# Patient Record
Sex: Female | Born: 1990 | Race: White | Hispanic: No | Marital: Married | Smoking: Former smoker
Health system: Southern US, Community
[De-identification: ages and names within clinical notes are randomized; demographics above are authoritative.]

## PROBLEM LIST (undated history)

## (undated) DIAGNOSIS — F988 Other specified behavioral and emotional disorders with onset usually occurring in childhood and adolescence: Secondary | ICD-10-CM

## (undated) DIAGNOSIS — I10 Essential (primary) hypertension: Secondary | ICD-10-CM

## (undated) DIAGNOSIS — F419 Anxiety disorder, unspecified: Secondary | ICD-10-CM

## (undated) DIAGNOSIS — F32A Depression, unspecified: Secondary | ICD-10-CM

## (undated) DIAGNOSIS — F329 Major depressive disorder, single episode, unspecified: Secondary | ICD-10-CM

## (undated) HISTORY — DX: Other specified behavioral and emotional disorders with onset usually occurring in childhood and adolescence: F98.8

## (undated) HISTORY — DX: Depression, unspecified: F32.A

## (undated) HISTORY — DX: Major depressive disorder, single episode, unspecified: F32.9

## (undated) HISTORY — PX: TONSILLECTOMY: SUR1361

---
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
## 2011-09-22 ENCOUNTER — Emergency Department (HOSPITAL_COMMUNITY)
Admission: EM | Admit: 2011-09-22 | Discharge: 2011-09-23 | Disposition: A | Discharge status: Home / Self Care | Payer: Self-pay | Attending: Emergency Medicine | Admitting: Emergency Medicine

## 2011-09-22 ENCOUNTER — Encounter (HOSPITAL_COMMUNITY): Payer: Self-pay | Admitting: Emergency Medicine

## 2011-09-22 ENCOUNTER — Emergency Department (HOSPITAL_COMMUNITY): Payer: Self-pay

## 2011-09-22 DIAGNOSIS — R197 Diarrhea, unspecified: Secondary | ICD-10-CM | POA: Insufficient documentation

## 2011-09-22 DIAGNOSIS — R42 Dizziness and giddiness: Secondary | ICD-10-CM | POA: Insufficient documentation

## 2011-09-22 DIAGNOSIS — R55 Syncope and collapse: Secondary | ICD-10-CM | POA: Insufficient documentation

## 2011-09-22 LAB — URINALYSIS, ROUTINE W REFLEX MICROSCOPIC
Glucose, UA: NEGATIVE mg/dL
Leukocytes, UA: NEGATIVE
Protein, ur: NEGATIVE mg/dL
pH: 7 (ref 5.0–8.0)

## 2011-09-22 LAB — PREGNANCY, URINE: Preg Test, Ur: NEGATIVE

## 2011-09-22 LAB — CBC
MCHC: 34 g/dL (ref 30.0–36.0)
Platelets: 443 10*3/uL — ABNORMAL HIGH (ref 150–400)
RDW: 13.4 % (ref 11.5–15.5)
WBC: 16 10*3/uL — ABNORMAL HIGH (ref 4.0–10.5)

## 2011-09-22 LAB — DIFFERENTIAL
Basophils Absolute: 0.1 10*3/uL (ref 0.0–0.1)
Basophils Relative: 0 % (ref 0–1)
Lymphocytes Relative: 28 % (ref 12–46)
Neutro Abs: 10.3 10*3/uL — ABNORMAL HIGH (ref 1.7–7.7)

## 2011-09-22 LAB — URINE MICROSCOPIC-ADD ON

## 2011-09-22 LAB — COMPREHENSIVE METABOLIC PANEL
ALT: 12 U/L (ref 0–35)
AST: 15 U/L (ref 0–37)
Albumin: 4 g/dL (ref 3.5–5.2)
CO2: 25 mEq/L (ref 19–32)
Calcium: 9.9 mg/dL (ref 8.4–10.5)
Chloride: 100 mEq/L (ref 96–112)
GFR calc non Af Amer: >90 mL/min (ref >90)
Sodium: 137 mEq/L (ref 135–145)
Total Bilirubin: 0.2 mg/dL — ABNORMAL LOW (ref 0.3–1.2)

## 2011-09-22 MED ORDER — SODIUM CHLORIDE 0.9 % IV BOLUS (SEPSIS)
1000.0000 mL | Freq: Once | INTRAVENOUS | Status: AC
  Administered 2011-09-22: 1000 mL via INTRAVENOUS

## 2011-09-22 MED ORDER — SODIUM CHLORIDE 0.9 % IV SOLN
INTRAVENOUS | Status: DC
  Administered 2011-09-22: 23:00:00 via INTRAVENOUS

## 2011-09-22 MED ORDER — LORAZEPAM 1 MG PO TABS
0.5000 mg | ORAL_TABLET | Freq: Once | ORAL | Status: AC
  Administered 2011-09-22: 0.5 mg via ORAL
  Filled 2011-09-22: qty 1

## 2011-09-22 MED ORDER — POTASSIUM CHLORIDE 20 MEQ/15ML (10%) PO LIQD
40.0000 meq | Freq: Once | ORAL | Status: AC
  Administered 2011-09-22: 40 meq via ORAL
  Filled 2011-09-22: qty 30

## 2011-09-22 MED ORDER — ONDANSETRON HCL 4 MG/2ML IJ SOLN: 4.0000 mg | INTRAMUSCULAR | Status: DC | PRN

## 2011-09-22 NOTE — ED Notes (Signed)
Patient denies nausea at this time. Tolerating fluids well

## 2011-09-22 NOTE — ED Notes (Signed)
Pt presents to the ED with sore throat, near syncope, chills, and diarrhea 30 minutes pta.

## 2011-09-22 NOTE — ED Notes (Signed)
Patient asking if she can get something for lightheadedness. States she hates the way she feels right now.

## 2011-09-22 NOTE — ED Provider Notes (Addendum)
History   This chart was scribed for Laray Anger, DO by Brooks Sailors. The patient was seen in room APA09/APA09.   CSN: [id number]  Arrival date & time 09/22/11  2056   First MD Initiated Contact with Patient 09/22/11 2106      Chief Complaint  Patient presents with  . Chills  . Near Syncope  . Diarrhea    HPI Pt was seen at 2110.   Per pt, c/o gradual onset and persistence of constant lightheadedness, "chills," and "diarrhea" since yesterday.  Has been associated generalized weakness and fatigue.  States she also had a sore throat 3 days ago.  Denies N/V, no syncope, no abd pain, no CP, no cough.     History reviewed. No pertinent past medical history.  Past Surgical History  Procedure Date  . Tonsillectomy     History  Substance Use Topics  . Smoking status: Current Everyday Smoker -- 1.0 packs/day    Types: Cigarettes  . Smokeless tobacco: Not on file  . Alcohol Use: No    Review of Systems ROS: Statement: All systems negative except as marked or noted in the HPI; Constitutional: Negative for fever and +chills. ; ; Eyes: Negative for eye pain, redness and discharge. ; ; ENMT: Negative for ear pain, hoarseness, nasal congestion, sinus pressure and +sore throat. ; ; Cardiovascular: Negative for chest pain, palpitations, diaphoresis, dyspnea and peripheral edema. ; ; Respiratory: Negative for cough, wheezing and stridor. ; ; Gastrointestinal: +diarrhea. Negative for nausea, vomiting, abdominal pain, blood in stool, hematemesis, jaundice and rectal bleeding. . ; ; Genitourinary: Negative for dysuria, flank pain and hematuria. ; ; Musculoskeletal: Negative for back pain and neck pain. Negative for swelling and trauma.; ; Skin: Negative for pruritus, rash, abrasions, blisters, bruising and skin lesion.; ; Neuro: +generalized weakness/fatigue, lightheadedness.  Negative for headache and neck stiffness. Negative for altered level of consciousness , altered mental status,  extremity weakness, paresthesias, involuntary movement, seizure and syncope.      Allergies  Review of patient's allergies indicates no known allergies.  Home Medications  No current outpatient prescriptions on file.  BP 157/108  Pulse 136  Temp(Src) 98.8 F (37.1 C) (Oral)  Resp 20  Ht 5\' 4"  (1.626 m)  Wt 223 lb (101.152 kg)  BMI 38.28 kg/m2  SpO2 100%  LMP 09/20/2011  Physical Exam 2115: Physical examination:  Nursing notes reviewed; Vital signs and O2 SAT reviewed;  Constitutional: Well developed, Well nourished, In no acute distress; Head:  Normocephalic, atraumatic; Eyes: EOMI, PERRL, No scleral icterus; ENMT: Mouth and pharynx normal, Mucous membranes dry; Neck: Supple, Full range of motion, No lymphadenopathy; Cardiovascular: Tachycardic rate and rhythm, No murmur, rub, or gallop; Respiratory: Breath sounds clear & equal bilaterally, No rales, rhonchi, wheezes, or rub, Normal respiratory effort/excursion; Chest: Nontender, Movement normal; Abdomen: Soft, Nontender, Nondistended, Normal bowel sounds; Extremities: Pulses normal, No tenderness, No edema, No calf edema or asymmetry.; Neuro: AA&Ox3, Major CN grossly intact. Speech clear, no facial droop. No gross focal motor or sensory deficits in extremities.; Skin: Color normal, Warm, Dry, no rash.; Psych:  Anxious.    ED Course  Procedures   MDM  MDM Reviewed: nursing note and vitals Interpretation: labs, x-ray and ECG    Date: 09/23/2011  Rate: 121  Rhythm: sinus tachycardia  QRS Axis: normal  Intervals: normal  ST/T Wave abnormalities: normal  Conduction Disutrbances:none  Narrative Interpretation:   Old EKG Reviewed: none available.  Results for orders placed during the hospital  encounter of 09/22/11  CBC      Component Value Range   WBC 16.0 (*) 4.0 - 10.5 (K/uL)   RBC 4.91  3.87 - 5.11 (MIL/uL)   Hemoglobin 13.7  12.0 - 15.0 (g/dL)   HCT 45.4  09.8 - 11.9 (%)   MCV 82.1  78.0 - 100.0 (fL)   MCH 27.9   26.0 - 34.0 (pg)   MCHC 34.0  30.0 - 36.0 (g/dL)   RDW 14.7  82.9 - 56.2 (%)   Platelets 443 (*) 150 - 400 (K/uL)  DIFFERENTIAL      Component Value Range   Neutrophils Relative 65  43 - 77 (%)   Neutro Abs 10.3 (*) 1.7 - 7.7 (K/uL)   Lymphocytes Relative 28  12 - 46 (%)   Lymphs Abs 4.5 (*) 0.7 - 4.0 (K/uL)   Monocytes Relative 6  3 - 12 (%)   Monocytes Absolute 0.9  0.1 - 1.0 (K/uL)   Eosinophils Relative 1  0 - 5 (%)   Eosinophils Absolute 0.2  0.0 - 0.7 (K/uL)   Basophils Relative 0  0 - 1 (%)   Basophils Absolute 0.1  0.0 - 0.1 (K/uL)  COMPREHENSIVE METABOLIC PANEL      Component Value Range   Sodium 137  135 - 145 (mEq/L)   Potassium 3.3 (*) 3.5 - 5.1 (mEq/L)   Chloride 100  96 - 112 (mEq/L)   CO2 25  19 - 32 (mEq/L)   Glucose, Bld 114 (*) 70 - 99 (mg/dL)   BUN 8  6 - 23 (mg/dL)   Creatinine, Ser 1.30  0.50 - 1.10 (mg/dL)   Calcium 9.9  8.4 - 86.5 (mg/dL)   Total Protein 7.6  6.0 - 8.3 (g/dL)   Albumin 4.0  3.5 - 5.2 (g/dL)   AST 15  0 - 37 (U/L)   ALT 12  0 - 35 (U/L)   Alkaline Phosphatase 76  39 - 117 (U/L)   Total Bilirubin 0.2 (*) 0.3 - 1.2 (mg/dL)   GFR calc non Af Amer >90  >90 (mL/min)   GFR calc Af Amer >90  >90 (mL/min)  LIPASE, BLOOD      Component Value Range   Lipase 22  11 - 59 (U/L)  URINALYSIS, ROUTINE W REFLEX MICROSCOPIC      Component Value Range   Color, Urine STRAW (*) YELLOW    APPearance CLEAR  CLEAR    Specific Gravity, Urine 1.010  1.005 - 1.030    pH 7.0  5.0 - 8.0    Glucose, UA NEGATIVE  NEGATIVE (mg/dL)   Hgb urine dipstick MODERATE (*) NEGATIVE    Bilirubin Urine NEGATIVE  NEGATIVE    Ketones, ur NEGATIVE  NEGATIVE (mg/dL)   Protein, ur NEGATIVE  NEGATIVE (mg/dL)   Urobilinogen, UA 0.2  0.0 - 1.0 (mg/dL)   Nitrite NEGATIVE  NEGATIVE    Leukocytes, UA NEGATIVE  NEGATIVE   PREGNANCY, URINE      Component Value Range   Preg Test, Ur NEGATIVE  NEGATIVE   RAPID STREP SCREEN      Component Value Range   Streptococcus, Group A  Screen (Direct) NEGATIVE  NEGATIVE   URINE MICROSCOPIC-ADD ON      Component Value Range   Squamous Epithelial / LPF RARE  RARE    WBC, UA 0-2  <3 (WBC/hpf)   RBC / HPF 0-2  <3 (RBC/hpf)   Dg Chest 2 View 09/22/2011  *RADIOLOGY REPORT*  Clinical Data:  Dizziness, weakness and tachycardia; history of smoking.  CHEST - 2 VIEW  Comparison: None.  Findings: The lungs are well-aerated and clear.  There is no evidence of focal opacification, pleural effusion or pneumothorax.  The heart is normal in size; the mediastinal contour is within normal limits.  No acute osseous abnormalities are seen.  IMPRESSION: No acute cardiopulmonary process seen.  Original Report Authenticated By: Tonia Ghent, M.D.     2300:  Pt continues tachycardic, HR 100-110's, monitor sinus tachycardia, despite IVF x2L.  Pt states she "feels like I'm going to pass out" and "SOB."  Appears anxious, and does admit to feeling "scared."  Will check EKG and CT-A chest r/o PE as cause for tachycardia.     0005:  Pt has tol PO well while in the ED.  Potassium repleted PO.  No stooling while in the ED.  No hypotension, no fevers, and pt is not orthostatic.  CT pending.  Sign out to Dr. Colon Branch.        I personally performed the services described in this documentation, which was scribed in my presence. The recorded information has been reviewed and considered. Rhianon Zabawa Allison Quarry, DO 09/24/11 1610

## 2011-09-23 MED ORDER — IOHEXOL 350 MG/ML SOLN
100.0000 mL | Freq: Once | INTRAVENOUS | Status: AC | PRN
  Administered 2011-09-23: 100 mL via INTRAVENOUS

## 2011-09-23 NOTE — Progress Notes (Signed)
0005 Assumed care/disposition of patient who presented with nausea, diarrhea, anxiety, shortness of breath and persistent tachycardia. Awaiting CTA to r/o PE. 0106 CT negative for PE. Patient is feeling much better. States she does not feel as nervous. Up walking in the room without dizziness. Reviewed results with both the patient and her mother. Patient feels ready for discharge.HR 102.  Dg Chest 2 View  09/22/2011  *RADIOLOGY REPORT*  Clinical Data: Dizziness, weakness and tachycardia; history of smoking.  CHEST - 2 VIEW  Comparison: None.  Findings: The lungs are well-aerated and clear.  There is no evidence of focal opacification, pleural effusion or pneumothorax.  The heart is normal in size; the mediastinal contour is within normal limits.  No acute osseous abnormalities are seen.  IMPRESSION: No acute cardiopulmonary process seen.  Original Report Authenticated By: Tonia Ghent, M.D.   Ct Angio Chest W/cm &/or Wo Cm  09/23/2011  *RADIOLOGY REPORT*  Clinical Data: Shortness of breath.  Near-syncope.  Weakness.  CT ANGIOGRAPHY CHEST  Technique:  Multidetector CT imaging of the chest using the standard protocol during bolus administration of intravenous contrast. Multiplanar reconstructed images including MIPs were obtained and reviewed to evaluate the vascular anatomy.  Contrast: OMNIPAQUE IOHEXOL 350 MG/ML SOLN  Comparison: None.  Findings: Image quality is degraded by technical factors related to the patient's large body habitus.  Although images are noisy, no filling defect can be identified within the opacified pulmonary arteries to suggest the presence of an acute pulmonary embolus. There is no thoracic aortic aneurysm.  No evidence for dissection of the thoracic aorta.  No axillary, mediastinal, or hilar lymphadenopathy.  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  Heart size is normal.  There is no pericardial or pleural effusion.  Lungs are clear  bilaterally.  Bone windows reveal no worrisome lytic or sclerotic osseous lesions.  IMPRESSION: No CT evidence for acute pulmonary embolus.  No findings to explain the patients history of shortness of breath.  Original Report Authenticated By: ERIC A. MANSELL, M.D.

## 2011-09-23 NOTE — Discharge Instructions (Signed)
Your blood work here tonight was normal. Your chest xray and your chest CT did NOT show pneumonia, bronchitis or any evidence of blood clots. Drink lots of fluids. Make position changes slowly if you are feeling dizzy. Use the BRAT diet to help with diarrhea. Follow up with your doctor.   B.R.A.T. Diet Your doctor has recommended the B.R.A.T. diet for you or your child until the condition improves. This is often used to help control diarrhea and vomiting symptoms. If you or your child can tolerate clear liquids, you may have:  Bananas.   Rice.   Applesauce.   Toast (and other simple starches such as crackers, potatoes, noodles).  Be sure to avoid dairy products, meats, and fatty foods until symptoms are better. Fruit juices such as apple, grape, and prune juice can make diarrhea worse. Avoid these. Continue this diet for 2 days or as instructed by your caregiver. Document Released: 05/06/2005 Document Revised: 04/25/2011 Document Reviewed: 10/23/2006 [facility] Patient Information 2012 [place], [place].

## 2011-09-25 ENCOUNTER — Emergency Department (HOSPITAL_COMMUNITY)
Admission: EM | Admit: 2011-09-25 | Discharge: 2011-09-25 | Disposition: A | Discharge status: Home / Self Care | Payer: Self-pay | Attending: Emergency Medicine | Admitting: Emergency Medicine

## 2011-09-25 ENCOUNTER — Encounter (HOSPITAL_COMMUNITY): Payer: Self-pay | Admitting: *Deleted

## 2011-09-25 DIAGNOSIS — F17200 Nicotine dependence, unspecified, uncomplicated: Secondary | ICD-10-CM | POA: Insufficient documentation

## 2011-09-25 DIAGNOSIS — R197 Diarrhea, unspecified: Secondary | ICD-10-CM | POA: Insufficient documentation

## 2011-09-25 DIAGNOSIS — R6883 Chills (without fever): Secondary | ICD-10-CM | POA: Insufficient documentation

## 2011-09-25 DIAGNOSIS — R110 Nausea: Secondary | ICD-10-CM | POA: Insufficient documentation

## 2011-09-25 DIAGNOSIS — R5383 Other fatigue: Secondary | ICD-10-CM | POA: Insufficient documentation

## 2011-09-25 DIAGNOSIS — R5381 Other malaise: Secondary | ICD-10-CM | POA: Insufficient documentation

## 2011-09-25 LAB — CBC
Hemoglobin: 13 g/dL (ref 12.0–15.0)
MCH: 28.3 pg (ref 26.0–34.0)
MCHC: 34.1 g/dL (ref 30.0–36.0)
Platelets: 354 10*3/uL (ref 150–400)
RDW: 13.3 % (ref 11.5–15.5)

## 2011-09-25 LAB — URINALYSIS, ROUTINE W REFLEX MICROSCOPIC
Bilirubin Urine: NEGATIVE
Glucose, UA: NEGATIVE mg/dL
Ketones, ur: NEGATIVE mg/dL
Leukocytes, UA: NEGATIVE
Protein, ur: NEGATIVE mg/dL

## 2011-09-25 LAB — DIFFERENTIAL
Basophils Absolute: 0.1 10*3/uL (ref 0.0–0.1)
Basophils Relative: 1 % (ref 0–1)
Eosinophils Absolute: 0.1 10*3/uL (ref 0.0–0.7)
Neutro Abs: 8.2 10*3/uL — ABNORMAL HIGH (ref 1.7–7.7)
Neutrophils Relative %: 69 % (ref 43–77)

## 2011-09-25 LAB — CLOSTRIDIUM DIFFICILE BY PCR: Toxigenic C. Difficile by PCR: NEGATIVE

## 2011-09-25 LAB — COMPREHENSIVE METABOLIC PANEL
Albumin: 3.9 g/dL (ref 3.5–5.2)
Alkaline Phosphatase: 68 U/L (ref 39–117)
BUN: 8 mg/dL (ref 6–23)
Chloride: 103 mEq/L (ref 96–112)
Potassium: 3.6 mEq/L (ref 3.5–5.1)
Total Bilirubin: 0.2 mg/dL — ABNORMAL LOW (ref 0.3–1.2)

## 2011-09-25 MED ORDER — ONDANSETRON HCL 4 MG/2ML IJ SOLN
4.0000 mg | Freq: Once | INTRAMUSCULAR | Status: AC
  Administered 2011-09-25: 4 mg via INTRAVENOUS
  Filled 2011-09-25: qty 2

## 2011-09-25 MED ORDER — ONDANSETRON HCL 8 MG PO TABS: 8.0000 mg | ORAL_TABLET | Freq: Three times a day (TID) | ORAL | Status: AC | PRN

## 2011-09-25 MED ORDER — SODIUM CHLORIDE 0.9 % IV BOLUS (SEPSIS)
1000.0000 mL | Freq: Once | INTRAVENOUS | Status: AC
  Administered 2011-09-25: 1000 mL via INTRAVENOUS

## 2011-09-25 NOTE — ED Provider Notes (Signed)
History     CSN: [id number]  Arrival date & time 09/25/11  4098   First MD Initiated Contact with Patient 09/25/11 0848      Chief Complaint  Patient presents with  . Nausea    (Consider location/radiation/quality/duration/timing/severity/associated sxs/prior treatment) HPI Comments: Patient returns to ED c/o continued nausea and fatigue.  Patient was seen here three days ago with similar complaints.  States she is still having nausea, chills , fatigue , occasional watery stools and decreased appetite.  She denies fever, vomiting , abd pain, pelvic pain, bloody diarrhea  or dysuria.    The history is provided by the patient.    History reviewed. No pertinent past medical history.  Past Surgical History  Procedure Date  . Tonsillectomy     No family history on file.  History  Substance Use Topics  . Smoking status: Current Everyday Smoker -- 1.0 packs/day    Types: Cigarettes  . Smokeless tobacco: Not on file  . Alcohol Use: No    OB History    Grav Para Term Preterm Abortions TAB SAB Ect Mult Living                  Review of Systems  Constitutional: Positive for chills, appetite change and fatigue. Negative for fever.  HENT: Negative for congestion, sore throat, facial swelling, trouble swallowing, neck pain and neck stiffness.   Respiratory: Negative for chest tightness, shortness of breath and wheezing.   Gastrointestinal: Positive for nausea and diarrhea. Negative for vomiting, abdominal pain and blood in stool.  Genitourinary: Negative for dysuria, hematuria, flank pain, difficulty urinating and pelvic pain.  Musculoskeletal: Negative.   Neurological: Positive for weakness. Negative for dizziness and numbness.  Psychiatric/Behavioral: Negative for confusion and decreased concentration.  All other systems reviewed and are negative.    Allergies  Review of patient's allergies indicates no known allergies.  Home Medications   Current Outpatient Rx  Name  Route Sig Dispense Refill  . ACETAMINOPHEN 500 MG PO TABS Oral Take 1,000 mg by mouth every 4 (four) hours as needed. Pain    . IBUPROFEN 200 MG PO TABS Oral Take 800 mg by mouth every 6 (six) hours as needed. Pain      BP 120/76  Pulse 84  Temp(Src) 98.6 F (37 C) (Oral)  Resp 20  Ht 5\' 3"  (1.6 m)  Wt 223 lb (101.152 kg)  BMI 39.50 kg/m2  SpO2 98%  LMP 09/20/2011  Physical Exam  Nursing note and vitals reviewed. Constitutional: She is oriented to person, place, and time. She appears well-developed and well-nourished. No distress.  HENT:  Head: Normocephalic and atraumatic.  Mouth/Throat: Uvula is midline and oropharynx is clear and moist. Mucous membranes are dry.  Neck: Normal range of motion. Neck supple.  Cardiovascular: Normal rate, regular rhythm and intact distal pulses.   No murmur heard. Pulmonary/Chest: Effort normal and breath sounds normal. No respiratory distress.  Abdominal: Soft. Bowel sounds are normal. She exhibits no distension and no mass. There is no tenderness. There is no rebound and no guarding.  Musculoskeletal: Normal range of motion. She exhibits no edema.  Lymphadenopathy:    She has no cervical adenopathy.  Neurological: She is alert and oriented to person, place, and time. She exhibits normal muscle tone. Coordination normal.  Skin: Skin is warm and dry.    ED Course  Procedures (including critical care time)  Labs Reviewed  CBC - Abnormal; Notable for the following:    WBC 12.0 (*)  All other components within normal limits  DIFFERENTIAL - Abnormal; Notable for the following:    Neutro Abs 8.2 (*)    All other components within normal limits  COMPREHENSIVE METABOLIC PANEL - Abnormal; Notable for the following:    Glucose, Bld 102 (*)    Total Bilirubin 0.2 (*)    All other components within normal limits  URINALYSIS, ROUTINE W REFLEX MICROSCOPIC - Abnormal; Notable for the following:    Hgb urine dipstick SMALL (*)    All other  components within normal limits  POCT PREGNANCY, URINE  URINE MICROSCOPIC-ADD ON  STOOL CULTURE  OVA AND PARASITE EXAMINATION  FECAL LACTOFERRIN  CLOSTRIDIUM DIFFICILE BY PCR      MDM     Previous medical charts, nursing notes and vitals signs from this visit were reviewed by me   All laboratory results and/or imaging results performed on this visit, if applicable, were reviewed by me and discussed with the patient and/or parent as well as recommendation for follow-up    MEDICATIONS GIVEN IN ED: zofran IV    Patient has received IVF's, and anti-emetic. She is feeling better and now requesting food.  Previous ed chart and results were reviewed by me.     Abd remains soft, NT.  No peritoneal signs.  I will also give her referral to  PMD.  symtpoms are likely related to viral infection.       PRESCRIPTIONS GIVEN AT DISCHARGE: zofran     Pt stable in ED with no significant deterioration in condition. Pt feels improved after observation and/or treatment in ED. Patient / Family / Caregiver understand and agree with initial ED impression and plan with expectations set for ED visit.  Patient agrees to return to ED for any worsening symptoms        Leisa Gault L. [place], [place] 09/26/11 2057

## 2011-09-25 NOTE — ED Notes (Signed)
Pt states continued nausea, fatigue, pressure to the head, decreased appetite. Seen here Sunday and was treated for hypokalemia and dehydration. States odors make her extremely nauseated.

## 2011-09-25 NOTE — Discharge Instructions (Signed)
B.R.A.T. Diet Your doctor has recommended the B.R.A.T. diet for you or your child until the condition improves. This is often used to help control diarrhea and vomiting symptoms. If you or your child can tolerate clear liquids, you may have:  Bananas.   Rice.   Applesauce.   Toast (and other simple starches such as crackers, potatoes, noodles).  Be sure to avoid dairy products, meats, and fatty foods until symptoms are better. Fruit juices such as apple, grape, and prune juice can make diarrhea worse. Avoid these. Continue this diet for 2 days or as instructed by your caregiver. Document Released: 05/06/2005 Document Revised: 04/25/2011 Document Reviewed: 10/23/2006 [employer] Patient Information 2012 [employer].Diet for Diarrhea, Adult Having frequent, runny stools (diarrhea) has many causes. Diarrhea may be caused or worsened by food or drink. Diarrhea may be relieved by changing your diet. IF YOU ARE NOT TOLERATING SOLID FOODS:  Drink enough water and fluids to keep your urine clear or pale yellow.   Avoid sugary drinks and sodas as well as milk-based beverages.   Avoid beverages containing caffeine and alcohol.   You may try rehydrating beverages. You can make your own by following this recipe:    tsp table salt.    tsp baking soda.   ? tsp salt substitute (potassium chloride).   1 tbs + 1 tsp sugar.   1 qt water.  As your stools become more solid, you can start eating solid foods. Add foods one at a time. If a certain food causes your diarrhea to get worse, avoid that food and try other foods. A low fiber, low-fat, and lactose-free diet is recommended. Small, frequent meals may be better tolerated.  Starches  Allowed:  White, French, and pita breads, plain rolls, buns, bagels. Plain muffins, matzo. Soda, saltine, or graham crackers. Pretzels, melba toast, zwieback. Cooked cereals made with water: cornmeal, farina, cream cereals. Dry cereals: refined corn, wheat, rice.  Potatoes prepared any way without skins, refined macaroni, spaghetti, noodles, refined rice.   Avoid:  Bread, rolls, or crackers made with whole wheat, multi-grains, rye, bran seeds, nuts, or coconut. Corn tortillas or taco shells. Cereals containing whole grains, multi-grains, bran, coconut, nuts, or raisins. Cooked or dry oatmeal. Coarse wheat cereals, granola. Cereals advertised as "high-fiber." Potato skins. Whole grain pasta, wild or brown rice. Popcorn. Sweet potatoes/yams. Sweet rolls, doughnuts, waffles, pancakes, sweet breads.  Vegetables  Allowed: Strained tomato and vegetable juices. Most well-cooked and canned vegetables without seeds. Fresh: Tender lettuce, cucumber without the skin, cabbage, spinach, bean sprouts.   Avoid: Fresh, cooked, or canned: Artichokes, baked beans, beet greens, broccoli, Brussels sprouts, corn, kale, legumes, peas, sweet potatoes. Cooked: Green or red cabbage, spinach. Avoid large servings of any vegetables, because vegetables shrink when cooked, and they contain more fiber per serving than fresh vegetables.  Fruit  Allowed: All fruit juices except prune juice. Cooked or canned: Apricots, applesauce, cantaloupe, cherries, fruit cocktail, grapefruit, grapes, kiwi, mandarin oranges, peaches, pears, plums, watermelon. Fresh: Apples without skin, ripe banana, grapes, cantaloupe, cherries, grapefruit, peaches, oranges, plums. Keep servings limited to  cup or 1 piece.   Avoid: Fresh: Apple with skin, apricots, mango, pears, raspberries, strawberries. Prune juice, stewed or dried prunes. Dried fruits, raisins, dates. Large servings of all fresh fruits.  Meat and Meat Substitutes  Allowed: Ground or well-cooked tender beef, ham, veal, lamb, pork, or poultry. Eggs, plain cheese. Fish, oysters, shrimp, lobster, other seafoods. Liver, organ meats.   Avoid: Tough, fibrous meats with gristle. Peanut   butter, smooth or chunky. Cheese, nuts, seeds, legumes, dried peas, beans,  lentils.  Milk  Allowed: Yogurt, lactose-free milk, kefir, drinkable yogurt, buttermilk, soy milk.   Avoid: Milk, chocolate milk, beverages made with milk, such as milk shakes.  Soups  Allowed: Bouillon, broth, or soups made from allowed foods. Any strained soup.   Avoid: Soups made from vegetables that are not allowed, cream or milk-based soups.  Desserts and Sweets  Allowed: Sugar-free gelatin, sugar-free frozen ice pops made without sugar alcohol.   Avoid: Plain cakes and cookies, pie made with allowed fruit, pudding, custard, cream pie. Gelatin, fruit, ice, sherbet, frozen ice pops. Ice cream, ice milk without nuts. Plain hard candy, honey, jelly, molasses, syrup, sugar, chocolate syrup, gumdrops, marshmallows.  Fats and Oils  Allowed: Avoid any fats and oils.   Avoid: Seeds, nuts, olives, avocados. Margarine, butter, cream, mayonnaise, salad oils, plain salad dressings made from allowed foods. Plain gravy, crisp bacon without rind.  Beverages  Allowed: Water, decaffeinated teas, oral rehydration solutions, sugar-free beverages.   Avoid: Fruit juices, caffeinated beverages (coffee, tea, soda or pop), alcohol, sports drinks, or lemon-lime soda or pop.  Condiments  Allowed: Ketchup, mustard, horseradish, vinegar, cream sauce, cheese sauce, cocoa powder. Spices in moderation: allspice, basil, bay leaves, celery powder or leaves, cinnamon, cumin powder, curry powder, ginger, mace, marjoram, onion or garlic powder, oregano, paprika, parsley flakes, ground pepper, rosemary, sage, savory, tarragon, thyme, turmeric.   Avoid: Coconut, honey.  Weight Monitoring: Weigh yourself every day. You should weigh yourself in the morning after you urinate and before you eat breakfast. Wear the same amount of clothing when you weigh yourself. Record your weight daily. Bring your recorded weights to your clinic visits. Tell your caregiver right away if you have gained 3 lb/1.4 kg or more in 1 day, 5  lb/2.3 kg in a week, or whatever amount you were told to report. SEEK IMMEDIATE MEDICAL CARE IF:   You are unable to keep fluids down.   You start to throw up (vomit) or diarrhea keeps coming back (persistent).   Abdominal pain develops, increases, or can be felt in one place (localizes).   You have an oral temperature above 102 F (38.9 C), not controlled by medicine.   Diarrhea contains blood or mucus.   You develop excessive weakness, dizziness, fainting, or extreme thirst.  MAKE SURE YOU:   Understand these instructions.   Will watch your condition.   Will get help right away if you are not doing well or get worse.  Document Released: 07/27/2003 Document Revised: 04/25/2011 Document Reviewed: 11/17/2008 [employer] Patient Information 2012 [employer]. 

## 2011-09-25 NOTE — ED Notes (Signed)
Received report from Corena Herter, RN. Assessment unchanged. Pt states decreased nausea. Pt resting comfortably in bed with family at bedside

## 2011-09-27 LAB — OVA AND PARASITE EXAMINATION: Ova and parasites: NONE SEEN

## 2011-09-27 NOTE — ED Provider Notes (Signed)
Medical screening examination/treatment/procedure(s) were performed by non-physician practitioner and as supervising physician I was immediately available for consultation/collaboration. Brandom Kerwin, MD, FACEP   Dhruvan Gullion L Ioanna Colquhoun, MD 09/27/11 1455 

## 2011-09-29 LAB — STOOL CULTURE

## 2012-04-07 ENCOUNTER — Emergency Department: Payer: Self-pay | Admitting: Emergency Medicine

## 2013-02-13 ENCOUNTER — Ambulatory Visit (HOSPITAL_COMMUNITY)
Admit: 2013-02-13 | Discharge: 2013-02-13 | Disposition: A | Discharge status: Home / Self Care | Payer: Medicaid Other | Source: Ambulatory Visit | Attending: Emergency Medicine | Admitting: Emergency Medicine

## 2013-02-13 ENCOUNTER — Emergency Department (HOSPITAL_COMMUNITY)
Admission: EM | Admit: 2013-02-13 | Discharge: 2013-02-13 | Disposition: A | Discharge status: Home / Self Care | Payer: Medicaid Other | Attending: Emergency Medicine | Admitting: Emergency Medicine

## 2013-02-13 ENCOUNTER — Encounter (HOSPITAL_COMMUNITY): Payer: Self-pay

## 2013-02-13 DIAGNOSIS — K8020 Calculus of gallbladder without cholecystitis without obstruction: Secondary | ICD-10-CM | POA: Insufficient documentation

## 2013-02-13 DIAGNOSIS — R1011 Right upper quadrant pain: Secondary | ICD-10-CM

## 2013-02-13 DIAGNOSIS — K8000 Calculus of gallbladder with acute cholecystitis without obstruction: Secondary | ICD-10-CM

## 2013-02-13 DIAGNOSIS — K8050 Calculus of bile duct without cholangitis or cholecystitis without obstruction: Secondary | ICD-10-CM

## 2013-02-13 DIAGNOSIS — Z3202 Encounter for pregnancy test, result negative: Secondary | ICD-10-CM | POA: Insufficient documentation

## 2013-02-13 DIAGNOSIS — R110 Nausea: Secondary | ICD-10-CM | POA: Insufficient documentation

## 2013-02-13 DIAGNOSIS — Z87891 Personal history of nicotine dependence: Secondary | ICD-10-CM | POA: Insufficient documentation

## 2013-02-13 LAB — COMPREHENSIVE METABOLIC PANEL
ALT: 19 U/L (ref 0–35)
Albumin: 3.5 g/dL (ref 3.5–5.2)
Alkaline Phosphatase: 69 U/L (ref 39–117)
BUN: 9 mg/dL (ref 6–23)
Calcium: 9.3 mg/dL (ref 8.4–10.5)
Potassium: 3.9 mEq/L (ref 3.5–5.1)
Sodium: 137 mEq/L (ref 135–145)
Total Protein: 7.2 g/dL (ref 6.0–8.3)

## 2013-02-13 LAB — CBC WITH DIFFERENTIAL/PLATELET
Basophils Relative: 0 % (ref 0–1)
HCT: 35.9 % — ABNORMAL LOW (ref 36.0–46.0)
Hemoglobin: 11.7 g/dL — ABNORMAL LOW (ref 12.0–15.0)
Lymphs Abs: 3.1 10*3/uL (ref 0.7–4.0)
MCHC: 32.6 g/dL (ref 30.0–36.0)
Monocytes Absolute: 0.8 10*3/uL (ref 0.1–1.0)
Monocytes Relative: 6 % (ref 3–12)
Neutro Abs: 8.3 10*3/uL — ABNORMAL HIGH (ref 1.7–7.7)
RBC: 4.45 MIL/uL (ref 3.87–5.11)

## 2013-02-13 LAB — URINALYSIS, ROUTINE W REFLEX MICROSCOPIC
Leukocytes, UA: NEGATIVE
Nitrite: NEGATIVE
Specific Gravity, Urine: 1.01 (ref 1.005–1.030)
Urobilinogen, UA: 0.2 mg/dL (ref 0.0–1.0)
pH: 7 (ref 5.0–8.0)

## 2013-02-13 LAB — LIPASE, BLOOD: Lipase: 23 U/L (ref 11–59)

## 2013-02-13 MED ORDER — HYDROCODONE-ACETAMINOPHEN 5-325 MG PO TABS: 1.0000 | ORAL_TABLET | Freq: Four times a day (QID) | ORAL | Status: DC | PRN

## 2013-02-13 MED ORDER — ONDANSETRON 8 MG PO TBDP: 8.0000 mg | ORAL_TABLET | Freq: Three times a day (TID) | ORAL | Status: DC | PRN

## 2013-02-13 NOTE — ED Provider Notes (Signed)
CSN: [id number]     Arrival date & time 02/13/13  0148 History   First MD Initiated Contact with Patient 02/13/13 0319     Chief Complaint  Patient presents with  . Abdominal Pain   (Consider location/radiation/quality/duration/timing/severity/associated sxs/prior Treatment) HPI This is a 22 year old female with several month history of abdominal pain. The pain usually occurs in the late evenings. She states it feels like a band across her upper abdomen. It is worse with palpation. It is not normally associated with nausea or vomiting but she did have some nausea with this episode. She had some diarrhea yesterday but is not sure if it is related. She has not taken anything to treat this. This is her most severe episode to date with pain that was severe but has improved it is now mild.  History reviewed. No pertinent past medical history. Past Surgical History  Procedure Laterality Date  . Tonsillectomy     No family history on file. History  Substance Use Topics  . Smoking status: Former Smoker -- 1.00 packs/day    Types: Cigarettes  . Smokeless tobacco: Not on file  . Alcohol Use: No   OB History   Grav Para Term Preterm Abortions TAB SAB Ect Mult Living                 Review of Systems  All other systems reviewed and are negative.    Allergies  Review of patient's allergies indicates no known allergies.  Home Medications   Current Outpatient Rx  Name  Route  Sig  Dispense  Refill  . acetaminophen (TYLENOL) 500 MG tablet   Oral   Take 1,000 mg by mouth every 4 (four) hours as needed. Pain         . ibuprofen (ADVIL,MOTRIN) 200 MG tablet   Oral   Take 800 mg by mouth every 6 (six) hours as needed. Pain          BP 135/79  Pulse 74  Temp(Src) 98.7 F (37.1 C) (Oral)  Resp 18  Ht 5\' 4"  (1.626 m)  Wt 230 lb (104.327 kg)  BMI 39.46 kg/m2  SpO2 99%  LMP 12/13/2012  Physical Exam General: Well-developed, well-nourished female in no acute distress;  appearance consistent with age of record HENT: normocephalic; atraumatic Eyes: pupils equal, round and reactive to light; extraocular muscles intact Neck: supple Heart: regular rate and rhythm; no murmurs, rubs or gallops Lungs: clear to auscultation bilaterally Abdomen: soft; nondistended; right upper quadrant tenderness; no masses or hepatosplenomegaly; bowel sounds present; gallstones seen on bedside ultrasound Extremities: No deformity; full range of motion; pulses normal Neurologic: Awake, alert and oriented; motor function intact in all extremities and symmetric; no facial droop Skin: Warm and dry Psychiatric: Normal mood and affect    ED Course  Procedures (including critical care time) Labs Review  MDM   Nursing notes and vitals signs, including pulse oximetry, reviewed.  Summary of this visit's results, reviewed by myself:  Labs:  Results for orders placed during the hospital encounter of 02/13/13 (from the past 24 hour(s))  URINALYSIS, ROUTINE W REFLEX MICROSCOPIC     Status: None   Collection Time    02/13/13  2:24 AM      Result Value Range   Color, Urine YELLOW  YELLOW   APPearance CLEAR  CLEAR   Specific Gravity, Urine 1.010  1.005 - 1.030   pH 7.0  5.0 - 8.0   Glucose, UA NEGATIVE  NEGATIVE mg/dL  Hgb urine dipstick NEGATIVE  NEGATIVE   Bilirubin Urine NEGATIVE  NEGATIVE   Ketones, ur NEGATIVE  NEGATIVE mg/dL   Protein, ur NEGATIVE  NEGATIVE mg/dL   Urobilinogen, UA 0.2  0.0 - 1.0 mg/dL   Nitrite NEGATIVE  NEGATIVE   Leukocytes, UA NEGATIVE  NEGATIVE  POCT PREGNANCY, URINE     Status: None   Collection Time    02/13/13  2:28 AM      Result Value Range   Preg Test, Ur NEGATIVE  NEGATIVE  COMPREHENSIVE METABOLIC PANEL     Status: Abnormal   Collection Time    02/13/13  3:48 AM      Result Value Range   Sodium 137  135 - 145 mEq/L   Potassium 3.9  3.5 - 5.1 mEq/L   Chloride 102  96 - 112 mEq/L   CO2 28  19 - 32 mEq/L   Glucose, Bld 116 (*) 70 - 99  mg/dL   BUN 9  6 - 23 mg/dL   Creatinine, Ser 1.61  0.50 - 1.10 mg/dL   Calcium 9.3  8.4 - 09.6 mg/dL   Total Protein 7.2  6.0 - 8.3 g/dL   Albumin 3.5  3.5 - 5.2 g/dL   AST 17  0 - 37 U/L   ALT 19  0 - 35 U/L   Alkaline Phosphatase 69  39 - 117 U/L   Total Bilirubin 0.1 (*) 0.3 - 1.2 mg/dL   GFR calc non Af Amer >90  >90 mL/min   GFR calc Af Amer >90  >90 mL/min  LIPASE, BLOOD     Status: None   Collection Time    02/13/13  3:48 AM      Result Value Range   Lipase 23  11 - 59 U/L  CBC WITH DIFFERENTIAL     Status: Abnormal   Collection Time    02/13/13  3:48 AM      Result Value Range   WBC 12.4 (*) 4.0 - 10.5 K/uL   RBC 4.45  3.87 - 5.11 MIL/uL   Hemoglobin 11.7 (*) 12.0 - 15.0 g/dL   HCT 04.5 (*) 40.9 - 81.1 %   MCV 80.7  78.0 - 100.0 fL   MCH 26.3  26.0 - 34.0 pg   MCHC 32.6  30.0 - 36.0 g/dL   RDW 91.4  78.2 - 95.6 %   Platelets 376  150 - 400 K/uL   Neutrophils Relative % 67  43 - 77 %   Neutro Abs 8.3 (*) 1.7 - 7.7 K/uL   Lymphocytes Relative 25  12 - 46 %   Lymphs Abs 3.1  0.7 - 4.0 K/uL   Monocytes Relative 6  3 - 12 %   Monocytes Absolute 0.8  0.1 - 1.0 K/uL   Eosinophils Relative 2  0 - 5 %   Eosinophils Absolute 0.3  0.0 - 0.7 K/uL   Basophils Relative 0  0 - 1 %   Basophils Absolute 0.0  0.0 - 0.1 K/uL   4:33 AM Patient pain-free at this time with minimal right upper quadrant tenderness. History and exam are consistent with biliary colic. We will have her return later this morning for a formal ultrasound     Hanley Seamen, MD 02/13/13 [phone]

## 2013-02-13 NOTE — ED Provider Notes (Signed)
Medical screening examination/treatment/procedure(s) were performed by non-physician practitioner and as supervising physician I was immediately available for consultation/collaboration. Devoria Albe, MD, FACEP   Ward Givens, MD 02/13/13 [phone]

## 2013-02-13 NOTE — ED Provider Notes (Signed)
Jillian Mcpherson is a 22 y.o. female who returns to the ED for follow up ultrasound for her right upper quadrant abdominal pain. She was evaluated last night and scheduled to return this am.  US Abdomen Limited Ruq  02/13/2013   CLINICAL DATA:  Right upper quadrant abdominal pain.  EXAM: US ABDOMEN LIMITED - RIGHT UPPER QUADRANT  COMPARISON:  Visualized upper abdomen on prior CTA chest 09/23/2011.  FINDINGS: Gallbladder:  Contracted, thick-walled gallbladder containing several shadowing gallstones, the largest stone approximating 1.8 cm. Gallbladder wall thickness approximates 5 mm. No pericholecystic fluid. Negative sonographic Murphy sign according to the ultrasound technologist.  Common bile duct  Diameter: 4 mm. No visible bile duct stones.  Liver:  Normal size and echotexture without focal hepatic parenchymal abnormality. Patent portal vein with hepatopetal flow.  IMPRESSION: 1. Cholelithiasis. Gallbladder wall thickening. Absence of sonographic Murphy sign may indicate chronic cholecystitis. 2. No biliary ductal dilation. No visible bile duct stones. 3. Normal-appearing liver by ultrasound. Son [employer]   Electronically Signed   By: Hulan Saas   On: 02/13/2013 11:21    Will refer patient to general surgeon for follow up. She will return here if symptoms worsen. Today she is pain free. I discussed results of the ultrasound and plan of care with the patient. She voices understanding.   Janne Napoleon, [place] 02/13/13 1251

## 2013-02-13 NOTE — ED Notes (Signed)
Pt c/o pain to upper abd since last night with nausea

## 2013-07-27 ENCOUNTER — Other Ambulatory Visit: Payer: Self-pay | Admitting: Obstetrics & Gynecology

## 2013-07-27 DIAGNOSIS — O0930 Supervision of pregnancy with insufficient antenatal care, unspecified trimester: Secondary | ICD-10-CM

## 2013-07-28 ENCOUNTER — Ambulatory Visit (INDEPENDENT_AMBULATORY_CARE_PROVIDER_SITE_OTHER): Payer: Medicaid Other

## 2013-07-28 ENCOUNTER — Encounter (INDEPENDENT_AMBULATORY_CARE_PROVIDER_SITE_OTHER): Payer: Self-pay

## 2013-07-28 ENCOUNTER — Other Ambulatory Visit: Payer: Self-pay | Admitting: Obstetrics & Gynecology

## 2013-07-28 DIAGNOSIS — F1920 Other psychoactive substance dependence, uncomplicated: Secondary | ICD-10-CM

## 2013-07-28 DIAGNOSIS — O99320 Drug use complicating pregnancy, unspecified trimester: Secondary | ICD-10-CM

## 2013-07-28 DIAGNOSIS — O0930 Supervision of pregnancy with insufficient antenatal care, unspecified trimester: Secondary | ICD-10-CM

## 2013-07-28 NOTE — Progress Notes (Signed)
U/S-single active fetus, meas c/w 22+6wks EDD 11/25/2013, cx appears closed (3.3cm), bilateral adnexa wnl, female fetus "Jillian Mcpherson", anterior/fundal Gr 1 placenta, fluid wnl, no obvious abnl noted although unable to view the profile/NB of fetus, would like to reck anatomy

## 2013-08-09 ENCOUNTER — Encounter: Payer: Self-pay | Admitting: Women's Health

## 2013-08-09 ENCOUNTER — Other Ambulatory Visit (HOSPITAL_COMMUNITY)
Admission: RE | Admit: 2013-08-09 | Discharge: 2013-08-09 | Disposition: A | Discharge status: Home / Self Care | Payer: Medicaid Other | Source: Ambulatory Visit | Attending: Obstetrics & Gynecology | Admitting: Obstetrics & Gynecology

## 2013-08-09 ENCOUNTER — Ambulatory Visit (INDEPENDENT_AMBULATORY_CARE_PROVIDER_SITE_OTHER): Payer: Medicaid Other | Admitting: Women's Health

## 2013-08-09 VITALS — BP 106/64 | Wt 242.8 lb

## 2013-08-09 DIAGNOSIS — Z01419 Encounter for gynecological examination (general) (routine) without abnormal findings: Secondary | ICD-10-CM

## 2013-08-09 DIAGNOSIS — Z3400 Encounter for supervision of normal first pregnancy, unspecified trimester: Secondary | ICD-10-CM | POA: Insufficient documentation

## 2013-08-09 DIAGNOSIS — O99019 Anemia complicating pregnancy, unspecified trimester: Secondary | ICD-10-CM

## 2013-08-09 DIAGNOSIS — O0930 Supervision of pregnancy with insufficient antenatal care, unspecified trimester: Secondary | ICD-10-CM | POA: Insufficient documentation

## 2013-08-09 DIAGNOSIS — Z1389 Encounter for screening for other disorder: Secondary | ICD-10-CM

## 2013-08-09 DIAGNOSIS — Z331 Pregnant state, incidental: Secondary | ICD-10-CM

## 2013-08-09 LAB — CBC
HCT: 32.5 % — ABNORMAL LOW (ref 36.0–46.0)
Hemoglobin: 11.1 g/dL — ABNORMAL LOW (ref 12.0–15.0)
MCH: 27.4 pg (ref 26.0–34.0)
MCHC: 34.2 g/dL (ref 30.0–36.0)
MCV: 80.2 fL (ref 78.0–100.0)
PLATELETS: 400 10*3/uL (ref 150–400)
RBC: 4.05 MIL/uL (ref 3.87–5.11)
RDW: 14 % (ref 11.5–15.5)
WBC: 15.5 10*3/uL — ABNORMAL HIGH (ref 4.0–10.5)

## 2013-08-09 LAB — POCT URINALYSIS DIPSTICK
Blood, UA: NEGATIVE
Glucose, UA: NEGATIVE
Ketones, UA: NEGATIVE
LEUKOCYTES UA: NEGATIVE
NITRITE UA: NEGATIVE
PROTEIN UA: NEGATIVE

## 2013-08-09 LAB — RPR

## 2013-08-09 MED ORDER — OB COMPLETE WITH DHA 28-1.25-200 MG PO CAPS: 1.0000 | ORAL_CAPSULE | Freq: Every day | ORAL | Status: DC

## 2013-08-09 NOTE — Addendum Note (Signed)
Addended by: Criss AlvinePULLIAM, Karas Pickerill G on: 08/09/2013 12:19 PM   Modules accepted: Orders

## 2013-08-09 NOTE — Patient Instructions (Signed)
You will have your sugar test next visit.  Please do not eat or drink anything after midnight the night before you come, not even water.  You will be here for at least two hours.     Second Trimester of Pregnancy The second trimester is from week 13 through week 28, months 4 through 6. The second trimester is often a time when you feel your best. Your body has also adjusted to being pregnant, and you begin to feel better physically. Usually, morning sickness has lessened or quit completely, you may have more energy, and you may have an increase in appetite. The second trimester is also a time when the fetus is growing rapidly. At the end of the sixth month, the fetus is about 9 inches long and weighs about 1 pounds. You will likely begin to feel the baby move (quickening) between 18 and 20 weeks of the pregnancy. BODY CHANGES Your body goes through many changes during pregnancy. The changes vary from woman to woman.   Your weight will continue to increase. You will notice your lower abdomen bulging out.  You may begin to get stretch marks on your hips, abdomen, and breasts.  You may develop headaches that can be relieved by medicines approved by your caregiver.  You may urinate more often because the fetus is pressing on your bladder.  You may develop or continue to have heartburn as a result of your pregnancy.  You may develop constipation because certain hormones are causing the muscles that push waste through your intestines to slow down.  You may develop hemorrhoids or swollen, bulging veins (varicose veins).  You may have back pain because of the weight gain and pregnancy hormones relaxing your joints between the bones in your pelvis and as a result of a shift in weight and the muscles that support your balance.  Your breasts will continue to grow and be tender.  Your gums may bleed and may be sensitive to brushing and flossing.  Dark spots or blotches (chloasma, mask of pregnancy)  may develop on your face. This will likely fade after the baby is born.  A dark line from your belly button to the pubic area (linea nigra) may appear. This will likely fade after the baby is born. WHAT TO EXPECT AT YOUR PRENATAL VISITS During a routine prenatal visit:  You will be weighed to make sure you and the fetus are growing normally.  Your blood pressure will be taken.  Your abdomen will be measured to track your baby's growth.  The fetal heartbeat will be listened to.  Any test results from the previous visit will be discussed. Your caregiver may ask you:  How you are feeling.  If you are feeling the baby move.  If you have had any abnormal symptoms, such as leaking fluid, bleeding, severe headaches, or abdominal cramping.  If you have any questions. Other tests that may be performed during your second trimester include:  Blood tests that check for:  Low iron levels (anemia).  Gestational diabetes (between 24 and 28 weeks).  Rh antibodies.  Urine tests to check for infections, diabetes, or protein in the urine.  An ultrasound to confirm the proper growth and development of the baby.  An amniocentesis to check for possible genetic problems.  Fetal screens for spina bifida and Down syndrome. HOME CARE INSTRUCTIONS   Avoid all smoking, herbs, alcohol, and unprescribed drugs. These chemicals affect the formation and growth of the baby.  Follow your caregiver's   instructions regarding medicine use. There are medicines that are either safe or unsafe to take during pregnancy.  Exercise only as directed by your caregiver. Experiencing uterine cramps is a good sign to stop exercising.  Continue to eat regular, healthy meals.  Wear a good support bra for breast tenderness.  Do not use hot tubs, steam rooms, or saunas.  Wear your seat belt at all times when driving.  Avoid raw meat, uncooked cheese, cat litter boxes, and soil used by cats. These carry germs that  can cause birth defects in the baby.  Take your prenatal vitamins.  Try taking a stool softener (if your caregiver approves) if you develop constipation. Eat more high-fiber foods, such as fresh vegetables or fruit and whole grains. Drink plenty of fluids to keep your urine clear or pale yellow.  Take warm sitz baths to soothe any pain or discomfort caused by hemorrhoids. Use hemorrhoid cream if your caregiver approves.  If you develop varicose veins, wear support hose. Elevate your feet for 15 minutes, 3 4 times a day. Limit salt in your diet.  Avoid heavy lifting, wear low heel shoes, and practice good posture.  Rest with your legs elevated if you have leg cramps or low back pain.  Visit your dentist if you have not gone yet during your pregnancy. Use a soft toothbrush to brush your teeth and be gentle when you floss.  A sexual relationship may be continued unless your caregiver directs you otherwise.  Continue to go to all your prenatal visits as directed by your caregiver. SEEK MEDICAL CARE IF:   You have dizziness.  You have mild pelvic cramps, pelvic pressure, or nagging pain in the abdominal area.  You have persistent nausea, vomiting, or diarrhea.  You have a bad smelling vaginal discharge.  You have pain with urination. SEEK IMMEDIATE MEDICAL CARE IF:   You have a fever.  You are leaking fluid from your vagina.  You have spotting or bleeding from your vagina.  You have severe abdominal cramping or pain.  You have rapid weight gain or loss.  You have shortness of breath with chest pain.  You notice sudden or extreme swelling of your face, hands, ankles, feet, or legs.  You have not felt your baby move in over an hour.  You have severe headaches that do not go away with medicine.  You have vision changes. Document Released: 04/30/2001 Document Revised: 01/06/2013 Document Reviewed: 07/07/2012 Exit[employer] Patient Information 2014 [employer].  

## 2013-08-09 NOTE — Progress Notes (Signed)
  Subjective:  Jillian Mcpherson is a 23 y.o. G1P0 Caucasian female at 4139w4d by 22.6wk u/s, being seen today for her first obstetrical visit.  Her obstetrical history is significant for obesity and primigravida, late care @ 24.4 wks.  H/O depression during teen years- took zoloft, no meds now and doing great. Pregnancy history fully reviewed.  Patient reports fatigue. Denies vb, cramping, uti s/s, abnormal/malodorous vag d/c, or vulvovaginal itching/irritation.  BP 106/64  Wt 242 lb 12.8 oz (110.133 kg)  LMP 12/13/2012  HISTORY: OB History  Gravida Para Term Preterm AB SAB TAB Ectopic Multiple Living  1             # Outcome Date GA Lbr Len/2nd Weight Sex Delivery Anes PTL Lv  1 CUR              Past Medical History  Diagnosis Date  . Depression    Past Surgical History  Procedure Laterality Date  . Tonsillectomy     Family History  Problem Relation Age of Onset  . Hypertension Mother   . Heart disease Mother   . Hypertension Father   . Asthma Father   . Heart disease Maternal Grandmother   . COPD Paternal Grandmother   . Cancer Other     great grandmother    Exam   System:     General: Well developed & nourished, no acute distress   Skin: Warm & dry, normal coloration and turgor, no rashes   Neurologic: Alert & oriented, normal mood   Cardiovascular: Regular rate & rhythm   Respiratory: Effort & rate normal, LCTAB, acyanotic   Abdomen: Soft, non tender   Extremities: normal strength, tone   Pelvic Exam:    Perineum: Normal perineum   Vulva: Normal, no lesions   Vagina:  Normal mucosa, normal discharge   Cervix: Normal, bulbous, appears closed   Uterus: Normal size/shape/contour for GA   Thin prep pap smear obtained w/ reflex high risk HPV cotesting FHR: 162 via doppler   Assessment:   Pregnancy: G1P0 Patient Active Problem List   Diagnosis Date Noted  . Supervision of normal first pregnancy 08/09/2013    Priority: High  . Late prenatal care complicating  pregnancy 08/09/2013    Priority: High    5839w4d G1P0 New OB visit H/O depression Late prenatal care   Plan:  Initial labs drawn Continue prenatal vitamins, rx ob complete per pt preference Problem list reviewed and updated Reviewed n/v relief measures and warning s/s to report Reviewed recommended weight gain based on pre-gravid BMI Encouraged well-balanced diet Genetic Screening discussed Quad Screen: requested Cystic fibrosis screening discussed requested Ultrasound discussed; fetal survey: results reviewed Follow up in 4 weeks for pn2, f/u anatomy u/s d/t suboptimal views, and visit [facility] completed  Marge DuncansBooker, Yanelli Zapanta Randall CNM, [facility] Center[facility]NP-BC 08/09/2013 10:46 AM

## 2013-08-09 NOTE — Addendum Note (Signed)
Addended by: Shawna ClampBOOKER, Carnesha Maravilla R on: 08/09/2013 11:17 AM   Modules accepted: Orders

## 2013-08-10 ENCOUNTER — Encounter: Payer: Self-pay | Admitting: Women's Health

## 2013-08-10 LAB — DRUG SCREEN, URINE, NO CONFIRMATION
Amphetamine Screen, Ur: NEGATIVE
Barbiturate Quant, Ur: NEGATIVE
Benzodiazepines.: NEGATIVE
COCAINE METABOLITES: NEGATIVE
CREATININE, U: 246.1 mg/dL
MARIJUANA METABOLITE: NEGATIVE
Methadone: NEGATIVE
OPIATE SCREEN, URINE: NEGATIVE
PHENCYCLIDINE (PCP): NEGATIVE
Propoxyphene: NEGATIVE

## 2013-08-10 LAB — RUBELLA SCREEN: Rubella: 5.28 Index — ABNORMAL HIGH (ref <0.90)

## 2013-08-10 LAB — ABO AND RH: Rh Type: POSITIVE

## 2013-08-10 LAB — URINALYSIS, ROUTINE W REFLEX MICROSCOPIC
Bilirubin Urine: NEGATIVE
Glucose, UA: 100 mg/dL — AB
Hgb urine dipstick: NEGATIVE
KETONES UR: NEGATIVE mg/dL
Leukocytes, UA: NEGATIVE
NITRITE: NEGATIVE
PH: 6 (ref 5.0–8.0)
Protein, ur: NEGATIVE mg/dL
SPECIFIC GRAVITY, URINE: 1.027 (ref 1.005–1.030)
Urobilinogen, UA: 1 mg/dL (ref 0.0–1.0)

## 2013-08-10 LAB — ANTIBODY SCREEN: Antibody Screen: NEGATIVE

## 2013-08-10 LAB — HIV ANTIBODY (ROUTINE TESTING W REFLEX): HIV: NONREACTIVE

## 2013-08-10 LAB — VARICELLA ZOSTER ANTIBODY, IGG: Varicella IgG: 242.8 Index — ABNORMAL HIGH (ref <135.00)

## 2013-08-10 LAB — HEPATITIS B SURFACE ANTIGEN: Hepatitis B Surface Ag: NEGATIVE

## 2013-08-10 LAB — OXYCODONE SCREEN, UA, RFLX CONFIRM: OXYCODONE SCRN UR: NEGATIVE ng/mL

## 2013-08-11 LAB — URINE CULTURE

## 2013-08-11 LAB — CYSTIC FIBROSIS DIAGNOSTIC STUDY

## 2013-08-16 ENCOUNTER — Encounter: Payer: Self-pay | Admitting: Women's Health

## 2013-09-06 ENCOUNTER — Ambulatory Visit (INDEPENDENT_AMBULATORY_CARE_PROVIDER_SITE_OTHER): Payer: Medicaid Other | Admitting: Obstetrics & Gynecology

## 2013-09-06 ENCOUNTER — Ambulatory Visit (INDEPENDENT_AMBULATORY_CARE_PROVIDER_SITE_OTHER): Payer: Medicaid Other

## 2013-09-06 ENCOUNTER — Other Ambulatory Visit: Payer: Self-pay | Admitting: Women's Health

## 2013-09-06 ENCOUNTER — Other Ambulatory Visit: Payer: Medicaid Other

## 2013-09-06 ENCOUNTER — Encounter: Payer: Self-pay | Admitting: Obstetrics & Gynecology

## 2013-09-06 VITALS — BP 130/80 | Wt 243.0 lb

## 2013-09-06 DIAGNOSIS — O358XX0 Maternal care for other (suspected) fetal abnormality and damage, not applicable or unspecified: Secondary | ICD-10-CM

## 2013-09-06 DIAGNOSIS — O0930 Supervision of pregnancy with insufficient antenatal care, unspecified trimester: Secondary | ICD-10-CM

## 2013-09-06 DIAGNOSIS — O99340 Other mental disorders complicating pregnancy, unspecified trimester: Secondary | ICD-10-CM

## 2013-09-06 DIAGNOSIS — Z3400 Encounter for supervision of normal first pregnancy, unspecified trimester: Secondary | ICD-10-CM

## 2013-09-06 DIAGNOSIS — Z331 Pregnant state, incidental: Secondary | ICD-10-CM

## 2013-09-06 DIAGNOSIS — O99019 Anemia complicating pregnancy, unspecified trimester: Secondary | ICD-10-CM

## 2013-09-06 DIAGNOSIS — Z1389 Encounter for screening for other disorder: Secondary | ICD-10-CM

## 2013-09-06 LAB — CBC
HEMATOCRIT: 31.1 % — AB (ref 36.0–46.0)
HEMOGLOBIN: 10.6 g/dL — AB (ref 12.0–15.0)
MCH: 27.1 pg (ref 26.0–34.0)
MCHC: 34.1 g/dL (ref 30.0–36.0)
MCV: 79.5 fL (ref 78.0–100.0)
Platelets: 393 10*3/uL (ref 150–400)
RBC: 3.91 MIL/uL (ref 3.87–5.11)
RDW: 13.6 % (ref 11.5–15.5)
WBC: 15.8 10*3/uL — ABNORMAL HIGH (ref 4.0–10.5)

## 2013-09-06 LAB — POCT URINALYSIS DIPSTICK
Blood, UA: NEGATIVE
Glucose, UA: NEGATIVE
KETONES UA: NEGATIVE
Leukocytes, UA: NEGATIVE
Nitrite, UA: NEGATIVE
Protein, UA: NEGATIVE

## 2013-09-06 NOTE — Progress Notes (Signed)
U/S(28+wks)-breech fetus, approp growth EFW 2 lb 12 oz (51st%tile), fluid wnl AFI-12.3cm SDP-2.6cm, FHR- 136 BPM, anterior Gr 2 placenta, cx appears closed (3.1cm)

## 2013-09-06 NOTE — Progress Notes (Signed)
See sonogram report  BP weight and urine results all reviewed and noted. Patient reports good fetal movement, denies any bleeding and no rupture of membranes symptoms or regular contractions. Patient is without complaints. All questions were answered.

## 2013-09-07 ENCOUNTER — Encounter: Payer: Self-pay | Admitting: Obstetrics & Gynecology

## 2013-09-07 LAB — GC/CHLAMYDIA PROBE AMP
CT Probe RNA: NEGATIVE
GC Probe RNA: NEGATIVE

## 2013-09-07 LAB — HIV ANTIBODY (ROUTINE TESTING W REFLEX): HIV 1&2 Ab, 4th Generation: NONREACTIVE

## 2013-09-07 LAB — RPR

## 2013-09-07 LAB — GLUCOSE TOLERANCE, 2 HOURS W/ 1HR
GLUCOSE, 2 HOUR: 129 mg/dL (ref 70–139)
Glucose, 1 hour: 138 mg/dL (ref 70–170)
Glucose, Fasting: 92 mg/dL (ref 70–99)

## 2013-09-07 LAB — HSV 2 ANTIBODY, IGG

## 2013-09-07 LAB — ANTIBODY SCREEN: Antibody Screen: NEGATIVE

## 2013-09-15 ENCOUNTER — Encounter: Payer: Self-pay | Admitting: Women's Health

## 2013-09-27 ENCOUNTER — Encounter: Payer: Self-pay | Admitting: Obstetrics and Gynecology

## 2013-09-27 ENCOUNTER — Ambulatory Visit (INDEPENDENT_AMBULATORY_CARE_PROVIDER_SITE_OTHER): Payer: Medicaid Other | Admitting: Obstetrics and Gynecology

## 2013-09-27 VITALS — BP 124/84 | Wt 247.0 lb

## 2013-09-27 DIAGNOSIS — Z3400 Encounter for supervision of normal first pregnancy, unspecified trimester: Secondary | ICD-10-CM

## 2013-09-27 DIAGNOSIS — O0930 Supervision of pregnancy with insufficient antenatal care, unspecified trimester: Secondary | ICD-10-CM

## 2013-09-27 DIAGNOSIS — Z331 Pregnant state, incidental: Secondary | ICD-10-CM

## 2013-09-27 DIAGNOSIS — O99019 Anemia complicating pregnancy, unspecified trimester: Secondary | ICD-10-CM

## 2013-09-27 DIAGNOSIS — O99340 Other mental disorders complicating pregnancy, unspecified trimester: Secondary | ICD-10-CM

## 2013-09-27 DIAGNOSIS — Z1389 Encounter for screening for other disorder: Secondary | ICD-10-CM

## 2013-09-27 LAB — POCT URINALYSIS DIPSTICK
Blood, UA: NEGATIVE
GLUCOSE UA: NEGATIVE
Ketones, UA: NEGATIVE
Leukocytes, UA: NEGATIVE
NITRITE UA: NEGATIVE
PROTEIN UA: NEGATIVE

## 2013-09-27 NOTE — Progress Notes (Signed)
5450w4d G1P0 presents for routine prenatal visit today. She also complains of vaginal discharge and inability to stay "fresh". She reports noticing an odor that she has never smelled before. She denies any vaginal bleeding. She has no other complaints. Childbirth classes discussed at length. Pt notes breast changes.  KOH negative for yeast Pelvic Exam:Vagina: Normal vaginal secretions  Cervix: long and closed A normal changes of pregnancy. NO infection seen

## 2013-09-27 NOTE — Patient Instructions (Addendum)
Please check out [email or web address]   for more information on childbirth classes   

## 2013-10-25 ENCOUNTER — Ambulatory Visit (INDEPENDENT_AMBULATORY_CARE_PROVIDER_SITE_OTHER): Payer: Medicaid Other | Admitting: Obstetrics and Gynecology

## 2013-10-25 ENCOUNTER — Encounter: Payer: Self-pay | Admitting: Obstetrics and Gynecology

## 2013-10-25 VITALS — BP 138/78 | Wt 252.2 lb

## 2013-10-25 DIAGNOSIS — Z3400 Encounter for supervision of normal first pregnancy, unspecified trimester: Secondary | ICD-10-CM

## 2013-10-25 DIAGNOSIS — Z1389 Encounter for screening for other disorder: Secondary | ICD-10-CM

## 2013-10-25 DIAGNOSIS — Z331 Pregnant state, incidental: Secondary | ICD-10-CM

## 2013-10-25 LAB — POCT URINALYSIS DIPSTICK
Blood, UA: NEGATIVE
Glucose, UA: NEGATIVE
KETONES UA: NEGATIVE
LEUKOCYTES UA: NEGATIVE
Nitrite, UA: NEGATIVE
Protein, UA: NEGATIVE

## 2013-10-25 NOTE — Progress Notes (Signed)
[redacted]w[redacted]d G1P0 presents for routine prenatal visit today. She denies vaginal bleeding or discharge. She has no other complaints at this time.  P:gbs next wk

## 2013-11-01 ENCOUNTER — Ambulatory Visit (INDEPENDENT_AMBULATORY_CARE_PROVIDER_SITE_OTHER): Payer: Medicaid Other | Admitting: Women's Health

## 2013-11-01 ENCOUNTER — Encounter: Payer: Self-pay | Admitting: Women's Health

## 2013-11-01 VITALS — BP 132/82 | Wt 254.0 lb

## 2013-11-01 DIAGNOSIS — O321XX0 Maternal care for breech presentation, not applicable or unspecified: Secondary | ICD-10-CM | POA: Insufficient documentation

## 2013-11-01 DIAGNOSIS — Z3400 Encounter for supervision of normal first pregnancy, unspecified trimester: Secondary | ICD-10-CM

## 2013-11-01 DIAGNOSIS — Z1389 Encounter for screening for other disorder: Secondary | ICD-10-CM

## 2013-11-01 DIAGNOSIS — Z331 Pregnant state, incidental: Secondary | ICD-10-CM

## 2013-11-01 LAB — POCT URINALYSIS DIPSTICK
Glucose, UA: NEGATIVE
KETONES UA: NEGATIVE
Leukocytes, UA: NEGATIVE
Nitrite, UA: NEGATIVE
PROTEIN UA: NEGATIVE
RBC UA: NEGATIVE

## 2013-11-01 NOTE — Patient Instructions (Addendum)
[place] Pediatricians:  [facility] [phone]            [facility] [phone]                 [facility] [phone] (usually doesn't accept new patients unless you have family there already, you are always welcome to call and ask)             [facility] ([address] [place]) [phone]   [facility]:   [facility]: [phone]  [facility]: [phone]   [facility] [place] [place]  [phone] External Cephalic Version (ECV) External cephalic version is turning a baby that is presenting their buttocks first (breech) or is lying sideways in the uterus (transverse) to a head-first position. This makes the labor and delivery faster, safer for the mother and baby, and lessens the chance for a Cesarean section. It should not be tried until the pregnancy is [redacted] weeks along or longer. BEFORE THE PROCEDURE   Do not take aspirin.  Do not eat for 4 hours before the procedure.  Tell your caregiver if you have a cold, fever or an infection.  Tell your caregiver if you are having contractions.  Tell your caregiver if you are leaking or had a gush of fluid from your vagina.  Tell your caregiver if you have any vaginal bleeding or abnormal discharge.  If you are being admitted the same day, arrive at the hospital at least one hour before the procedure to sign any necessary documents and to get prepared for the procedure.  Tell your caregiver if you had any problems with anesthetics in the past.  Tell your caregiver if you are taking any medications that your caregiver does not know about. This includes over-the-counter and prescription drugs, herbs, eye drops and creams. PROCEDURE  First, an ultrasound is done to make sure the baby is breech or transverse.  A non-stress test or biophysical profile is done on the baby before the ECV. This is done to make sure it is  safe for the baby to have the ECV. It may also be done after the procedure to make sure the baby is OK.  ECV is done in the delivery/surgical room with an anesthesiologist present. There should be a setup for an emergency Cesarean section with a full nursing and nursery staff available and ready.  The patient may be given a medication to relax the uterine muscles. An epidural may be given for any discomfort. It is helpful for the success of the ECV.  An electronic fetal monitor is placed on the uterus during the procedure to make sure the baby is OK.  If the mother is Rh negative, Rho-gam will be given to her to prevent Rh problems for future pregnancies.  The mother is followed closely for 2 to 3 hours after the procedure to make sure no problems develop. BENEFITS OF ECV  Easier and safer labor and delivery for the mother and baby.  Lower incidence of Cesarean section.  Lower costs with a vaginal delivery. RISKS OF ECV  The placenta pulls away from the wall of the uterus before delivery (abruption of the placenta).  Rupture of the uterus, especially in patients with a previous Cesarean section.  Fetal distress.  Early (premature) labor.  Premature rupture of the membranes.  The baby will return to the breech or transverse lie position.  Death of the fetus can happen, but is very rare. ECV  SHOULD BE STOPPED IF:  The fetal heart tones drop.  The mother is having a lot of pain.  You cannot turn the baby after several attempts. ECV SHOULD NOT BE DONE IF:  The non-stress test or biophysical profile is abnormal.  There is vaginal bleeding.  An abnormal shaped uterus is present.  There is heart disease or uncontrolled high blood pressure in the mother.  There are twins or more.  The placenta covers the opening of the cervix (placenta previa).  You had a previous cesarean section with a classical incision or major surgery of the uterus.  There is not enough amniotic  fluid in the sac (oligohydramnios).  The baby is too small for the pregnancy or has not developed normally (anomaly).  Your membranes have ruptured. HOME CARE INSTRUCTIONS   Have someone take you home after the procedure.  Rest at home for several hours.  Have someone stay with you for a few hours after you get home.  After ECV, continue with your prenatal visits as directed.  Continue your regular diet, rest and activities.  Do not do any strenuous activities for a couple of days. SEEK IMMEDIATE MEDICAL CARE IF:   You develop vaginal bleeding.  You have fluid coming out of your vagina (bag of water may have broken).  You develop uterine contractions.  You do not feel the baby move or there is less movement of the baby.  You develop abdominal pain.  You develop an oral temperature of 102 F (38.9 C) or higher. Document Released: 10/29/2006 Document Revised: 07/29/2011 Document Reviewed: 08/24/2008 [facility] Patient Information 2014 [place], [place].

## 2013-11-01 NOTE — Progress Notes (Signed)
Low-risk OB appointment G1P0 9731w4d Estimated Date of Delivery: 11/25/13 Blood pressure 132/82, weight 254 lb (115.214 kg), last menstrual period 12/13/2012.  BP, weight, and urine results all reviewed and noted.  Please refer to the obstetrical flow sheet for the fundal height and fetal heart rate documentation. Reports good fm.  Denies regular uc's, lof, vb, or uti s/s. No complaints. Feeling kicks lower abdomen.  SVE: closed/soft/thinning, presentation undeterminable Informal bs u/s: Breech, confirmed by JVF Discussed ECV, r/b, printed info given Reviewed ptl s/s, fkc. Plan:  Call me in am if she decides for ECV so can be scheduled for Thurs am w/ JVF.  F/U in 1wk for OB appointment

## 2013-11-02 ENCOUNTER — Encounter: Payer: Self-pay | Admitting: Women's Health

## 2013-11-02 ENCOUNTER — Telehealth: Payer: Self-pay | Admitting: Obstetrics and Gynecology

## 2013-11-02 LAB — GC/CHLAMYDIA PROBE AMP
CT Probe RNA: NEGATIVE
GC Probe RNA: NEGATIVE

## 2013-11-02 NOTE — Telephone Encounter (Signed)
Breech yesterday, discussed ECV vs. PLTCS at that time, she has called back and has decided she wants to schedule PLTCS and maybe 'baby will turn on it's own'. Discussed moxibustion, [email or web address], diving into deep end of pool as things she can do to try to get baby to turn to vtx if she would like. Will route to JVF so he can schedule c/s if needed. Cheral MarkerKimberly R. Booker, CNM, [facility] 11/02/2013 12:13 PM

## 2013-11-03 ENCOUNTER — Encounter: Payer: Self-pay | Admitting: Women's Health

## 2013-11-03 LAB — STREP B DNA PROBE: GBSP: NOT DETECTED

## 2013-11-07 ENCOUNTER — Encounter (HOSPITAL_COMMUNITY): Payer: Self-pay | Admitting: Emergency Medicine

## 2013-11-07 ENCOUNTER — Emergency Department (HOSPITAL_COMMUNITY)
Admission: EM | Admit: 2013-11-07 | Discharge: 2013-11-07 | Disposition: A | Discharge status: Home / Self Care | Payer: Medicaid Other | Attending: Emergency Medicine | Admitting: Emergency Medicine

## 2013-11-07 DIAGNOSIS — Z87891 Personal history of nicotine dependence: Secondary | ICD-10-CM | POA: Insufficient documentation

## 2013-11-07 DIAGNOSIS — K0889 Other specified disorders of teeth and supporting structures: Secondary | ICD-10-CM

## 2013-11-07 DIAGNOSIS — K089 Disorder of teeth and supporting structures, unspecified: Secondary | ICD-10-CM | POA: Insufficient documentation

## 2013-11-07 DIAGNOSIS — Z8659 Personal history of other mental and behavioral disorders: Secondary | ICD-10-CM | POA: Insufficient documentation

## 2013-11-07 DIAGNOSIS — Z9089 Acquired absence of other organs: Secondary | ICD-10-CM | POA: Insufficient documentation

## 2013-11-07 MED ORDER — AMOXICILLIN 250 MG PO CAPS
500.0000 mg | ORAL_CAPSULE | Freq: Once | ORAL | Status: AC
  Administered 2013-11-07: 500 mg via ORAL
  Filled 2013-11-07: qty 2

## 2013-11-07 MED ORDER — AMOXICILLIN 500 MG PO CAPS: 500.0000 mg | ORAL_CAPSULE | Freq: Three times a day (TID) | ORAL | Status: DC

## 2013-11-07 NOTE — ED Provider Notes (Signed)
CSN: [id number]     Arrival date & time 11/07/13  0612 History   First MD Initiated Contact with Patient 11/07/13 [phone]     Chief Complaint  Patient presents with  . Dental Pain     Patient is a 23 y.o. female presenting with tooth pain. The history is provided by the patient.  Dental Pain Location:  Generalized Severity:  Moderate Onset quality:  Gradual Duration: several months. Timing:  Constant Progression:  Worsening Chronicity:  Chronic Relieved by:  Nothing Worsened by:  Jaw movement Associated symptoms: no fever   pt reports dental pain for awhile, worse since last night No fever/vomiting She reports most of her pain is on left side of face No fever/vomiting No weakness/dizziness No cp/sob No severe HA or visual changes No new LE edema  She is currently in 3rd trimester of pregnancy, no complications thus far +fetal movement No vag bleeding/discharge No contractions  Past Medical History  Diagnosis Date  . Depression    Past Surgical History  Procedure Laterality Date  . Tonsillectomy     Family History  Problem Relation Age of Onset  . Hypertension Mother   . Heart disease Mother   . Hypertension Father   . Asthma Father   . Heart disease Maternal Grandmother   . COPD Paternal Grandmother   . Cancer Other     great grandmother   History  Substance Use Topics  . Smoking status: Former Smoker -- 1.00 packs/day    Types: Cigarettes  . Smokeless tobacco: Never Used  . Alcohol Use: No   OB History   Grav Para Term Preterm Abortions TAB SAB Ect Mult Living   1              Review of Systems  Constitutional: Negative for fever.  Genitourinary: Negative for vaginal bleeding.      Allergies  Review of patient's allergies indicates no known allergies.  Home Medications   Prior to Admission medications   Medication Sig Start Date End Date Taking? Authorizing Provider  acetaminophen (TYLENOL) 500 MG tablet Take 1,000 mg by mouth every 4  (four) hours as needed. Pain   Yes Historical Provider, MD  Prenatal Vit-Fe Fum-FA-Omega (OB COMPLETE WITH DHA) 28-1.25-200 MG CAPS Take 1 tablet by mouth daily. 08/09/13  Yes Cheron EveryKimberly Randall Booker, CNM   BP 144/88  Pulse 80  Temp(Src) 98.7 F (37.1 C) (Oral)  Resp 18  Ht 5\' 4"  (1.626 m)  Wt 252 lb (114.306 kg)  BMI 43.23 kg/m2  SpO2 99%  LMP 12/13/2012 Physical Exam CONSTITUTIONAL: Well developed/well nourished HEAD: Normocephalic/atraumatic EYES: EOMI/PERRL ENMT: Mucous membranes moist, poor dentition.  No focal abscess noted.  Tenderness diffusely in her lower molars NECK: supple no meningeal signs CV: S1/S2 noted, no murmurs/rubs/gallops noted LUNGS: Lungs are clear to auscultation bilaterally, no apparent distress ABDOMEN: soft, nontender, no rebound or guarding, gravid NEURO: Pt is awake/alert, moves all extremitiesx4 EXTREMITIES: pulses normal, full ROM, no LE edema noted SKIN: warm, color normal PSYCH: no abnormalities of mood noted  ED Course  Procedures   Limitations for pain control as patient is 37 weeks Will start antibiotics Recheck of BP is improved and similar to prior readings at her OBGYN office.  She denies HA/visual changes and no new LE edema.     MDM   Final diagnoses:  Pain, dental    Nursing notes including past medical history and social history reviewed and considered in documentation Previous records reviewed and considered  Joya Gaskinsonald W Wickline, MD 11/07/13 [id number]

## 2013-11-07 NOTE — ED Notes (Signed)
Patient c/o upper and lower left teeth pain.  Patient states has been going on for years and is scheduled to have 4 teeth pulled after delivery of baby in July.

## 2013-11-07 NOTE — Discharge Instructions (Signed)
Dental Pain  Toothache is pain in or around a tooth. It may get worse with chewing or with cold or heat.   HOME CARE  · Your dentist may use a numbing medicine during treatment. If so, you may need to avoid eating until the medicine wears off. Ask your dentist about this.  · Only take medicine as told by your dentist or doctor.  · Avoid chewing food near the painful tooth until after all treatment is done. Ask your dentist about this.  GET HELP RIGHT AWAY IF:   · The problem gets worse or new problems appear.  · You have a fever.  · There is redness and puffiness (swelling) of the face, jaw, or neck.  · You cannot open your mouth.  · There is pain in the jaw.  · There is very bad pain that is not helped by medicine.  MAKE SURE YOU:   · Understand these instructions.  · Will watch your condition.  · Will get help right away if you are not doing well or get worse.  Document Released: 10/23/2007 Document Revised: 07/29/2011 Document Reviewed: 10/23/2007  ExitCare® Patient Information ©2015 [employer]. This information is not intended to replace advice given to you by your health care provider. Make sure you discuss any questions you have with your health care provider.

## 2013-11-07 NOTE — ED Notes (Signed)
Fetal heart tones audible via doppler; 129 bpm

## 2013-11-08 ENCOUNTER — Other Ambulatory Visit: Payer: Self-pay | Admitting: Obstetrics and Gynecology

## 2013-11-08 ENCOUNTER — Ambulatory Visit (INDEPENDENT_AMBULATORY_CARE_PROVIDER_SITE_OTHER): Payer: Medicaid Other | Admitting: Obstetrics and Gynecology

## 2013-11-08 ENCOUNTER — Encounter: Payer: Self-pay | Admitting: Obstetrics and Gynecology

## 2013-11-08 VITALS — BP 130/80 | Wt 253.0 lb

## 2013-11-08 DIAGNOSIS — Z3400 Encounter for supervision of normal first pregnancy, unspecified trimester: Secondary | ICD-10-CM

## 2013-11-08 DIAGNOSIS — Z331 Pregnant state, incidental: Secondary | ICD-10-CM

## 2013-11-08 DIAGNOSIS — O321XX1 Maternal care for breech presentation, fetus 1: Secondary | ICD-10-CM

## 2013-11-08 DIAGNOSIS — Z3403 Encounter for supervision of normal first pregnancy, third trimester: Secondary | ICD-10-CM

## 2013-11-08 DIAGNOSIS — Z1389 Encounter for screening for other disorder: Secondary | ICD-10-CM

## 2013-11-08 LAB — POCT URINALYSIS DIPSTICK
Blood, UA: NEGATIVE
GLUCOSE UA: NEGATIVE
KETONES UA: NEGATIVE
LEUKOCYTES UA: NEGATIVE
Nitrite, UA: NEGATIVE
Protein, UA: NEGATIVE

## 2013-11-08 NOTE — Patient Instructions (Addendum)
Please check out [email or web address]-baby-[email or web address]-[email or web address]-classes/   for more information on childbirth classes    Fetal Movement Counts Patient Name: __________________________________________________ Patient Due Date: ____________________ Performing a fetal movement count is highly recommended in high-risk pregnancies, but it is good for every pregnant woman to do. Your caregiver may ask you to start counting fetal movements at 28 weeks of the pregnancy. Fetal movements often increase:  After eating a full meal.  After physical activity.  After eating or drinking something sweet or cold.  At rest. Pay attention to when you feel the baby is most active. This will help you notice a pattern of your baby's sleep and wake cycles and what factors contribute to an increase in fetal movement. It is important to perform a fetal movement count at the same time each day when your baby is normally most active.  HOW TO COUNT FETAL MOVEMENTS 1. Find a quiet and comfortable area to sit or lie down on your left side. Lying on your left side provides the best blood and oxygen circulation to your baby. 2. Write down the day and time on a sheet of paper or in a journal. 3. Start counting kicks, flutters, swishes, rolls, or jabs in a 2 hour period. You should feel at least 10 movements within 2 hours. 4. If you do not feel 10 movements in 2 hours, wait 2-3 hours and count again. Look for a change in the pattern or not enough counts in 2 hours. SEEK MEDICAL CARE IF:  You feel less than 10 counts in 2 hours, tried twice.  There is no movement in over an hour.  The pattern is changing or taking longer each day to reach 10 counts in 2 hours.  You feel the baby is not moving as he or she usually does. Date: ____________ Movements: ____________ Start time: ____________ Doreatha MartinFinish time: ____________  Date: ____________ Movements: ____________ Start time:  ____________ Doreatha MartinFinish time: ____________ Date: ____________ Movements: ____________ Start time: ____________ Doreatha MartinFinish time: ____________ Date: ____________ Movements: ____________ Start time: ____________ Doreatha MartinFinish time: ____________ Date: ____________ Movements: ____________ Start time: ____________ Doreatha MartinFinish time: ____________ Date: ____________ Movements: ____________ Start time: ____________ Doreatha MartinFinish time: ____________ Date: ____________ Movements: ____________ Start time: ____________ Doreatha MartinFinish time: ____________ Date: ____________ Movements: ____________ Start time: ____________ Doreatha MartinFinish time: ____________  Date: ____________ Movements: ____________ Start time: ____________ Doreatha MartinFinish time: ____________ Date: ____________ Movements: ____________ Start time: ____________ Doreatha MartinFinish time: ____________ Date: ____________ Movements: ____________ Start time: ____________ Doreatha MartinFinish time: ____________ Date: ____________ Movements: ____________ Start time: ____________ Doreatha MartinFinish time: ____________ Date: ____________ Movements: ____________ Start time: ____________ Doreatha MartinFinish time: ____________ Date: ____________ Movements: ____________ Start time: ____________ Doreatha MartinFinish time: ____________ Date: ____________ Movements: ____________ Start time: ____________ Doreatha MartinFinish time: ____________  Date: ____________ Movements: ____________ Start time: ____________ Doreatha MartinFinish time: ____________ Date: ____________ Movements: ____________ Start time: ____________ Doreatha MartinFinish time: ____________ Date: ____________ Movements: ____________ Start time: ____________ Doreatha MartinFinish time: ____________ Date: ____________ Movements: ____________ Start time: ____________ Doreatha MartinFinish time: ____________ Date: ____________ Movements: ____________ Start time: ____________ Doreatha MartinFinish time: ____________ Date: ____________ Movements: ____________ Start time: ____________ Doreatha MartinFinish time: ____________ Date: ____________ Movements: ____________ Start time: ____________ Doreatha MartinFinish time: ____________  Date:  ____________ Movements: ____________ Start time: ____________ Doreatha MartinFinish time: ____________ Date: ____________ Movements: ____________ Start time: ____________ Doreatha MartinFinish time: ____________ Date: ____________ Movements: ____________ Start time: ____________ Doreatha MartinFinish time: ____________ Date: ____________ Movements: ____________ Start time: ____________ Doreatha MartinFinish time: ____________ Date: ____________ Movements: ____________ Start time: ____________ Doreatha MartinFinish time: ____________ Date: ____________ Movements: ____________ Start time: ____________ Doreatha MartinFinish time: ____________ Date: ____________ Movements: ____________ Start  time: ____________ Doreatha MartinFinish time: ____________  Date: ____________ Movements: ____________ Start time: ____________ Doreatha MartinFinish time: ____________ Date: ____________ Movements: ____________ Start time: ____________ Doreatha MartinFinish time: ____________ Date: ____________ Movements: ____________ Start time: ____________ Doreatha MartinFinish time: ____________ Date: ____________ Movements: ____________ Start time: ____________ Doreatha MartinFinish time: ____________ Date: ____________ Movements: ____________ Start time: ____________ Doreatha MartinFinish time: ____________ Date: ____________ Movements: ____________ Start time: ____________ Doreatha MartinFinish time: ____________ Date: ____________ Movements: ____________ Start time: ____________ Doreatha MartinFinish time: ____________  Date: ____________ Movements: ____________ Start time: ____________ Doreatha MartinFinish time: ____________ Date: ____________ Movements: ____________ Start time: ____________ Doreatha MartinFinish time: ____________ Date: ____________ Movements: ____________ Start time: ____________ Doreatha MartinFinish time: ____________ Date: ____________ Movements: ____________ Start time: ____________ Doreatha MartinFinish time: ____________ Date: ____________ Movements: ____________ Start time: ____________ Doreatha MartinFinish time: ____________ Date: ____________ Movements: ____________ Start time: ____________ Doreatha MartinFinish time: ____________ Date: ____________ Movements: ____________ Start  time: ____________ Doreatha MartinFinish time: ____________  Date: ____________ Movements: ____________ Start time: ____________ Doreatha MartinFinish time: ____________ Date: ____________ Movements: ____________ Start time: ____________ Doreatha MartinFinish time: ____________ Date: ____________ Movements: ____________ Start time: ____________ Doreatha MartinFinish time: ____________ Date: ____________ Movements: ____________ Start time: ____________ Doreatha MartinFinish time: ____________ Date: ____________ Movements: ____________ Start time: ____________ Doreatha MartinFinish time: ____________ Date: ____________ Movements: ____________ Start time: ____________ Doreatha MartinFinish time: ____________ Date: ____________ Movements: ____________ Start time: ____________ Doreatha MartinFinish time: ____________  Date: ____________ Movements: ____________ Start time: ____________ Doreatha MartinFinish time: ____________ Date: ____________ Movements: ____________ Start time: ____________ Doreatha MartinFinish time: ____________ Date: ____________ Movements: ____________ Start time: ____________ Doreatha MartinFinish time: ____________ Date: ____________ Movements: ____________ Start time: ____________ Doreatha MartinFinish time: ____________ Date: ____________ Movements: ____________ Start time: ____________ Doreatha MartinFinish time: ____________ Date: ____________ Movements: ____________ Start time: ____________ Doreatha MartinFinish time: ____________ Document Released: 06/05/2006 Document Revised: 04/22/2012 Document Reviewed: 03/02/2012 ExitCare Patient Information 2015 [employer]. This information is not intended to replace advice given to you by your health care provider. Make sure you discuss any questions you have with your health care provider.

## 2013-11-08 NOTE — Progress Notes (Signed)
Pt denies any problems or concerns at this time.  

## 2013-11-08 NOTE — Progress Notes (Signed)
G1P0 [id number] Estimated Date of Delivery: 11/25/13  Blood pressure 130/80, weight 253 lb (114.76 kg), last menstrual period 12/13/2012.   BP weight and urine results all reviewed and noted.  Please refer to the obstetrical flow sheet for the fundal height and fetal heart rate documentation:  Patient reports good fetal movement, denies any bleeding and no rupture of membranes symptoms or regular contractions. Patient is without complaints.  Bedside ultrasound shows breech presentation  Plan:  Continued routine obstetrical care, primary cesarean at 39 wk 0 days, July 2 , or thereafter.  Follow up in 1 weeks for OB appointment, with Dr Despina HiddenEure, to be s

## 2013-11-09 ENCOUNTER — Encounter (HOSPITAL_COMMUNITY): Payer: Self-pay

## 2013-11-15 ENCOUNTER — Encounter (HOSPITAL_COMMUNITY): Payer: Self-pay | Admitting: Pharmacy Technician

## 2013-11-15 ENCOUNTER — Encounter (INDEPENDENT_AMBULATORY_CARE_PROVIDER_SITE_OTHER): Payer: Medicaid Other | Admitting: Obstetrics & Gynecology

## 2013-11-15 ENCOUNTER — Encounter: Payer: Self-pay | Admitting: Obstetrics & Gynecology

## 2013-11-15 VITALS — BP 130/90 | Wt 254.4 lb

## 2013-11-15 DIAGNOSIS — Z331 Pregnant state, incidental: Secondary | ICD-10-CM

## 2013-11-15 DIAGNOSIS — Z1389 Encounter for screening for other disorder: Secondary | ICD-10-CM

## 2013-11-15 DIAGNOSIS — O321XX0 Maternal care for breech presentation, not applicable or unspecified: Secondary | ICD-10-CM | POA: Insufficient documentation

## 2013-11-15 DIAGNOSIS — O321XX1 Maternal care for breech presentation, fetus 1: Secondary | ICD-10-CM

## 2013-11-15 LAB — POCT URINALYSIS DIPSTICK
Blood, UA: NEGATIVE
Glucose, UA: NEGATIVE
Ketones, UA: NEGATIVE
Nitrite, UA: NEGATIVE
PROTEIN UA: NEGATIVE

## 2013-11-15 NOTE — Progress Notes (Signed)
G1P0 [redacted]w[redacted]d Estimated Date of Delivery: 11/25/13  Blood pressure 130/90, weight 254 lb 6.4 oz (115.395 kg), last menstrual period 12/13/2012.   BP weight and urine results all reviewed and noted.  Please refer to the obstetrical flow sheet for the fundal height and fetal heart rate documentation:  Patient reports good fetal movement, denies any bleeding and no rupture of membranes symptoms or regular contractions. Patient is without complaints. All questions were answered.  Plan:  Continued routine obstetrical care, section scheduled for 11/18/2013  Follow up in 1 weeks for post op  Preoperative History and Physical  Jillian Mcpherson is a 23 y.o. G1P0 with Patient's last menstrual period was 12/13/2012. admitted for a .    PMH:    Past Medical History  Diagnosis Date  . Depression     PSH:     Past Surgical History  Procedure Laterality Date  . Tonsillectomy      POb/GynH:      OB History   Grav Para Term Preterm Abortions TAB SAB Ect Mult Living   1               SH:   History  Substance Use Topics  . Smoking status: Former Smoker -- 1.00 packs/day    Types: Cigarettes  . Smokeless tobacco: Never Used  . Alcohol Use: No    FH:    Family History  Problem Relation Age of Onset  . Hypertension Mother   . Heart disease Mother   . Hypertension Father   . Asthma Father   . Heart disease Maternal Grandmother   . COPD Paternal Grandmother   . Cancer Other     great grandmother     Allergies: No Known Allergies  Medications:      Current outpatient prescriptions:amoxicillin (AMOXIL) 500 MG capsule, Take 1 capsule (500 mg total) by mouth 3 (three) times daily., Disp: 21 capsule, Rfl: 0;  Prenatal Vit-Fe Fum-FA-Omega (OB COMPLETE WITH DHA) 28-1.25-200 MG CAPS, Take 1 tablet by mouth daily., Disp: 30 each, Rfl: 12;  acetaminophen (TYLENOL) 500 MG tablet, Take 1,000 mg by mouth every 4 (four) hours as needed. Pain, Disp: , Rfl:   Review of Systems:   Review of Systems   Constitutional: Negative for fever, chills, weight loss, malaise/fatigue and diaphoresis.  HENT: Negative for hearing loss, ear pain, nosebleeds, congestion, sore throat, neck pain, tinnitus and ear discharge.   Eyes: Negative for blurred vision, double vision, photophobia, pain, discharge and redness.  Respiratory: Negative for cough, hemoptysis, sputum production, shortness of breath, wheezing and stridor.   Cardiovascular: Negative for chest pain, palpitations, orthopnea, claudication, leg swelling and PND.  Gastrointestinal: Positive for abdominal pain. Negative for heartburn, nausea, vomiting, diarrhea, constipation, blood in stool and melena.  Genitourinary: Negative for dysuria, urgency, frequency, hematuria and flank pain.  Musculoskeletal: Negative for myalgias, back pain, joint pain and falls.  Skin: Negative for itching and rash.  Neurological: Negative for dizziness, tingling, tremors, sensory change, speech change, focal weakness, seizures, loss of consciousness, weakness and headaches.  Endo/Heme/Allergies: Negative for environmental allergies and polydipsia. Does not bruise/bleed easily.  Psychiatric/Behavioral: Negative for depression, suicidal ideas, hallucinations, memory loss and substance abuse. The patient is not nervous/anxious and does not have insomnia.      PHYSICAL EXAM:  Blood pressure 130/90, weight 254 lb 6.4 oz (115.395 kg), last menstrual period 12/13/2012.    Vitals reviewed. Constitutional: She is oriented to person, place, and time. She appears well-developed and well-nourished.  HENT:  Head: Normocephalic and atraumatic.  Right Ear: External ear normal.  Left Ear: External ear normal.  Nose: Nose normal.  Mouth/Throat: Oropharynx is clear and moist.  Eyes: Conjunctivae and EOM are normal. Pupils are equal, round, and reactive to light. Right eye exhibits no discharge. Left eye exhibits no discharge. No scleral icterus.  Neck: Normal range of motion.  Neck supple. No tracheal deviation present. No thyromegaly present.  Cardiovascular: Normal rate, regular rhythm, normal heart sounds and intact distal pulses.  Exam reveals no gallop and no friction rub.   No murmur heard. Respiratory: Effort normal and breath sounds normal. No respiratory distress. She has no wheezes. She has no rales. She exhibits no tenderness.  GI: Soft. Bowel sounds are normal. She exhibits no distension and no mass. There is tenderness. There is no rebound and no guarding.  Genitourinary:       Vulva is normal without lesions Vagina is pink moist without discharge Cervix normal in appearance and pap is normal Uterus is enlarged to 16-18 weeks size due to multiple fibroids the largest of which is 6.1 cm Adnexa is negative with normal sized ovaries by sonogram  Musculoskeletal: Normal range of motion. She exhibits no edema and no tenderness.  Neurological: She is alert and oriented to person, place, and time. She has normal reflexes. She displays normal reflexes. No cranial nerve deficit. She exhibits normal muscle tone. Coordination normal.  Skin: Skin is warm and dry. No rash noted. No erythema. No pallor.  Psychiatric: She has a normal mood and affect. Her behavior is normal. Judgment and thought content normal.    Labs: Results for orders placed in visit on 11/15/13 (from the past 336 hour(s))  POCT URINALYSIS DIPSTICK   Collection Time    11/15/13  9:21 AM      Result Value Ref Range   Color, UA       Clarity, UA       Glucose, UA neg     Bilirubin, UA       Ketones, UA neg     Spec Grav, UA       Blood, UA neg     pH, UA       Protein, UA neg     Urobilinogen, UA       Nitrite, UA neg     Leukocytes, UA moderate (2+)    Results for orders placed in visit on 11/08/13 (from the past 336 hour(s))  POCT URINALYSIS DIPSTICK   Collection Time    11/08/13  1:59 PM      Result Value Ref Range   Color, UA       Clarity, UA       Glucose, UA neg      Bilirubin, UA       Ketones, UA neg     Spec Grav, UA       Blood, UA neg     pH, UA       Protein, UA neg     Urobilinogen, UA       Nitrite, UA neg     Leukocytes, UA Negative    Results for orders placed in visit on 11/01/13 (from the past 336 hour(s))  POCT URINALYSIS DIPSTICK   Collection Time    11/01/13  2:46 PM      Result Value Ref Range   Color, UA       Clarity, UA       Glucose, UA neg  Bilirubin, UA       Ketones, UA neg     Spec Grav, UA       Blood, UA neg     pH, UA       Protein, UA neg     Urobilinogen, UA       Nitrite, UA neg     Leukocytes, UA Negative    STREP B DNA PROBE   Collection Time    11/01/13  4:03 PM      Result Value Ref Range   GBSP NOT DETECTED    GC/CHLAMYDIA PROBE AMP   Collection Time    11/01/13  4:03 PM      Result Value Ref Range   CT Probe RNA NEGATIVE     GC Probe RNA NEGATIVE      EKG: Orders placed during the hospital encounter of 09/22/11  . ED EKG  . ED EKG  . EKG 12-LEAD  . EKG 12-LEAD  . EKG    Imaging Studies: No results found.    Assessment: Patient Active Problem List   Diagnosis Date Noted  . Breech birth 11/15/2013  . Breech presentation 11/01/2013  . Supervision of normal first pregnancy 08/09/2013  . Late prenatal care complicating pregnancy 08/09/2013    Plan:   Lazaro ArmsURE,LUTHER H 11/15/2013 9:31 AM           This encounter was created in error - please disregard. This encounter was created in error - please disregard.

## 2013-11-17 ENCOUNTER — Encounter (HOSPITAL_COMMUNITY)
Admission: RE | Admit: 2013-11-17 | Discharge: 2013-11-17 | Disposition: A | Discharge status: Home / Self Care | Payer: Medicaid Other | Source: Ambulatory Visit | Attending: Obstetrics & Gynecology | Admitting: Obstetrics & Gynecology

## 2013-11-17 ENCOUNTER — Encounter (HOSPITAL_COMMUNITY): Payer: Self-pay

## 2013-11-17 LAB — TYPE AND SCREEN
ABO/RH(D): A POS
Antibody Screen: NEGATIVE

## 2013-11-17 LAB — CBC
HCT: 33.3 % — ABNORMAL LOW (ref 36.0–46.0)
Hemoglobin: 11 g/dL — ABNORMAL LOW (ref 12.0–15.0)
MCH: 26.6 pg (ref 26.0–34.0)
MCHC: 33 g/dL (ref 30.0–36.0)
MCV: 80.6 fL (ref 78.0–100.0)
PLATELETS: 337 10*3/uL (ref 150–400)
RBC: 4.13 MIL/uL (ref 3.87–5.11)
RDW: 14.6 % (ref 11.5–15.5)
WBC: 12.2 10*3/uL — AB (ref 4.0–10.5)

## 2013-11-17 LAB — ABO/RH: ABO/RH(D): A POS

## 2013-11-17 NOTE — Patient Instructions (Signed)
18 Jillian Mcpherson  11/17/2013   Your procedure is scheduled on:  11/18/13  [facility]  Pick up the phone at the desk and dial [phone].   Call this number if you have problems the morning of surgery: [phone]   Remember:   Do not eat food after midnight   Do not drink clear liquids: 4 Hours before arrival.  Take these medicines the morning of surgery with A SIP OF WATER: NA   Do not wear jewelry, make-up or nail polish.  Do not wear lotions, powders, or perfumes. You may wear deodorant.  Do not shave 48 hours prior to surgery.  Do not bring valuables to the hospital.  [facility] is not   responsible for any belongings or valuables brought to the hospital.  Contacts, dentures or bridgework may not be worn into surgery.  Leave suitcase in the car. After surgery it may be brought to your room.  For patients admitted to the hospital, checkout time is 11:00 AM the day of              discharge.   Patients discharged the day of surgery will not be allowed to drive             home.  Name and phone number of your driver: NA  Special Instructions:      Please read over the following fact sheets that you were given:   Surgical Site Infection Prevention

## 2013-11-18 ENCOUNTER — Encounter (HOSPITAL_COMMUNITY): Payer: Self-pay | Admitting: Anesthesiology

## 2013-11-18 ENCOUNTER — Inpatient Hospital Stay (HOSPITAL_COMMUNITY): Payer: Medicaid Other | Admitting: Anesthesiology

## 2013-11-18 ENCOUNTER — Inpatient Hospital Stay (HOSPITAL_COMMUNITY)
Admission: RE | Admit: 2013-11-18 | Discharge: 2013-11-21 | DRG: 765 | Disposition: A | Discharge status: Home / Self Care | Payer: Medicaid Other | Source: Ambulatory Visit | Attending: Obstetrics & Gynecology | Admitting: Obstetrics & Gynecology

## 2013-11-18 ENCOUNTER — Encounter (HOSPITAL_COMMUNITY): Payer: Medicaid Other | Admitting: Anesthesiology

## 2013-11-18 ENCOUNTER — Encounter (HOSPITAL_COMMUNITY)
Admission: RE | Disposition: A | Discharge status: Home / Self Care | Payer: Self-pay | Source: Ambulatory Visit | Attending: Obstetrics & Gynecology

## 2013-11-18 DIAGNOSIS — O99344 Other mental disorders complicating childbirth: Secondary | ICD-10-CM | POA: Diagnosis present

## 2013-11-18 DIAGNOSIS — Z98891 History of uterine scar from previous surgery: Secondary | ICD-10-CM

## 2013-11-18 DIAGNOSIS — O321XX0 Maternal care for breech presentation, not applicable or unspecified: Principal | ICD-10-CM | POA: Diagnosis present

## 2013-11-18 DIAGNOSIS — O321XX1 Maternal care for breech presentation, fetus 1: Secondary | ICD-10-CM

## 2013-11-18 DIAGNOSIS — Z6841 Body Mass Index (BMI) 40.0 and over, adult: Secondary | ICD-10-CM

## 2013-11-18 DIAGNOSIS — Z8249 Family history of ischemic heart disease and other diseases of the circulatory system: Secondary | ICD-10-CM

## 2013-11-18 DIAGNOSIS — F329 Major depressive disorder, single episode, unspecified: Secondary | ICD-10-CM | POA: Diagnosis present

## 2013-11-18 DIAGNOSIS — Z87891 Personal history of nicotine dependence: Secondary | ICD-10-CM

## 2013-11-18 DIAGNOSIS — F3289 Other specified depressive episodes: Secondary | ICD-10-CM | POA: Diagnosis present

## 2013-11-18 DIAGNOSIS — O99214 Obesity complicating childbirth: Secondary | ICD-10-CM

## 2013-11-18 DIAGNOSIS — E669 Obesity, unspecified: Secondary | ICD-10-CM | POA: Diagnosis present

## 2013-11-18 DIAGNOSIS — Z3403 Encounter for supervision of normal first pregnancy, third trimester: Secondary | ICD-10-CM

## 2013-11-18 LAB — COMPREHENSIVE METABOLIC PANEL
ALBUMIN: 2.4 g/dL — AB (ref 3.5–5.2)
ALT: 9 U/L (ref 0–35)
ANION GAP: 13 (ref 5–15)
AST: 11 U/L (ref 0–37)
Alkaline Phosphatase: 147 U/L — ABNORMAL HIGH (ref 39–117)
BUN: 7 mg/dL (ref 6–23)
CALCIUM: 9.3 mg/dL (ref 8.4–10.5)
CO2: 20 meq/L (ref 19–32)
CREATININE: 0.55 mg/dL (ref 0.50–1.10)
Chloride: 105 mEq/L (ref 96–112)
GFR calc Af Amer: >90 mL/min (ref >90)
Glucose, Bld: 130 mg/dL — ABNORMAL HIGH (ref 70–99)
Potassium: 3.9 mEq/L (ref 3.7–5.3)
Sodium: 138 mEq/L (ref 137–147)
Total Bilirubin: 0.2 mg/dL — ABNORMAL LOW (ref 0.3–1.2)
Total Protein: 6 g/dL (ref 6.0–8.3)

## 2013-11-18 LAB — URINE MICROSCOPIC-ADD ON

## 2013-11-18 LAB — URINALYSIS, ROUTINE W REFLEX MICROSCOPIC
Bilirubin Urine: NEGATIVE
Glucose, UA: NEGATIVE mg/dL
Hgb urine dipstick: NEGATIVE
KETONES UR: 15 mg/dL — AB
NITRITE: NEGATIVE
PROTEIN: NEGATIVE mg/dL
Specific Gravity, Urine: 1.025 (ref 1.005–1.030)
Urobilinogen, UA: 1 mg/dL (ref 0.0–1.0)
pH: 6 (ref 5.0–8.0)

## 2013-11-18 LAB — RPR

## 2013-11-18 SURGERY — Surgical Case
Anesthesia: Spinal

## 2013-11-18 MED ORDER — KETOROLAC TROMETHAMINE 30 MG/ML IJ SOLN
INTRAMUSCULAR | Status: AC
Start: 2013-11-18 — End: 2013-11-19
  Filled 2013-11-18: qty 1

## 2013-11-18 MED ORDER — MEPERIDINE HCL 25 MG/ML IJ SOLN: 6.2500 mg | INTRAMUSCULAR | Status: DC | PRN

## 2013-11-18 MED ORDER — SIMETHICONE 80 MG PO CHEW: 80.0000 mg | CHEWABLE_TABLET | ORAL | Status: DC | PRN

## 2013-11-18 MED ORDER — PHENYLEPHRINE HCL 10 MG/ML IJ SOLN
INTRAMUSCULAR | Status: AC
  Filled 2013-11-18: qty 1

## 2013-11-18 MED ORDER — BUPIVACAINE IN DEXTROSE 0.75-8.25 % IT SOLN
INTRATHECAL | Status: DC | PRN
  Administered 2013-11-18: 12 mg via INTRATHECAL

## 2013-11-18 MED ORDER — SODIUM CHLORIDE 0.9 % IV SOLN: 60.0000 mL | Freq: Once | INTRAVENOUS | Status: DC

## 2013-11-18 MED ORDER — MAGNESIUM HYDROXIDE 400 MG/5ML PO SUSP: 30.0000 mL | ORAL | Status: DC | PRN

## 2013-11-18 MED ORDER — LACTATED RINGERS IV SOLN
40.0000 [IU] | INTRAVENOUS | Status: DC | PRN
  Administered 2013-11-18: 40 [IU] via INTRAVENOUS

## 2013-11-18 MED ORDER — FENTANYL CITRATE 0.05 MG/ML IJ SOLN
INTRAMUSCULAR | Status: AC
  Filled 2013-11-18: qty 2

## 2013-11-18 MED ORDER — LACTATED RINGERS IV SOLN
INTRAVENOUS | Status: DC
  Administered 2013-11-18 (×3): via INTRAVENOUS

## 2013-11-18 MED ORDER — BUPIVACAINE LIPOSOME 1.3 % IJ SUSP
20.0000 mL | Freq: Once | INTRAMUSCULAR | Status: DC
  Filled 2013-11-18 (×2): qty 20

## 2013-11-18 MED ORDER — OXYTOCIN 40 UNITS IN LACTATED RINGERS INFUSION - SIMPLE MED: 62.5000 mL/h | INTRAVENOUS | Status: AC

## 2013-11-18 MED ORDER — FENTANYL CITRATE 0.05 MG/ML IJ SOLN: 25.0000 ug | INTRAMUSCULAR | Status: DC | PRN

## 2013-11-18 MED ORDER — SCOPOLAMINE 1 MG/3DAYS TD PT72
MEDICATED_PATCH | TRANSDERMAL | Status: AC
  Administered 2013-11-18: 1.5 mg via TRANSDERMAL
  Filled 2013-11-18: qty 1

## 2013-11-18 MED ORDER — METOCLOPRAMIDE HCL 5 MG/ML IJ SOLN: 10.0000 mg | Freq: Three times a day (TID) | INTRAMUSCULAR | Status: DC | PRN

## 2013-11-18 MED ORDER — IBUPROFEN 600 MG PO TABS: 600.0000 mg | ORAL_TABLET | Freq: Four times a day (QID) | ORAL | Status: DC | PRN

## 2013-11-18 MED ORDER — SENNOSIDES-DOCUSATE SODIUM 8.6-50 MG PO TABS
2.0000 | ORAL_TABLET | ORAL | Status: DC
  Administered 2013-11-18 – 2013-11-20 (×3): 2 via ORAL
  Filled 2013-11-18 (×3): qty 2

## 2013-11-18 MED ORDER — TETANUS-DIPHTH-ACELL PERTUSSIS 5-2.5-18.5 LF-MCG/0.5 IM SUSP: 0.5000 mL | Freq: Once | INTRAMUSCULAR | Status: DC

## 2013-11-18 MED ORDER — IBUPROFEN 600 MG PO TABS
600.0000 mg | ORAL_TABLET | Freq: Four times a day (QID) | ORAL | Status: DC
  Administered 2013-11-19 – 2013-11-21 (×9): 600 mg via ORAL
  Filled 2013-11-18 (×10): qty 1

## 2013-11-18 MED ORDER — PRENATAL MULTIVITAMIN CH
1.0000 | ORAL_TABLET | Freq: Every day | ORAL | Status: DC
  Administered 2013-11-19 – 2013-11-20 (×2): 1 via ORAL
  Filled 2013-11-18 (×2): qty 1

## 2013-11-18 MED ORDER — DIPHENHYDRAMINE HCL 50 MG/ML IJ SOLN: 12.5000 mg | INTRAMUSCULAR | Status: DC | PRN

## 2013-11-18 MED ORDER — OXYCODONE-ACETAMINOPHEN 5-325 MG PO TABS
1.0000 | ORAL_TABLET | ORAL | Status: DC | PRN
  Administered 2013-11-19 – 2013-11-20 (×4): 1 via ORAL
  Administered 2013-11-20: 2 via ORAL
  Filled 2013-11-18: qty 1
  Filled 2013-11-18: qty 2
  Filled 2013-11-18 (×3): qty 1

## 2013-11-18 MED ORDER — CEFAZOLIN SODIUM-DEXTROSE 2-3 GM-% IV SOLR
2.0000 g | INTRAVENOUS | Status: AC
  Administered 2013-11-18: 2 g via INTRAVENOUS

## 2013-11-18 MED ORDER — MIDAZOLAM HCL 2 MG/2ML IJ SOLN: 0.5000 mg | Freq: Once | INTRAMUSCULAR | Status: DC | PRN

## 2013-11-18 MED ORDER — SIMETHICONE 80 MG PO CHEW
80.0000 mg | CHEWABLE_TABLET | ORAL | Status: DC
  Administered 2013-11-18 – 2013-11-20 (×3): 80 mg via ORAL
  Filled 2013-11-18 (×3): qty 1

## 2013-11-18 MED ORDER — NALBUPHINE HCL 10 MG/ML IJ SOLN: 5.0000 mg | INTRAMUSCULAR | Status: DC | PRN

## 2013-11-18 MED ORDER — NALOXONE HCL 1 MG/ML IJ SOLN: 1.0000 ug/kg/h | INTRAVENOUS | Status: DC | PRN

## 2013-11-18 MED ORDER — BUPIVACAINE LIPOSOME 1.3 % IJ SUSP
INTRAMUSCULAR | Status: DC | PRN
  Administered 2013-11-18: 20 mL

## 2013-11-18 MED ORDER — OXYTOCIN 10 UNIT/ML IJ SOLN
INTRAMUSCULAR | Status: AC
  Filled 2013-11-18: qty 4

## 2013-11-18 MED ORDER — LACTATED RINGERS IV SOLN
INTRAVENOUS | Status: DC
  Administered 2013-11-19: 03:00:00 via INTRAVENOUS

## 2013-11-18 MED ORDER — KETOROLAC TROMETHAMINE 30 MG/ML IJ SOLN: 30.0000 mg | Freq: Four times a day (QID) | INTRAMUSCULAR | Status: AC | PRN

## 2013-11-18 MED ORDER — ZOLPIDEM TARTRATE 5 MG PO TABS: 5.0000 mg | ORAL_TABLET | Freq: Every evening | ORAL | Status: DC | PRN

## 2013-11-18 MED ORDER — WITCH HAZEL-GLYCERIN EX PADS: 1.0000 "application " | MEDICATED_PAD | CUTANEOUS | Status: DC | PRN

## 2013-11-18 MED ORDER — ACETAMINOPHEN 500 MG PO TABS
1000.0000 mg | ORAL_TABLET | Freq: Four times a day (QID) | ORAL | Status: AC
  Administered 2013-11-18: 1000 mg via ORAL
  Filled 2013-11-18 (×2): qty 2

## 2013-11-18 MED ORDER — SODIUM CHLORIDE 0.9 % IJ SOLN
INTRAMUSCULAR | Status: AC
  Filled 2013-11-18: qty 50

## 2013-11-18 MED ORDER — MEASLES, MUMPS & RUBELLA VAC ~~LOC~~ INJ: 0.5000 mL | INJECTION | Freq: Once | SUBCUTANEOUS | Status: DC

## 2013-11-18 MED ORDER — MORPHINE SULFATE (PF) 0.5 MG/ML IJ SOLN
INTRAMUSCULAR | Status: DC | PRN
Start: 2013-11-18 — End: 2013-11-18
  Administered 2013-11-18: 200 ug via INTRATHECAL

## 2013-11-18 MED ORDER — MENTHOL 3 MG MT LOZG: 1.0000 | LOZENGE | OROMUCOSAL | Status: DC | PRN

## 2013-11-18 MED ORDER — SODIUM CHLORIDE 0.9 % IJ SOLN: 3.0000 mL | INTRAMUSCULAR | Status: DC | PRN

## 2013-11-18 MED ORDER — SIMETHICONE 80 MG PO CHEW
80.0000 mg | CHEWABLE_TABLET | Freq: Three times a day (TID) | ORAL | Status: DC
  Administered 2013-11-18 – 2013-11-21 (×6): 80 mg via ORAL
  Filled 2013-11-18 (×6): qty 1

## 2013-11-18 MED ORDER — KETOROLAC TROMETHAMINE 30 MG/ML IJ SOLN
30.0000 mg | Freq: Four times a day (QID) | INTRAMUSCULAR | Status: AC | PRN
  Administered 2013-11-18: 30 mg via INTRAVENOUS

## 2013-11-18 MED ORDER — ONDANSETRON HCL 4 MG PO TABS: 4.0000 mg | ORAL_TABLET | ORAL | Status: DC | PRN

## 2013-11-18 MED ORDER — PROMETHAZINE HCL 25 MG/ML IJ SOLN: 6.2500 mg | INTRAMUSCULAR | Status: DC | PRN

## 2013-11-18 MED ORDER — ONDANSETRON HCL 4 MG/2ML IJ SOLN: 4.0000 mg | Freq: Three times a day (TID) | INTRAMUSCULAR | Status: DC | PRN

## 2013-11-18 MED ORDER — DIPHENHYDRAMINE HCL 25 MG PO CAPS: 25.0000 mg | ORAL_CAPSULE | Freq: Four times a day (QID) | ORAL | Status: DC | PRN

## 2013-11-18 MED ORDER — CEFAZOLIN SODIUM-DEXTROSE 2-3 GM-% IV SOLR
INTRAVENOUS | Status: AC
  Filled 2013-11-18: qty 50

## 2013-11-18 MED ORDER — MORPHINE SULFATE 0.5 MG/ML IJ SOLN
INTRAMUSCULAR | Status: AC
  Filled 2013-11-18: qty 10

## 2013-11-18 MED ORDER — DIPHENHYDRAMINE HCL 25 MG PO CAPS: 25.0000 mg | ORAL_CAPSULE | ORAL | Status: DC | PRN

## 2013-11-18 MED ORDER — ONDANSETRON HCL 4 MG/2ML IJ SOLN: 4.0000 mg | INTRAMUSCULAR | Status: DC | PRN

## 2013-11-18 MED ORDER — NALOXONE HCL 0.4 MG/ML IJ SOLN: 0.4000 mg | INTRAMUSCULAR | Status: DC | PRN

## 2013-11-18 MED ORDER — LANOLIN HYDROUS EX OINT: 1.0000 "application " | TOPICAL_OINTMENT | CUTANEOUS | Status: DC | PRN

## 2013-11-18 MED ORDER — SCOPOLAMINE 1 MG/3DAYS TD PT72
1.0000 | MEDICATED_PATCH | Freq: Once | TRANSDERMAL | Status: DC
  Administered 2013-11-18: 1.5 mg via TRANSDERMAL

## 2013-11-18 MED ORDER — DIBUCAINE 1 % RE OINT: 1.0000 "application " | TOPICAL_OINTMENT | RECTAL | Status: DC | PRN

## 2013-11-18 MED ORDER — FENTANYL CITRATE 0.05 MG/ML IJ SOLN
INTRAMUSCULAR | Status: DC | PRN
  Administered 2013-11-18: 12.5 ug via INTRATHECAL

## 2013-11-18 MED ORDER — ONDANSETRON HCL 4 MG/2ML IJ SOLN
INTRAMUSCULAR | Status: DC | PRN
  Administered 2013-11-18: 4 mg via INTRAVENOUS

## 2013-11-18 MED ORDER — SCOPOLAMINE 1 MG/3DAYS TD PT72: 1.0000 | MEDICATED_PATCH | Freq: Once | TRANSDERMAL | Status: DC

## 2013-11-18 MED ORDER — ONDANSETRON HCL 4 MG/2ML IJ SOLN
INTRAMUSCULAR | Status: AC
  Filled 2013-11-18: qty 2

## 2013-11-18 MED ORDER — DIPHENHYDRAMINE HCL 50 MG/ML IJ SOLN: 25.0000 mg | INTRAMUSCULAR | Status: DC | PRN

## 2013-11-18 SURGICAL SUPPLY — 39 items
ADH SKN CLS APL DERMABOND .7 (GAUZE/BANDAGES/DRESSINGS) ×2
CLAMP CORD UMBIL (MISCELLANEOUS) IMPLANT
CLOTH BEACON ORANGE TIMEOUT ST (SAFETY) ×3 IMPLANT
DERMABOND ADVANCED (GAUZE/BANDAGES/DRESSINGS) ×4
DERMABOND ADVANCED .7 DNX12 (GAUZE/BANDAGES/DRESSINGS) ×2 IMPLANT
DRAPE LG THREE QUARTER DISP (DRAPES) IMPLANT
DRSG OPSITE POSTOP 4X10 (GAUZE/BANDAGES/DRESSINGS) ×3 IMPLANT
DURAPREP 26ML APPLICATOR (WOUND CARE) ×6 IMPLANT
ELECT REM PT RETURN 9FT ADLT (ELECTROSURGICAL) ×3
ELECTRODE REM PT RTRN 9FT ADLT (ELECTROSURGICAL) ×1 IMPLANT
EXTRACTOR VACUUM BELL STYLE (SUCTIONS) IMPLANT
GLOVE BIOGEL PI IND STRL 8 (GLOVE) ×1 IMPLANT
GLOVE BIOGEL PI INDICATOR 8 (GLOVE) ×2
GLOVE ECLIPSE 8.0 STRL XLNG CF (GLOVE) ×3 IMPLANT
GOWN STRL REUS W/TWL LRG LVL3 (GOWN DISPOSABLE) ×6 IMPLANT
KIT ABG SYR 3ML LUER SLIP (SYRINGE) ×3 IMPLANT
NDL HYPO 18GX1.5 BLUNT FILL (NEEDLE) ×1 IMPLANT
NDL HYPO 25X5/8 SAFETYGLIDE (NEEDLE) ×1 IMPLANT
NEEDLE HYPO 18GX1.5 BLUNT FILL (NEEDLE) ×3 IMPLANT
NEEDLE HYPO 22GX1.5 SAFETY (NEEDLE) ×3 IMPLANT
NEEDLE HYPO 25X5/8 SAFETYGLIDE (NEEDLE) ×3 IMPLANT
NS IRRIG 1000ML POUR BTL (IV SOLUTION) ×3 IMPLANT
PACK C SECTION WH (CUSTOM PROCEDURE TRAY) ×3 IMPLANT
PAD OB MATERNITY 4.3X12.25 (PERSONAL CARE ITEMS) ×3 IMPLANT
RTRCTR C-SECT PINK 25CM LRG (MISCELLANEOUS) IMPLANT
STAPLER VISISTAT 35W (STAPLE) IMPLANT
SUT CHROMIC 0 CT 1 (SUTURE) ×3 IMPLANT
SUT MNCRL 0 VIOLET CTX 36 (SUTURE) ×2 IMPLANT
SUT MONOCRYL 0 CTX 36 (SUTURE) ×4
SUT PLAIN 2 0 (SUTURE)
SUT PLAIN 2 0 XLH (SUTURE) IMPLANT
SUT PLAIN ABS 2-0 CT1 27XMFL (SUTURE) IMPLANT
SUT VIC AB 0 CTX 36 (SUTURE) ×3
SUT VIC AB 0 CTX36XBRD ANBCTRL (SUTURE) ×1 IMPLANT
SUT VIC AB 4-0 KS 27 (SUTURE) IMPLANT
SYR 20CC LL (SYRINGE) ×6 IMPLANT
TOWEL OR 17X24 6PK STRL BLUE (TOWEL DISPOSABLE) ×3 IMPLANT
TRAY FOLEY CATH 14FR (SET/KITS/TRAYS/PACK) ×2 IMPLANT
WATER STERILE IRR 1000ML POUR (IV SOLUTION) ×3 IMPLANT

## 2013-11-18 NOTE — Progress Notes (Signed)
UR completed 

## 2013-11-18 NOTE — Transfer of Care (Signed)
Immediate Anesthesia Transfer of Care Note  Patient: Jillian Mcpherson  Procedure(s) Performed: Procedure(s): CESAREAN SECTION (N/A)  Patient Location: PACU  Anesthesia Type:Spinal  Level of Consciousness: awake, alert  and oriented  Airway & Oxygen Therapy: Patient Spontanous Breathing  Post-op Assessment: Report given to PACU RN and Post -op Vital signs reviewed and stable  Post vital signs: Reviewed and stable  Complications: No apparent anesthesia complications

## 2013-11-18 NOTE — Anesthesia Postprocedure Evaluation (Signed)
Anesthesia Post Note  Patient: Jillian Mcpherson  Procedure(s) Performed: Procedure(s) (LRB): CESAREAN SECTION (N/A)  Anesthesia type: Spinal  Patient location: PACU  Post pain: Pain level controlled  Post assessment: Post-op Vital signs reviewed  Last Vitals:  Filed Vitals:   11/18/13 1415  BP: 129/66  Pulse: 80  Temp:   Resp: 20    Post vital signs: Reviewed  Level of consciousness: awake  Complications: No apparent anesthesia complications

## 2013-11-18 NOTE — Anesthesia Procedure Notes (Signed)
Spinal  Patient location during procedure: OR Start time: 11/18/2013 12:51 PM End time: 11/18/2013 12:53 PM Staffing Anesthesiologist: Leilani AbleHATCHETT, Delante Karapetyan Performed by: anesthesiologist  Preanesthetic Checklist Completed: patient identified, surgical consent, pre-op evaluation, timeout performed, IV checked, risks and benefits discussed and monitors and equipment checked Spinal Block Patient position: sitting Prep: DuraPrep Patient monitoring: heart rate, cardiac monitor, continuous pulse ox and blood pressure Approach: midline Location: L3-4 Injection technique: single-shot Needle Needle type: Pencan  Needle gauge: 24 G Needle length: 9 cm Needle insertion depth: 6 cm Assessment Sensory level: T4

## 2013-11-18 NOTE — Op Note (Signed)
Jillian Mcpherson PROCEDURE DATE: 11/18/2013  PREOPERATIVE DIAGNOSES: Intrauterine pregnancy at  5834w0d weeks gestation; malpresentation: frank breech, declined external cephalic version.   POSTOPERATIVE DIAGNOSES: The same  PROCEDURE: Primary Low Transverse Cesarean Section  SURGEON:  Dr. Duane LopeLuther Eure  ASSISTANT:  Dr. Rulon AbideKeli Irina Okelly  ANESTHESIOLOGIST: Dr. Arby BarretteHatchett  INDICATIONS: Jillian Mcpherson is a 23 y.o. G1P0 at 3234w0d here for cesarean section secondary to the indications listed under preoperative diagnoses; please see preoperative note for further details.  The risks of cesarean section were discussed with the patient including but were not limited to: bleeding which may require transfusion or reoperation; infection which may require antibiotics; injury to bowel, bladder, ureters or other surrounding organs; injury to the fetus; need for additional procedures including hysterectomy in the event of a life-threatening hemorrhage; placental abnormalities wth subsequent pregnancies, incisional problems, thromboembolic phenomenon and other postoperative/anesthesia complications.   The patient concurred with the proposed plan, giving informed written consent for the procedure.    FINDINGS:  Viable female infant in breech presentation.  Apgars 9 and 9.  Clear amniotic fluid.  Intact placenta, three vessel cord.  Normal uterus, fallopian tubes and ovaries bilaterally.  ANESTHESIA: Spinal INTRAVENOUS FLUIDS: 1500 ml ESTIMATED BLOOD LOSS: 650 ml URINE OUTPUT:  375 ml SPECIMENS: Placenta sent to [facility] COMPLICATIONS: None immediate  PROCEDURE IN DETAIL:  The patient preoperatively received intravenous antibiotics and had sequential compression devices applied to her lower extremities.  She was then taken to the operating room where spinal anesthesia was administered and was found to be adequate. She was then placed in a dorsal supine position with a leftward tilt, and prepped and draped in a sterile manner.  A foley  catheter was placed into her bladder and attached to constant gravity.  After an adequate timeout was performed, a Pfannenstiel skin incision was made with scalpel and carried through to the underlying layer of fascia. The fascia was incised in the midline, and this incision was extended bilaterally using the Mayo scissors.  Kocher clamps were applied to the superior aspect of the fascial incision and the underlying rectus muscles were dissected off bluntly. A similar process was carried out on the inferior aspect of the fascial incision. The rectus muscles were separated in the midline bluntly and the peritoneum was entered bluntly. Attention was turned to the lower uterine segment where a low transverse hysterotomy was made with a scalpel and extended bilaterally bluntly.  The infant was successfully delivered from breech position in the usual fashion with care to flex the neck during delivery of the head. The cord was clamped and cut and the infant was handed over to awaiting neonatology team. Uterine massage was then administered, and the placenta delivered intact with a three-vessel cord. The uterus was then cleared of clot and debris.  The hysterotomy was closed with 0 monocryl in a running locked fashion, and an imbricating layer was also placed with 0 monocryl. The pelvis was cleared of all clot and debris. Hemostasis was confirmed on all surfaces.  Irrigation was performed. The peritoneum and the muscles were reapproximated using 0 chromic interrupted stitches. The fascia was then closed using 0 Vicryl in a running fashion.  The subcutaneous layer was irrigated, then reapproximated with 2-0 plain gut interrupted stitches, and 20 cc of exparel diluted in 40cc of NS was injected subcutaneously around the incision.  The skin was closed with a 4-0 Vicryl subcuticular stitch and dermabond was then applied for an additional wound barrier. The patient tolerated  the procedure well. Sponge, lap, instrument and  needle counts were correct x 2.  She was taken to the recovery room in stable condition.    Jillian Mcpherson, Jillian BasemanKELI L, MD

## 2013-11-18 NOTE — H&P (Signed)
LABOR ADMISSION HISTORY AND PHYSICAL  Juelz R Hagemeister is a 23 y.o. female G1P0 with IUP at 7781w0d presenting for primary cesarean section for breech.    Pt has been breech throughout her pregnancy and most recently on 6/15. Declined ECV. No ctx, lof, vb.  +FM.  No HA, vision changes, RUQ pain  PNCare at FT since 24 wks. Has been uncomplicated. GBS neg. Normal US.    Prenatal History/Complications:  Past Medical History: Past Medical History  Diagnosis Date  . Depression     Past Surgical History: Past Surgical History  Procedure Laterality Date  . Tonsillectomy      Obstetrical History: OB History   Grav Para Term Preterm Abortions TAB SAB Ect Mult Living   1                Social History: History   Social History  . Marital Status: Single    Spouse Name: N/A    Number of Children: N/A  . Years of Education: N/A   Social History Main Topics  . Smoking status: Former Smoker -- 1.00 packs/day    Types: Cigarettes  . Smokeless tobacco: Never Used  . Alcohol Use: No  . Drug Use: No  . Sexual Activity: Not Currently   Other Topics Concern  . None   Social History Narrative  . None    Family History: Family History  Problem Relation Age of Onset  . Hypertension Mother   . Heart disease Mother   . Hypertension Father   . Asthma Father   . Heart disease Maternal Grandmother   . COPD Paternal Grandmother   . Cancer Other     great grandmother    Allergies: No Known Allergies  Prescriptions prior to admission  Medication Sig Dispense Refill  . acetaminophen (TYLENOL) 500 MG tablet Take 1,000 mg by mouth every 4 (four) hours as needed for moderate pain. Pain      . amoxicillin (AMOXIL) 500 MG capsule Take 1 capsule (500 mg total) by mouth 3 (three) times daily.  21 capsule  0  . Prenatal Vit-Fe Fum-FA-Omega (OB COMPLETE WITH DHA) 28-1.25-200 MG CAPS Take 1 tablet by mouth daily.  30 each  12     Review of Systems   All systems reviewed and negative  except as stated in HPI  Blood pressure 136/93, pulse 111, temperature 97.7 F (36.5 C), temperature source Oral, resp. rate 18, last menstrual period 12/13/2012, SpO2 100.00%. General appearance: alert, cooperative and no distress Lungs: clear to auscultation bilaterally Heart: regular rate and rhythm Abdomen: soft, non-tender; bowel sounds normal Extremities: Homans sign is negative, no sign of DVT DTR's 2+, symmetric  Presentation: breech Fetal monitoring 131 Uterine activityNone     Prenatal labs: ABO, Rh: --/--/A POS, A POS (07/01 1500) Antibody: NEG (07/01 1500) Rubella:   RPR: NON REAC (07/01 1500)  HBsAg: NEGATIVE (03/23 1052)  HIV: NONREACTIVE (04/20 1242)  GBS: NOT DETECTED (06/15 1603)  2 hr Glucola normal Genetic screening  Too late Anatomy US normal   Prenatal Transfer Tool  Maternal Diabetes: No Genetic Screening: Declined- too late Maternal Ultrasounds/Referrals: Normal Fetal Ultrasounds or other Referrals:  None Maternal Substance Abuse:  No Significant Maternal Medications:  None Significant Maternal Lab Results: None     Results for orders placed during the hospital encounter of 11/18/13 (from the past 24 hour(s))  ABO/RH   Collection Time    11/17/13  3:00 PM      Result Value Ref  Range   ABO/RH(D) A POS    Results for orders placed during the hospital encounter of 11/17/13 (from the past 24 hour(s))  CBC   Collection Time    11/17/13  3:00 PM      Result Value Ref Range   WBC 12.2 (*) 4.0 - 10.5 K/uL   RBC 4.13  3.87 - 5.11 MIL/uL   Hemoglobin 11.0 (*) 12.0 - 15.0 g/dL   HCT 16.133.3 (*) 09.636.0 - 04.546.0 %   MCV 80.6  78.0 - 100.0 fL   MCH 26.6  26.0 - 34.0 pg   MCHC 33.0  30.0 - 36.0 g/dL   RDW 40.914.6  81.111.5 - 91.415.5 %   Platelets 337  150 - 400 K/uL  RPR   Collection Time    11/17/13  3:00 PM      Result Value Ref Range   RPR NON REAC  NON REAC  TYPE AND SCREEN   Collection Time    11/17/13  3:00 PM      Result Value Ref Range   ABO/RH(D)  A POS     Antibody Screen NEG     Sample Expiration 11/20/2013      Assessment: Lashaya R Sprague is a 23 y.o. G1P0 at 6187w0d here for elective primary cesarean section for breech (head maternal left on US today).   The risks of cesarean section discussed with the patient included but were not limited to: bleeding which may require transfusion or reoperation; infection which may require antibiotics; injury to bowel, bladder, ureters or other surrounding organs; injury to the fetus; need for additional procedures including hysterectomy in the event of a life-threatening hemorrhage; placental abnormalities wth subsequent pregnancies, incisional problems, thromboembolic phenomenon and other postoperative/anesthesia complications. The patient concurred with the proposed plan, giving informed written consent for the procedure.   Patient has been NPO since midnight she will remain NPO for procedure. Anesthesia and OR aware. Preoperative prophylactic antibiotics and SCDs ordered on call to the OR.  To OR when ready.   Rosaly Labarbera L 11/18/2013, 11:40 AM

## 2013-11-18 NOTE — Anesthesia Preprocedure Evaluation (Addendum)
Anesthesia Evaluation  Patient identified by MRN, date of birth, ID band Patient awake    Reviewed: Allergy & Precautions, H&P , NPO status , Patient's Chart, lab work & pertinent test results  Airway Mallampati: III      Dental   Pulmonary former smoker,  breath sounds clear to auscultation        Cardiovascular Exercise Tolerance: Good Rhythm:regular Rate:Normal     Neuro/Psych    GI/Hepatic   Endo/Other  Morbid obesity  Renal/GU      Musculoskeletal   Abdominal   Peds  Hematology   Anesthesia Other Findings   Reproductive/Obstetrics (+) Pregnancy                          Anesthesia Physical Anesthesia Plan  ASA: III  Anesthesia Plan: Spinal   Post-op Pain Management:    Induction:   Airway Management Planned:   Additional Equipment:   Intra-op Plan:   Post-operative Plan:   Informed Consent: I have reviewed the patients History and Physical, chart, labs and discussed the procedure including the risks, benefits and alternatives for the proposed anesthesia with the patient or authorized representative who has indicated his/her understanding and acceptance.     Plan Discussed with: Anesthesiologist, CRNA and Surgeon  Anesthesia Plan Comments:         Anesthesia Quick Evaluation

## 2013-11-18 NOTE — Lactation Note (Signed)
This note was copied from the chart of Girl Caralee Yano. Lactation Consultation Note  Patient Name: Girl Loree Cantera Today's Date: 11/18/2013 Reason for consult: Initial assessment Baby 8 hours of life. Patient's MBU RN Thaih R requests assessment of flat nipples. Baby not able to latch. Mom return demonstrated hand expression, no colostrum noted. Enc mom to attempt to latch baby in football hold on right breast. Baby frustrated, pulling away from breast. Fitted mom with #20 NS and baby latched well after several attempts. Baby suckled rhythmically, a few swallows noted, colostrum in NS when baby finished and mom nipple everted in NS. Also left #24 NS in room with instructions of how to use if mom's nipple everts to the point that the small NS is uncomfortable. Plan is for mom to feed with cues, and at least 8-12 times per 24 hours. Mom will massage breasts, hand express and attempt to latch baby directly to breast. Enc mom to use rolled up wash cloth under breasts to elevate. If baby will not latch, mom can use NS. Mom demonstrated ability to apply NS and latch baby at breast. Mom given hand pump with instructions and will use after nursing 5-10 minutes on each breast for additional stimulation. Discussed with mom and patient's MBU RN Milly Jakobhaih R that if mom doesn't have increase in colostrum flow in the a.m., mom should be set up with DEBP. Enc mom to offer lots of STS. Mom given [employer] brochure, aware of OP/BFSG and community resources.  Maternal Data Has patient been taught Hand Expression?: Yes Does the patient have breastfeeding experience prior to this delivery?: No  Feeding Feeding Type: Breast Fed  LATCH Score/Interventions Latch: Too sleepy or reluctant, no latch achieved, no sucking elicited.  Audible Swallowing: None  Type of Nipple: Flat  Comfort (Breast/Nipple): Soft / non-tender     Hold (Positioning): Assistance needed to correctly position infant at breast and maintain latch.  LATCH  Score: 4  Lactation Tools Discussed/Used     Consult Status Consult Status: Follow-up    Geralynn OchsWILLIARD, Breckin Savannah 11/18/2013, 10:21 PM

## 2013-11-19 ENCOUNTER — Encounter (HOSPITAL_COMMUNITY): Payer: Self-pay | Admitting: Obstetrics & Gynecology

## 2013-11-19 LAB — CBC
HCT: 32 % — ABNORMAL LOW (ref 36.0–46.0)
Hemoglobin: 10.5 g/dL — ABNORMAL LOW (ref 12.0–15.0)
MCH: 26.6 pg (ref 26.0–34.0)
MCHC: 32.8 g/dL (ref 30.0–36.0)
MCV: 81 fL (ref 78.0–100.0)
Platelets: 322 10*3/uL (ref 150–400)
RBC: 3.95 MIL/uL (ref 3.87–5.11)
RDW: 14.7 % (ref 11.5–15.5)
WBC: 14.6 10*3/uL — AB (ref 4.0–10.5)

## 2013-11-19 LAB — BIRTH TISSUE RECOVERY COLLECTION (PLACENTA DONATION)

## 2013-11-19 NOTE — Anesthesia Postprocedure Evaluation (Signed)
  Anesthesia Post-op Note  Anesthesia Post Note  Patient: Jillian Mcpherson  Procedure(s) Performed: Procedure(s) (LRB): CESAREAN SECTION (N/A)  Anesthesia type: Spinal  Patient location: Mother/Baby  Post pain: Pain level controlled  Post assessment: Post-op Vital signs reviewed  Last Vitals:  Filed Vitals:   11/19/13 0510  BP: 131/73  Pulse: 82  Temp:   Resp:     Post vital signs: Reviewed  Level of consciousness: awake  Complications: No apparent anesthesia complications

## 2013-11-19 NOTE — Addendum Note (Signed)
Addendum created 11/19/13 [id number] by Turner DanielsJennifer L Modesty Rudy, CRNA   Modules edited: Charges VN, Notes Section   Notes Section:  File: [id number]

## 2013-11-19 NOTE — Lactation Note (Signed)
This note was copied from the chart of Jillian Mcpherson. Lactation Consultation Note Follow up visit at 28 hours of age.  Mom reports baby has been sleepy and has not been to breast since 10:30, >7 hours.  Explained to mom and visitors it's time to put baby to breast and they really need to wake baby for feedings at least every 3 hours to attempt.  Discussed feeding frequency of 8-12 times in 24 hours.  Mom reports just changing 2 wet diapers.  Mom is using nipple shield for feedings, but only hand pumped once because it caused hardness (swelling/edema) on her areolas.  Showed mom reverse pressure to help displace that edema and assisted with hand pump and nipple shield placement.  Baby latched to left nipple shield with assist and mouth was not appropriately open and flanged.  Reattmpted and baby was not able to get a deep wide latch although she sucked some with stimulation.  Allowed baby to suck gloved finger.  Baby assessed to have an indention on tip of tongue, frenulum is not visualized and tongue does not extend well although it can.  Baby humps back of tongue and does not cup when sucking on gloved finger.  Allowed mom to pump with DEBP to stimulated milk supply, she does not tolerate hand expression after due to pain.  Unable to collect any colostrum.  Re attempted to latch with nipple shield with nipple now more erect.   Baby does not tolerate even opening her mouth.  Discussed feeding options and parents agreed to [employer] formula in a slow flow nipple since baby is now 4529 hours old.  Baby does not tolerate feeding well with uncoordinated sucking and tongue movements.   Demonstrated and discussed feeding techniques with parents.  Mom is happy baby is eating something and mentions baby may have to have bottles.  Encouraged mom to continue breast attempts, post pumping with DEBP on Preemie setting and offering supplement per feeding guidelines sheet given to parents.  [facility], Clydie BraunKaren, and  discussed checking a jaundice level due to yellow face and sleepy with feedings.      Patient Name: Jillian Mcpherson Today's Date: 11/19/2013 Reason for consult: Follow-up assessment   Maternal Data Has patient been taught Hand Expression?: Yes Does the patient have breastfeeding experience prior to this delivery?: No  Feeding Feeding Type: Breast Fed Length of feed: 0 min  LATCH Score/Interventions Latch: Repeated attempts needed to sustain latch, nipple held in mouth throughout feeding, stimulation needed to elicit sucking reflex. Intervention(s): Teach feeding cues;Waking techniques Intervention(s): Assist with latch;Breast massage;Breast compression;Adjust position  Audible Swallowing: None Intervention(s): Skin to skin;Hand expression  Type of Nipple: Flat Intervention(s): Hand pump;Reverse pressure;Double electric pump  Comfort (Breast/Nipple): Filling, red/small blisters or bruises, mild/mod discomfort (mom does not tolerate hand expression well due to pain)     Hold (Positioning): Assistance needed to correctly position infant at breast and maintain latch. Intervention(s): Skin to skin;Position options;Support Pillows;Breastfeeding basics reviewed  LATCH Score: 4  Lactation Tools Discussed/Used Tools: Nipple Dorris CarnesShields;Pump WIC Program: Yes Pump Review: Setup, frequency, and cleaning Initiated by:: JS Date initiated:: 11/19/13   Consult Status Consult Status: Follow-up Date: 11/20/13 Follow-up type: In-patient    Jannifer RodneyShoptaw, Tyjae Shvartsman Lynn 11/19/2013, 6:58 PM

## 2013-11-19 NOTE — Progress Notes (Signed)
Subjective: Postpartum Day 1: Cesarean Delivery Patient reports incisional pain, tolerating PO, + flatus, + BM and no problems voiding.   Ambulating feeling well Objective: Vital signs in last 24 hours: Temp:  [97.7 F (36.5 C)-98.5 F (36.9 C)] 98.2 F (36.8 C) (07/03 0500) Pulse Rate:  [66-111] 82 (07/03 0510) Resp:  [14-20] 18 (07/03 0500) BP: (101-137)/(56-93) 131/73 mmHg (07/03 0510) SpO2:  [95 %-100 %] 97 % (07/03 0500) Weight:  [113.399 kg (250 lb)] 113.399 kg (250 lb) (07/02 1540)  Physical Exam:  General: alert, cooperative, appears stated age and no distress Lochia: appropriate Uterine Fundus: Firm U-1 Incision: no significant drainage, no dehiscence, no significant erythema DVT Evaluation: No evidence of DVT seen on physical exam. Negative Homan's sign. No cords or calf tenderness. No significant calf/ankle edema.   Recent Labs  11/17/13 1500 11/19/13 0525  HGB 11.0* 10.5*  HCT 33.3* 32.0*    Assessment/Plan: Status post Cesarean section. Doing well postoperatively.  Doing well pp, declines contraception, encouraged 4381yr between pregnancy. Breast feeding. Otherwise diong well. Likely 48-72h discharge.  Jolyn LentODOM, Annaleise Burger RYAN 11/19/2013, 9:25 AM

## 2013-11-20 NOTE — Progress Notes (Signed)
Subjective: Postpartum Day 1: Cesarean Delivery Patient reports incisional pain improved, tolerating PO, + flatus, + BM and no problems voiding.   Ambulating feeling well Objective: Vital signs in last 24 hours: Temp:  [97.9 F (36.6 C)-99.3 F (37.4 C)] 97.9 F (36.6 C) (07/04 0602) Pulse Rate:  [73-88] 73 (07/04 0602) Resp:  [18] 18 (07/04 0602) BP: (120-135)/(74-80) 120/78 mmHg (07/04 0602) SpO2:  [97 %] 97 % (07/03 0900)  Physical Exam:  General: alert, cooperative, appears stated age and no distress Lochia: appropriate Uterine Fundus: Firm Incision: no significant drainage, no dehiscence, no significant erythema, dressing intact DVT Evaluation: No evidence of DVT seen on physical exam. Negative Homan's sign. No cords or calf tenderness. No significant calf/ankle edema.   Recent Labs  11/17/13 1500 11/19/13 0525  HGB 11.0* 10.5*  HCT 33.3* 32.0*    Assessment/Plan: Status post Cesarean section.  Doing well postoperatively.  Discussed contraception further today, undecided at this time, likely condoms, encouraged 297yr between pregnancy. Breast feeding. Otherwise doing well.  Plan for discharge tmw  Anselm LisMarsh, Melanie 11/20/2013, 7:49 AM  I spoke with and examined patient and agree with resident's note and plan of care.  Tawana ScaleMichael Ryan Kiari Hosmer, MD OB Fellow 11/20/2013 8:44 AM

## 2013-11-20 NOTE — Progress Notes (Signed)
Clinical Social Work Department PSYCHOSOCIAL ASSESSMENT - MATERNAL/CHILD 11/20/2013  Patient:  Jillian Mcpherson,Jillian Mcpherson  Account Number:  [id number]  Admit Date:  11/18/2013  Childs Name:   Jillian Mcpherson    Clinical Social Worker:  Marilee Ditommaso, LCSW   Date/Time:  11/20/2013 01:00 PM  Date Referred:  11/18/2013   Referral source  Central Nursery     Referred reason  Depression/Anxiety   Other referral source:    I:  FAMILY / HOME ENVIRONMENT Child's legal guardian:  PARENT  Guardian - Name Guardian - Age Guardian - Address  Jillian Mcpherson 22 [address]  [place], [place] [address]  Mcpherson, Jillian  same as above   Other household support members/support persons Other support:    II  PSYCHOSOCIAL DATA Information Source:    Financial and Community Resources Employment:   FOB is employed   Financial resources:  Medicaid If Medicaid - County:   Other  WIC  Food Stamps   School / Grade:   Maternity Care Coordinator / Child Services Coordination / Early Interventions:  Cultural issues impacting care:    III  STRENGTHS Strengths  Supportive family/friends  Home prepared for Child (including basic supplies)  Adequate Resources   Strength comment:    IV  RISK FACTORS AND CURRENT PROBLEMS Current Problem:       V  SOCIAL WORK ASSESSMENT Acknowledged order for social work consult to assess mother's hx of depression.   Met with mother who was pleasant and receptive to social work intervention.   She and FOB are not married, but they cohabitate.   This is the couple's first child.  FOB is employed and mother will be a stay at home mom.    Mother acknowledges hx of depression in middle school and states that she was prescribe Zoloft for about a year.  She also reports participating in counseling during this time.    Informed that she has never been hospitalized for mental illness, and has not needed treatment since middle school.   She reports no current symptom of  depression or anxiety.   She is a little nervous about newborn because it is her first child, but states that her mother and sister will be staying with her for about 2 weeks after she returns home.    She denies any hx of illicit drug use. No acute social concerns noted or reported at this time. Mother informed of social work availability.      VI SOCIAL WORK PLAN Social Work Plan  No Further Intervention Required / No Barriers to Discharge   Type of pt/family education:  Information and resource on PP Depression If child protective services report - county:   If child protective services report - date:   Information/referral to community resources comment:   Other social work plan:     

## 2013-11-21 MED ORDER — OXYCODONE-ACETAMINOPHEN 5-325 MG PO TABS: 1.0000 | ORAL_TABLET | ORAL | Status: DC | PRN

## 2013-11-21 MED ORDER — IBUPROFEN 600 MG PO TABS: 600.0000 mg | ORAL_TABLET | Freq: Four times a day (QID) | ORAL | Status: DC | PRN

## 2013-11-21 NOTE — Progress Notes (Signed)
Advised mom not to sleep with baby

## 2013-11-21 NOTE — Discharge Summary (Signed)
Obstetric Discharge Summary Reason for Admission: cesarean section for breech, declined ECV Prenatal Procedures: ultrasound Intrapartum Procedures: cesarean: low cervical, transverse Postpartum Procedures: none Complications-Operative and Postpartum: none Eating, drinking, voiding, ambulating well.  +flatus.  Lochia and pain wnl.  Denies dizziness, lightheadedness, or sob. No complaints.   Hospital Course: Jillian Mcpherson is a 23 y.o. 181P1001 female admited at 39.0wks. She had a PLTCS for breech, declined ECV. Her pp course has been uncomplicated.  By PPD#3 she is doing well and is deemed to have received the full benefit of her hospital stay.  Filed Vitals:   11/21/13 0540  BP: 134/84  Pulse: 105  Temp: 97.9 F (36.6 C)  Resp: 20   H/H: Lab Results  Component Value Date/Time   HGB 10.5* 11/19/2013  5:25 AM   HCT 32.0* 11/19/2013  5:25 AM    Physical Exam: General: alert, cooperative and no distress Abdomen/Uterine Fundus: Appropriately tender, non-distended, FF @ U-2 Incision: no s/s infection, no drainage, honeycomb dressing intact Lochia: appropriate Extremities: No evidence of DVT seen on physical exam. Negative Homan's sign, no cords, calf tenderness, or significant calf/ankle edema   Discharge Diagnoses: Term Pregnancy-delivered  Discharge Information: Date: 11/29/2010 Activity: pelvic rest Diet: routine  Medications: PNV, Ibuprofen and Percocet Breast feeding: Yes Contraception: condoms Circumcision: n/a Condition: stable Instructions: refer to handout Discharge to: home  Infant: Home with mother  Follow-up Information   Follow up with [facility] On 11/25/2013. (as scheduled, then , 4-6 weeks for your postpartum visit- call to make this appointment)    Specialty:  Obstetrics and Gynecology   Contact information:   [address] [place] [id number] [phone]      Marge DuncansBooker, Icie Kuznicki Randall, CNM, WHNP-BC 11/21/2013,7:31 AM

## 2013-11-21 NOTE — Discharge Instructions (Signed)
Cesarean Delivery °Care After °Refer to this sheet in the next few weeks. These instructions provide you with information on caring for yourself after your procedure. Your health care provider may also give you specific instructions. Your treatment has been planned according to current medical practices, but problems sometimes occur. Call your health care provider if you have any problems or questions after you go home. °HOME CARE INSTRUCTIONS  °· Only take over-the-counter or prescription medications as directed by your health care provider. °· Do not drink alcohol, especially if you are breastfeeding or taking medication to relieve pain. °· Do not chew or smoke tobacco. °· Continue to use good perineal care. Good perineal care includes: °¨ Wiping your perineum from front to back. °¨ Keeping your perineum clean. °· Check your surgical cut (incision) daily for increased redness, drainage, swelling, or separation of skin. °· Clean your incision gently with soap and water every day, and then pat it dry. If your health care provider says it is OK, leave the incision uncovered. Use a bandage (dressing) if the incision is draining fluid or appears irritated. If the adhesive strips across the incision do not fall off within 7 days, carefully peel them off. °· Hug a pillow when coughing or sneezing until your incision is healed. This helps to relieve pain. °· Do not use tampons or douche until your health care provider says it is okay. °· Shower, wash your hair, and take tub baths as directed by your health care provider. °· Wear a well-fitting bra that provides breast support. °· Limit wearing support panties or control-top hose. °· Drink enough fluids to keep your urine clear or pale yellow. °· Eat high-fiber foods such as whole grain cereals and breads, brown rice, beans, and fresh fruits and vegetables every day. These foods may help prevent or relieve constipation. °· Resume activities such as climbing stairs,  driving, lifting, exercising, or traveling as directed by your health care provider. °· Talk to your health care provider about resuming sexual activities. This is dependent upon your risk of infection, your rate of healing, and your comfort and desire to resume sexual activity. °· Try to have someone help you with your household activities and your newborn for at least a few days after you leave the hospital. °· Rest as much as possible. Try to rest or take a nap when your newborn is sleeping. °· Increase your activities gradually. °· Keep all of your scheduled postpartum appointments. It is very important to keep your scheduled follow-up appointments. At these appointments, your health care provider will be checking to make sure that you are healing physically and emotionally. °SEEK MEDICAL CARE IF:  °· You are passing large clots from your vagina. Save any clots to show your health care provider. °· You have a foul smelling discharge from your vagina. °· You have trouble urinating. °· You are urinating frequently. °· You have pain when you urinate. °· You have a change in your bowel movements. °· You have increasing redness, pain, or swelling near your incision. °· You have pus draining from your incision. °· Your incision is separating. °· You have painful, hard, or reddened breasts. °· You have a severe headache. °· You have blurred vision or see spots. °· You feel sad or depressed. °· You have thoughts of hurting yourself or your newborn. °· You have questions about your care, the care of your newborn, or medications. °· You are dizzy or lightheaded. °· You have a rash. °· You   have pain, redness, or swelling at the site of the removed intravenous access (IV) tube.  You have nausea or vomiting.  You stopped breastfeeding and have not had a menstrual period within 12 weeks of stopping.  You are not breastfeeding and have not had a menstrual period within 12 weeks of delivery.  You have a fever. SEEK  IMMEDIATE MEDICAL CARE IF:  You have persistent pain.  You have chest pain.  You have shortness of breath.  You faint.  You have leg pain.  You have stomach pain.  Your vaginal bleeding saturates 2 or more sanitary pads in 1 hour. MAKE SURE YOU:   Understand these instructions.  Will watch your condition.  Will get help right away if you are not doing well or get worse. Document Released: 01/26/2002 Document Revised: 01/06/2013 Document Reviewed: 01/01/2012 [facility] Patient Information 2015 [employer], [place]. This information is not intended to replace advice given to you by your health care provider. Make sure you discuss any questions you have with your health care provider.  Breastfeeding Deciding to breastfeed is one of the best choices you can make for you and your baby. A change in hormones during pregnancy causes your breast tissue to grow and increases the number and size of your milk ducts. These hormones also allow proteins, sugars, and fats from your blood supply to make breast milk in your milk-producing glands. Hormones prevent breast milk from being released before your baby is born as well as prompt milk flow after birth. Once breastfeeding has begun, thoughts of your baby, as well as his or her sucking or crying, can stimulate the release of milk from your milk-producing glands.  BENEFITS OF BREASTFEEDING For Your Baby  Your first milk (colostrum) helps your baby's digestive system function better.   There are antibodies in your milk that help your baby fight off infections.   Your baby has a lower incidence of asthma, allergies, and sudden infant death syndrome.   The nutrients in breast milk are better for your baby than infant formulas and are designed uniquely for your baby's needs.   Breast milk improves your baby's brain development.   Your baby is less likely to develop other conditions, such as childhood obesity, asthma, or type 2 diabetes mellitus.   For You   Breastfeeding helps to create a very special bond between you and your baby.   Breastfeeding is convenient. Breast milk is always available at the correct temperature and costs nothing.   Breastfeeding helps to burn calories and helps you lose the weight gained during pregnancy.   Breastfeeding makes your uterus contract to its prepregnancy size faster and slows bleeding (lochia) after you give birth.   Breastfeeding helps to lower your risk of developing type 2 diabetes mellitus, osteoporosis, and breast or ovarian cancer later in life. SIGNS THAT YOUR BABY IS HUNGRY Early Signs of Hunger  Increased alertness or activity.  Stretching.  Movement of the head from side to side.  Movement of the head and opening of the mouth when the corner of the mouth or cheek is stroked (rooting).  Increased sucking sounds, smacking lips, cooing, sighing, or squeaking.  Hand-to-mouth movements.  Increased sucking of fingers or hands. Late Signs of Hunger  Fussing.  Intermittent crying. Extreme Signs of Hunger Signs of extreme hunger will require calming and consoling before your baby will be able to breastfeed successfully. Do not wait for the following signs of extreme hunger to occur before you initiate breastfeeding:  Restlessness.  A loud, strong cry.   Screaming. BREASTFEEDING BASICS Breastfeeding Initiation  Find a comfortable place to sit or lie down, with your neck and back well supported.  Place a pillow or rolled up blanket under your baby to bring him or her to the level of your breast (if you are seated). Nursing pillows are specially designed to help support your arms and your baby while you breastfeed.  Make sure that your baby's abdomen is facing your abdomen.   Gently massage your breast. With your fingertips, massage from your chest wall toward your nipple in a circular motion. This encourages milk flow. You may need to continue this action during  the feeding if your milk flows slowly.  Support your breast with 4 fingers underneath and your thumb above your nipple. Make sure your fingers are well away from your nipple and your baby's mouth.   Stroke your baby's lips gently with your finger or nipple.   When your baby's mouth is open wide enough, quickly bring your baby to your breast, placing your entire nipple and as much of the colored area around your nipple (areola) as possible into your baby's mouth.   More areola should be visible above your baby's upper lip than below the lower lip.   Your baby's tongue should be between his or her lower gum and your breast.   Ensure that your baby's mouth is correctly positioned around your nipple (latched). Your baby's lips should create a seal on your breast and be turned out (everted).  It is common for your baby to suck about 2-3 minutes in order to start the flow of breast milk. Latching Teaching your baby how to latch on to your breast properly is very important. An improper latch can cause nipple pain and decreased milk supply for you and poor weight gain in your baby. Also, if your baby is not latched onto your nipple properly, he or she may swallow some air during feeding. This can make your baby fussy. Burping your baby when you switch breasts during the feeding can help to get rid of the air. However, teaching your baby to latch on properly is still the best way to prevent fussiness from swallowing air while breastfeeding. Signs that your baby has successfully latched on to your nipple:    Silent tugging or silent sucking, without causing you pain.   Swallowing heard between every 3-4 sucks.    Muscle movement above and in front of his or her ears while sucking.  Signs that your baby has not successfully latched on to nipple:   Sucking sounds or smacking sounds from your baby while breastfeeding.  Nipple pain. If you think your baby has not latched on correctly, slip  your finger into the corner of your baby's mouth to break the suction and place it between your baby's gums. Attempt breastfeeding initiation again. Signs of Successful Breastfeeding Signs from your baby:   A gradual decrease in the number of sucks or complete cessation of sucking.   Falling asleep.   Relaxation of his or her body.   Retention of a small amount of milk in his or her mouth.   Letting go of your breast by himself or herself. Signs from you:  Breasts that have increased in firmness, weight, and size 1-3 hours after feeding.   Breasts that are softer immediately after breastfeeding.  Increased milk volume, as well as a change in milk consistency and color by the fifth day of  breastfeeding.   Nipples that are not sore, cracked, or bleeding. Signs That Your Randel Books is Getting Enough Milk  Wetting at least 3 diapers in a 24-hour period. The urine should be clear and pale yellow by age 21 days.  At least 3 stools in a 24-hour period by age 21 days. The stool should be soft and yellow.  At least 3 stools in a 24-hour period by age 69 days. The stool should be seedy and yellow.  No loss of weight greater than 10% of birth weight during the first 31 days of age.  Average weight gain of 4-7 ounces (113-198 g) per week after age 41 days.  Consistent daily weight gain by age 61 days, without weight loss after the age of 2 weeks. After a feeding, your baby may spit up a small amount. This is common. BREASTFEEDING FREQUENCY AND DURATION Frequent feeding will help you make more milk and can prevent sore nipples and breast engorgement. Breastfeed when you feel the need to reduce the fullness of your breasts or when your baby shows signs of hunger. This is called "breastfeeding on demand." Avoid introducing a pacifier to your baby while you are working to establish breastfeeding (the first 4-6 weeks after your baby is born). After this time you may choose to use a pacifier. Research  has shown that pacifier use during the first year of a baby's life decreases the risk of sudden infant death syndrome (SIDS). Allow your baby to feed on each breast as long as he or she wants. Breastfeed until your baby is finished feeding. When your baby unlatches or falls asleep while feeding from the first breast, offer the second breast. Because newborns are often sleepy in the first few weeks of life, you may need to awaken your baby to get him or her to feed. Breastfeeding times will vary from baby to baby. However, the following rules can serve as a guide to help you ensure that your baby is properly fed:  Newborns (babies 96 weeks of age or younger) may breastfeed every 1-3 hours.  Newborns should not go longer than 3 hours during the day or 5 hours during the night without breastfeeding.  You should breastfeed your baby a minimum of 8 times in a 24-hour period until you begin to introduce solid foods to your baby at around 97 months of age. BREAST MILK PUMPING Pumping and storing breast milk allows you to ensure that your baby is exclusively fed your breast milk, even at times when you are unable to breastfeed. This is especially important if you are going back to work while you are still breastfeeding or when you are not able to be present during feedings. Your lactation consultant can give you guidelines on how long it is safe to store breast milk.  A breast pump is a machine that allows you to pump milk from your breast into a sterile bottle. The pumped breast milk can then be stored in a refrigerator or freezer. Some breast pumps are operated by hand, while others use electricity. Ask your lactation consultant which type will work best for you. Breast pumps can be purchased, but some hospitals and breastfeeding support groups lease breast pumps on a monthly basis. A lactation consultant can teach you how to hand express breast milk, if you prefer not to use a pump.  CARING FOR YOUR BREASTS  WHILE YOU BREASTFEED Nipples can become dry, cracked, and sore while breastfeeding. The following recommendations can help keep your  breasts moisturized and healthy: °· Avoid using soap on your nipples.   °· Wear a supportive bra. Although not required, special nursing bras and tank tops are designed to allow access to your breasts for breastfeeding without taking off your entire bra or top. Avoid wearing underwire-style bras or extremely tight bras. °· Air dry your nipples for 3-4 minutes after each feeding.   °· Use only cotton bra pads to absorb leaked breast milk. Leaking of breast milk between feedings is normal.   °· Use lanolin on your nipples after breastfeeding. Lanolin helps to maintain your skin's normal moisture barrier. If you use pure lanolin, you do not need to wash it off before feeding your baby again. Pure lanolin is not toxic to your baby. You may also hand express a few drops of breast milk and gently massage that milk into your nipples and allow the milk to air dry. °In the first few weeks after giving birth, some women experience extremely full breasts (engorgement). Engorgement can make your breasts feel heavy, warm, and tender to the touch. Engorgement peaks within 3-5 days after you give birth. The following recommendations can help ease engorgement: °· Completely empty your breasts while breastfeeding or pumping. You may want to start by applying warm, moist heat (in the shower or with warm water-soaked hand towels) just before feeding or pumping. This increases circulation and helps the milk flow. If your baby does not completely empty your breasts while breastfeeding, pump any extra milk after he or she is finished. °· Wear a snug bra (nursing or regular) or tank top for 1-2 days to signal your body to slightly decrease milk production. °· Apply ice packs to your breasts, unless this is too uncomfortable for you. °· Make sure that your baby is latched on and positioned properly while  breastfeeding. °If engorgement persists after 48 hours of following these recommendations, contact your health care provider or a lactation consultant. °OVERALL HEALTH CARE RECOMMENDATIONS WHILE BREASTFEEDING °· Eat healthy foods. Alternate between meals and snacks, eating 3 of each per day. Because what you eat affects your breast milk, some of the foods may make your baby more irritable than usual. Avoid eating these foods if you are sure that they are negatively affecting your baby. °· Drink milk, fruit juice, and water to satisfy your thirst (about 10 glasses a day).   °· Rest often, relax, and continue to take your prenatal vitamins to prevent fatigue, stress, and anemia. °· Continue breast self-awareness checks. °· Avoid chewing and smoking tobacco. °· Avoid alcohol and drug use. °Some medicines that may be harmful to your baby can pass through breast milk. It is important to ask your health care provider before taking any medicine, including all over-the-counter and prescription medicine as well as vitamin and herbal supplements. °It is possible to become pregnant while breastfeeding. If birth control is desired, ask your health care provider about options that will be safe for your baby. °SEEK MEDICAL CARE IF:  °· You feel like you want to stop breastfeeding or have become frustrated with breastfeeding. °· You have painful breasts or nipples. °· Your nipples are cracked or bleeding. °· Your breasts are red, tender, or warm. °· You have a swollen area on either breast. °· You have a fever or chills. °· You have nausea or vomiting. °· You have drainage other than breast milk from your nipples. °· Your breasts do not become full before feedings by the fifth day after you give birth. °· You feel sad and   depressed. °· Your baby is too sleepy to eat well. °· Your baby is having trouble sleeping.   °· Your baby is wetting less than 3 diapers in a 24-hour period. °· Your baby has less than 3 stools in a 24-hour  period. °· Your baby's skin or the white part of his or her eyes becomes yellow.   °· Your baby is not gaining weight by 5 days of age. °SEEK IMMEDIATE MEDICAL CARE IF:  °· Your baby is overly tired (lethargic) and does not want to wake up and feed. °· Your baby develops an unexplained fever. °Document Released: 05/06/2005 Document Revised: 05/11/2013 Document Reviewed: 10/28/2012 °ExitCare® Patient Information ©2015 [employer]. This information is not intended to replace advice given to you by your health care provider. Make sure you discuss any questions you have with your health care provider. ° °

## 2013-11-25 ENCOUNTER — Ambulatory Visit (INDEPENDENT_AMBULATORY_CARE_PROVIDER_SITE_OTHER): Payer: Medicaid Other | Admitting: Obstetrics & Gynecology

## 2013-11-25 ENCOUNTER — Encounter: Payer: Self-pay | Admitting: Obstetrics & Gynecology

## 2013-11-25 NOTE — Progress Notes (Signed)
Patient ID: Jillian Mcpherson, female   DOB: [birth year], 23 y.o.   MRN: [id number] Incision clean dry intact Normal post op exam No more pain meds needed  Follow up 4 weeks for pp exam  Blood pressure 110/80, weight 257 lb (116.574 kg), last menstrual period 12/13/2012, unknown if currently breastfeeding.

## 2013-12-01 ENCOUNTER — Telehealth: Payer: Self-pay | Admitting: Obstetrics & Gynecology

## 2013-12-01 MED ORDER — OXYCODONE-ACETAMINOPHEN 5-325 MG PO TABS: 1.0000 | ORAL_TABLET | ORAL | Status: DC | PRN

## 2013-12-01 MED ORDER — IBUPROFEN 600 MG PO TABS
600.0000 mg | ORAL_TABLET | Freq: Four times a day (QID) | ORAL | Status: DC | PRN
Start: 2013-12-01 — End: 2013-12-20

## 2013-12-01 NOTE — Telephone Encounter (Signed)
Pt requesting refill for Ibuprofen and Percocet. Pt had a c-section on 11/18/2013 with Dr. Despina HiddenEure.

## 2013-12-01 NOTE — Telephone Encounter (Signed)
Pt informed percocet RX at front desk for pt to pick up. Ibuprofen e-scribed.

## 2013-12-14 ENCOUNTER — Telehealth: Payer: Self-pay | Admitting: Obstetrics & Gynecology

## 2013-12-14 MED ORDER — HYDROCODONE-ACETAMINOPHEN 5-325 MG PO TABS: 1.0000 | ORAL_TABLET | Freq: Four times a day (QID) | ORAL | Status: DC | PRN

## 2013-12-14 NOTE — Telephone Encounter (Signed)
Pt informed Rx for Lortab left a front desk for pt to pick up.

## 2013-12-14 NOTE — Telephone Encounter (Signed)
Pt states had c-section on 11/18/2013 requesting refill on percocet for pain above incision site and center abdomen, no drainage, no fever. Pain rated at 6 on 1-10 scale.

## 2013-12-20 ENCOUNTER — Encounter (HOSPITAL_COMMUNITY): Payer: Self-pay | Admitting: Emergency Medicine

## 2013-12-20 ENCOUNTER — Emergency Department (HOSPITAL_COMMUNITY)
Admission: EM | Admit: 2013-12-20 | Discharge: 2013-12-20 | Disposition: A | Discharge status: Home / Self Care | Payer: Medicaid Other | Attending: Emergency Medicine | Admitting: Emergency Medicine

## 2013-12-20 DIAGNOSIS — Z87891 Personal history of nicotine dependence: Secondary | ICD-10-CM | POA: Insufficient documentation

## 2013-12-20 DIAGNOSIS — M546 Pain in thoracic spine: Secondary | ICD-10-CM | POA: Insufficient documentation

## 2013-12-20 DIAGNOSIS — Z8659 Personal history of other mental and behavioral disorders: Secondary | ICD-10-CM | POA: Insufficient documentation

## 2013-12-20 DIAGNOSIS — M549 Dorsalgia, unspecified: Secondary | ICD-10-CM | POA: Insufficient documentation

## 2013-12-20 DIAGNOSIS — Z3202 Encounter for pregnancy test, result negative: Secondary | ICD-10-CM | POA: Insufficient documentation

## 2013-12-20 LAB — URINE MICROSCOPIC-ADD ON

## 2013-12-20 LAB — URINALYSIS, ROUTINE W REFLEX MICROSCOPIC
Bilirubin Urine: NEGATIVE
GLUCOSE, UA: NEGATIVE mg/dL
Ketones, ur: NEGATIVE mg/dL
Nitrite: NEGATIVE
Protein, ur: NEGATIVE mg/dL
Specific Gravity, Urine: 1.025 (ref 1.005–1.030)
Urobilinogen, UA: 0.2 mg/dL (ref 0.0–1.0)
pH: 6 (ref 5.0–8.0)

## 2013-12-20 LAB — POC URINE PREG, ED: Preg Test, Ur: NEGATIVE

## 2013-12-20 MED ORDER — IBUPROFEN 800 MG PO TABS
800.0000 mg | ORAL_TABLET | Freq: Once | ORAL | Status: AC
  Administered 2013-12-20: 800 mg via ORAL
  Filled 2013-12-20: qty 1

## 2013-12-20 MED ORDER — OXYCODONE-ACETAMINOPHEN 5-325 MG PO TABS
1.0000 | ORAL_TABLET | Freq: Once | ORAL | Status: AC
  Administered 2013-12-20: 1 via ORAL
  Filled 2013-12-20: qty 1

## 2013-12-20 NOTE — Discharge Instructions (Signed)
SEEK IMMEDIATE MEDICAL ATTENTION IF: New numbness, tingling, weakness, or problem with the use of your arms or legs.  Severe back pain not relieved with medications.  Change in bowel or bladder control (if you lose control of stool or urine, or if you are unable to urinate) Increasing pain in any areas of the body (such as chest or abdominal pain).  Shortness of breath, dizziness or fainting.  Nausea (feeling sick to your stomach), vomiting, fever, or sweats.   PLEASE FOLLOWUP THIS WEEK FOR FURTHER CHECKS OF YOUR BLOOD PRESSURE

## 2013-12-20 NOTE — ED Provider Notes (Signed)
CSN: [id number]     Arrival date & time 12/20/13  0317 History   First MD Initiated Contact with Patient 12/20/13 [phone]     Chief Complaint  Patient presents with  . Back Pain      Patient is a 23 y.o. female presenting with back pain. The history is provided by the patient.  Back Pain Pain location: upper back. Quality:  Aching Pain severity:  Mild Onset quality:  Gradual Duration: 45 minutes. Timing:  Constant Progression:  Unchanged Chronicity:  Recurrent Relieved by:  None tried Worsened by:  Movement Associated symptoms: no abdominal pain, no chest pain, no dysuria, no fever and no weakness   PT reports that she began having upper back pain about 45 minutes ago No falls/trauma No cp reported No fever No focal weakness No recent heavy lifting No h/o back issues previously No h/o cardiac disease/VTE  Pt with recent c-section in early July.  No new abdominal pain or any significant postpartum complications  Past Medical History  Diagnosis Date  . Depression    Past Surgical History  Procedure Laterality Date  . Tonsillectomy    . Cesarean section N/A 11/18/2013    Procedure: CESAREAN SECTION;  Surgeon: Lazaro Arms, MD;  Location: [facility];  Service: Obstetrics;  Laterality: N/A;   Family History  Problem Relation Age of Onset  . Hypertension Mother   . Heart disease Mother   . Hypertension Father   . Asthma Father   . Heart disease Maternal Grandmother   . COPD Paternal Grandmother   . Cancer Other     great grandmother   History  Substance Use Topics  . Smoking status: Former Smoker -- 1.00 packs/day    Types: Cigarettes  . Smokeless tobacco: Never Used  . Alcohol Use: No   OB History   Grav Para Term Preterm Abortions TAB SAB Ect Mult Living   1 1 1       1      Review of Systems  Constitutional: Negative for fever.  Cardiovascular: Negative for chest pain.  Gastrointestinal: Negative for abdominal pain.  Genitourinary: Negative for dysuria.   Musculoskeletal: Positive for back pain.  Neurological: Negative for weakness.      Allergies  Review of patient's allergies indicates no known allergies.  Home Medications   Prior to Admission medications   Medication Sig Start Date End Date Taking? Authorizing Provider  HYDROcodone-acetaminophen (NORCO/VICODIN) 5-325 MG per tablet Take 1 tablet by mouth every 6 (six) hours as needed. 12/14/13   Lazaro Arms, MD   BP 166/94  Pulse 54  Temp(Src) 98 F (36.7 C) (Oral)  Resp 20  Ht 5\' 4"  (1.626 m)  Wt 257 lb (116.574 kg)  BMI 44.09 kg/m2  SpO2 99%  LMP 11/18/2013 Physical Exam CONSTITUTIONAL: Well developed/well nourished HEAD: Normocephalic/atraumatic EYES: EOMI/PERRL ENMT: Mucous membranes moist NECK: supple no meningeal signs SPINE:entire spine nontender. Mild tenderness to bilateral thoracic paraspinal region No bruising or erythema noted.  No bruising/crepitance/stepoffs noted to spine Pain reproduced by movement of her torso CV: S1/S2 noted, no murmurs/rubs/gallops noted LUNGS: Lungs are clear to auscultation bilaterally, no apparent distress ABDOMEN: soft, nontender, no rebound or guarding. No RUQ tenderness GU:no cva tenderness NEURO: Pt is awake/alert, moves all extremitiesx4 Pt able to ambulate without difficulty She has full flex extension with equal power in bilateral hips/knees/ankles EXTREMITIES: pulses normal, full ROM SKIN: warm, color normal PSYCH: no abnormalities of mood noted  ED Course  Procedures  3:57 AM  Pt well appearing.  She is in no distress Reporting upper back pain, worse with movement She has no neuro deficits No abd tenderness (has h/o cholelithiasis) so I doubt acute cholecystitis No signs of PE (no hypoxia/tachycardia)  ADVISED PT TO F/U THIS WEEK FOR REPEAT BP CHECK Labs Review Labs Reviewed  URINALYSIS, ROUTINE W REFLEX MICROSCOPIC - Abnormal; Notable for the following:    Hgb urine dipstick LARGE (*)    Leukocytes, UA  SMALL (*)    All other components within normal limits  URINE MICROSCOPIC-ADD ON - Abnormal; Notable for the following:    Squamous Epithelial / LPF FEW (*)    Bacteria, UA FEW (*)    All other components within normal limits  POC URINE PREG, ED      MDM   Final diagnoses:  Bilateral thoracic back pain    Nursing notes including past medical history and social history reviewed and considered in documentation Labs/vital reviewed and considered Previous records reviewed and considered     Joya Gaskinsonald W Jameica Couts, MD 12/20/13 [phone]

## 2013-12-20 NOTE — ED Notes (Signed)
MD at bedside. 

## 2013-12-20 NOTE — ED Notes (Signed)
Pt c/o mid back pain extending across the back x 45 min.

## 2013-12-23 ENCOUNTER — Emergency Department (HOSPITAL_COMMUNITY): Payer: Medicaid Other

## 2013-12-23 ENCOUNTER — Emergency Department (HOSPITAL_COMMUNITY)
Admission: EM | Admit: 2013-12-23 | Discharge: 2013-12-23 | Disposition: A | Discharge status: Home / Self Care | Payer: Medicaid Other | Attending: Emergency Medicine | Admitting: Emergency Medicine

## 2013-12-23 ENCOUNTER — Encounter (HOSPITAL_COMMUNITY): Payer: Self-pay | Admitting: Emergency Medicine

## 2013-12-23 ENCOUNTER — Ambulatory Visit: Payer: Medicaid Other | Admitting: Obstetrics & Gynecology

## 2013-12-23 DIAGNOSIS — R1011 Right upper quadrant pain: Secondary | ICD-10-CM | POA: Diagnosis present

## 2013-12-23 DIAGNOSIS — K8020 Calculus of gallbladder without cholecystitis without obstruction: Secondary | ICD-10-CM

## 2013-12-23 DIAGNOSIS — Z8659 Personal history of other mental and behavioral disorders: Secondary | ICD-10-CM | POA: Insufficient documentation

## 2013-12-23 DIAGNOSIS — K8000 Calculus of gallbladder with acute cholecystitis without obstruction: Secondary | ICD-10-CM | POA: Insufficient documentation

## 2013-12-23 DIAGNOSIS — Z87891 Personal history of nicotine dependence: Secondary | ICD-10-CM | POA: Insufficient documentation

## 2013-12-23 DIAGNOSIS — R10811 Right upper quadrant abdominal tenderness: Secondary | ICD-10-CM

## 2013-12-23 LAB — CBC WITH DIFFERENTIAL/PLATELET
BASOS ABS: 0 10*3/uL (ref 0.0–0.1)
BASOS PCT: 0 % (ref 0–1)
Eosinophils Absolute: 0.4 10*3/uL (ref 0.0–0.7)
Eosinophils Relative: 3 % (ref 0–5)
HCT: 36.4 % (ref 36.0–46.0)
Hemoglobin: 11.8 g/dL — ABNORMAL LOW (ref 12.0–15.0)
Lymphocytes Relative: 28 % (ref 12–46)
Lymphs Abs: 4.1 10*3/uL — ABNORMAL HIGH (ref 0.7–4.0)
MCH: 25.9 pg — ABNORMAL LOW (ref 26.0–34.0)
MCHC: 32.4 g/dL (ref 30.0–36.0)
MCV: 80 fL (ref 78.0–100.0)
Monocytes Absolute: 0.7 10*3/uL (ref 0.1–1.0)
Monocytes Relative: 5 % (ref 3–12)
NEUTROS ABS: 9.3 10*3/uL — AB (ref 1.7–7.7)
Neutrophils Relative %: 64 % (ref 43–77)
Platelets: 405 10*3/uL — ABNORMAL HIGH (ref 150–400)
RBC: 4.55 MIL/uL (ref 3.87–5.11)
RDW: 14 % (ref 11.5–15.5)
WBC: 14.5 10*3/uL — ABNORMAL HIGH (ref 4.0–10.5)

## 2013-12-23 LAB — URINALYSIS, ROUTINE W REFLEX MICROSCOPIC
Bilirubin Urine: NEGATIVE
Glucose, UA: NEGATIVE mg/dL
Hgb urine dipstick: NEGATIVE
Ketones, ur: NEGATIVE mg/dL
NITRITE: NEGATIVE
PH: 6 (ref 5.0–8.0)
Protein, ur: NEGATIVE mg/dL
SPECIFIC GRAVITY, URINE: 1.015 (ref 1.005–1.030)
Urobilinogen, UA: 0.2 mg/dL (ref 0.0–1.0)

## 2013-12-23 LAB — COMPREHENSIVE METABOLIC PANEL
ALBUMIN: 3.8 g/dL (ref 3.5–5.2)
ALT: 13 U/L (ref 0–35)
AST: 15 U/L (ref 0–37)
Alkaline Phosphatase: 105 U/L (ref 39–117)
Anion gap: 11 (ref 5–15)
BUN: 14 mg/dL (ref 6–23)
CHLORIDE: 102 meq/L (ref 96–112)
CO2: 28 mEq/L (ref 19–32)
CREATININE: 0.85 mg/dL (ref 0.50–1.10)
Calcium: 9.5 mg/dL (ref 8.4–10.5)
GFR calc Af Amer: >90 mL/min (ref >90)
GFR calc non Af Amer: >90 mL/min (ref >90)
Glucose, Bld: 102 mg/dL — ABNORMAL HIGH (ref 70–99)
Potassium: 4.1 mEq/L (ref 3.7–5.3)
Sodium: 141 mEq/L (ref 137–147)
Total Bilirubin: 0.2 mg/dL — ABNORMAL LOW (ref 0.3–1.2)
Total Protein: 7.5 g/dL (ref 6.0–8.3)

## 2013-12-23 LAB — URINE MICROSCOPIC-ADD ON

## 2013-12-23 LAB — LIPASE, BLOOD: LIPASE: 36 U/L (ref 11–59)

## 2013-12-23 MED ORDER — METHYLPREDNISOLONE SODIUM SUCC 125 MG IJ SOLR: 125.0000 mg | Freq: Once | INTRAMUSCULAR | Status: DC

## 2013-12-23 MED ORDER — SODIUM CHLORIDE 0.9 % IV SOLN: INTRAVENOUS | Status: DC

## 2013-12-23 MED ORDER — SODIUM CHLORIDE 0.9 % IV SOLN
INTRAVENOUS | Status: DC
  Administered 2013-12-23: 10:00:00 via INTRAVENOUS

## 2013-12-23 MED ORDER — ONDANSETRON HCL 4 MG/2ML IJ SOLN
4.0000 mg | Freq: Once | INTRAMUSCULAR | Status: AC
  Administered 2013-12-23: 4 mg via INTRAVENOUS
  Filled 2013-12-23: qty 2

## 2013-12-23 MED ORDER — RANITIDINE HCL 150 MG PO TABS: 150.0000 mg | ORAL_TABLET | Freq: Two times a day (BID) | ORAL | Status: DC

## 2013-12-23 MED ORDER — HYDROMORPHONE HCL PF 1 MG/ML IJ SOLN
0.5000 mg | Freq: Once | INTRAMUSCULAR | Status: AC
  Administered 2013-12-23: 0.5 mg via INTRAVENOUS
  Filled 2013-12-23: qty 1

## 2013-12-23 NOTE — ED Provider Notes (Signed)
Medical screening examination/treatment/procedure(s) were performed by non-physician practitioner and as supervising physician I was immediately available for consultation/collaboration.   EKG Interpretation None        Kristen N Ward, DO 12/23/13 1624 

## 2013-12-23 NOTE — ED Provider Notes (Signed)
CSN: [id number]     Arrival date & time 12/23/13  0826 History   First MD Initiated Contact with Patient 12/23/13 [phone]     Chief Complaint  Patient presents with  . Abdominal Pain     (Consider location/radiation/quality/duration/timing/severity/associated sxs/prior Treatment) Patient is a 23 y.o. female presenting with abdominal pain. The history is provided by the patient.  Abdominal Pain Pain location:  Epigastric and RUQ Pain quality: squeezing   Pain radiates to:  Back Pain severity:  Severe Onset quality:  Gradual Duration:  2 hours Timing:  Constant Progression:  Worsening Chronicity:  New Context: eating   Relieved by:  Nothing Worsened by:  Eating Associated symptoms: nausea   Associated symptoms: no chest pain, no chills, no cough, no dysuria, no fever and no shortness of breath    Jillian Mcpherson is a 23 y.o. female who presents to the ED with RUQ abdominal pain that started approximately 6 am.  She states she was recently evaluated in the ED with a muscle strain in her back but this is different. The symptoms started about one hour after eating. She ate chicken nuggets at 12:30 am and then cereal at 5:00 am. States she has been feeling "a little down" lately. S/p C/S four weeks ago.    Past Medical History  Diagnosis Date  . Depression    Past Surgical History  Procedure Laterality Date  . Tonsillectomy    . Cesarean section N/A 11/18/2013    Procedure: CESAREAN SECTION;  Surgeon: Lazaro Arms, MD;  Location: [facility];  Service: Obstetrics;  Laterality: N/A;   Family History  Problem Relation Age of Onset  . Hypertension Mother   . Heart disease Mother   . Hypertension Father   . Asthma Father   . Heart disease Maternal Grandmother   . COPD Paternal Grandmother   . Cancer Other     great grandmother   History  Substance Use Topics  . Smoking status: Former Smoker -- 1.00 packs/day    Types: Cigarettes  . Smokeless tobacco: Never Used  . Alcohol Use: No    OB History   Grav Para Term Preterm Abortions TAB SAB Ect Mult Living   1 1 1       1      Review of Systems  Constitutional: Negative for fever and chills.  HENT: Negative.   Eyes: Negative for photophobia, pain and visual disturbance.  Respiratory: Negative for cough, shortness of breath and wheezing.   Cardiovascular: Negative for chest pain.  Gastrointestinal: Positive for nausea and abdominal pain.  Genitourinary: Negative for dysuria, urgency and frequency.  Musculoskeletal: Positive for back pain.  Skin: Negative for rash.  Neurological: Negative for syncope and headaches.  Psychiatric/Behavioral: Negative for confusion. Nervous/anxious: hx of depression.       Allergies  Review of patient's allergies indicates no known allergies.  Home Medications   Prior to Admission medications   Medication Sig Start Date End Date Taking? Authorizing Provider  ibuprofen (ADVIL,MOTRIN) 200 MG tablet Take 800 mg by mouth every 6 (six) hours as needed for moderate pain.   Yes Historical Provider, MD   BP 171/101  Pulse 73  Temp(Src) 98.3 F (36.8 C)  Resp 18  Ht 5\' 4"  (1.626 m)  Wt 257 lb (116.574 kg)  BMI 44.09 kg/m2  SpO2 100%  LMP 11/18/2013 Physical Exam  Nursing note and vitals reviewed. Constitutional: She is oriented to person, place, and time. She appears well-developed and well-nourished. No distress.  Eyes: Conjunctivae and EOM are normal.  Neck: Neck supple.  Pulmonary/Chest: Effort normal.  Abdominal: Soft. There is tenderness in the right upper quadrant and epigastric area. There is no rebound and no CVA tenderness.  C/S site healing well without signs of infection. Minimal tenderness with palpation over the site.   Musculoskeletal: Normal range of motion.  Neurological: She is alert and oriented to person, place, and time. No cranial nerve deficit.  Skin: Skin is warm and dry.  Psychiatric: She has a normal mood and affect. Her behavior is normal.   Results  for orders placed during the hospital encounter of 12/23/13 (from the past 24 hour(s))  URINALYSIS, ROUTINE W REFLEX MICROSCOPIC     Status: Abnormal   Collection Time    12/23/13  8:47 AM      Result Value Ref Range   Color, Urine YELLOW  YELLOW   APPearance CLEAR  CLEAR   Specific Gravity, Urine 1.015  1.005 - 1.030   pH 6.0  5.0 - 8.0   Glucose, UA NEGATIVE  NEGATIVE mg/dL   Hgb urine dipstick NEGATIVE  NEGATIVE   Bilirubin Urine NEGATIVE  NEGATIVE   Ketones, ur NEGATIVE  NEGATIVE mg/dL   Protein, ur NEGATIVE  NEGATIVE mg/dL   Urobilinogen, UA 0.2  0.0 - 1.0 mg/dL   Nitrite NEGATIVE  NEGATIVE   Leukocytes, UA SMALL (*) NEGATIVE  URINE MICROSCOPIC-ADD ON     Status: Abnormal   Collection Time    12/23/13  8:47 AM      Result Value Ref Range   Squamous Epithelial / LPF FEW (*) RARE   WBC, UA 3-6  <3 WBC/hpf   Bacteria, UA FEW (*) RARE  CBC WITH DIFFERENTIAL     Status: Abnormal   Collection Time    12/23/13  8:48 AM      Result Value Ref Range   WBC 14.5 (*) 4.0 - 10.5 K/uL   RBC 4.55  3.87 - 5.11 MIL/uL   Hemoglobin 11.8 (*) 12.0 - 15.0 g/dL   HCT 29.536.4  62.136.0 - 30.846.0 %   MCV 80.0  78.0 - 100.0 fL   MCH 25.9 (*) 26.0 - 34.0 pg   MCHC 32.4  30.0 - 36.0 g/dL   RDW 65.714.0  84.611.5 - 96.215.5 %   Platelets 405 (*) 150 - 400 K/uL   Neutrophils Relative % 64  43 - 77 %   Neutro Abs 9.3 (*) 1.7 - 7.7 K/uL   Lymphocytes Relative 28  12 - 46 %   Lymphs Abs 4.1 (*) 0.7 - 4.0 K/uL   Monocytes Relative 5  3 - 12 %   Monocytes Absolute 0.7  0.1 - 1.0 K/uL   Eosinophils Relative 3  0 - 5 %   Eosinophils Absolute 0.4  0.0 - 0.7 K/uL   Basophils Relative 0  0 - 1 %   Basophils Absolute 0.0  0.0 - 0.1 K/uL  COMPREHENSIVE METABOLIC PANEL     Status: Abnormal   Collection Time    12/23/13  8:48 AM      Result Value Ref Range   Sodium 141  137 - 147 mEq/L   Potassium 4.1  3.7 - 5.3 mEq/L   Chloride 102  96 - 112 mEq/L   CO2 28  19 - 32 mEq/L   Glucose, Bld 102 (*) 70 - 99 mg/dL   BUN 14  6  - 23 mg/dL   Creatinine, Ser 9.520.85  0.50 - 1.10 mg/dL  Calcium 9.5  8.4 - 10.5 mg/dL   Total Protein 7.5  6.0 - 8.3 g/dL   Albumin 3.8  3.5 - 5.2 g/dL   AST 15  0 - 37 U/L   ALT 13  0 - 35 U/L   Alkaline Phosphatase 105  39 - 117 U/L   Total Bilirubin 0.2 (*) 0.3 - 1.2 mg/dL   GFR calc non Af Amer >90  >90 mL/min   GFR calc Af Amer >90  >90 mL/min   Anion gap 11  5 - 15  LIPASE, BLOOD     Status: None   Collection Time    12/23/13  8:48 AM      Result Value Ref Range   Lipase 36  11 - 59 U/L    ED Course  Procedures US Abdomen Limited  12/23/2013   CLINICAL DATA:  Right upper quadrant pain, nausea  EXAM: US ABDOMEN LIMITED - RIGHT UPPER QUADRANT  COMPARISON:  Prior abdominal ultrasound 02/13/2013  FINDINGS: Gallbladder:  Mobile echogenic shadowing foci within the gallbladder lumen consistent with cholelithiasis. The gallbladder wall is within normal limits at 3 mm. No pericholecystic fluid. Per the sonographer, the sonographic Eulah Pont sign was negative.  Common bile duct:  Diameter: Within normal limits at 3.1 mm  Liver:  No focal lesion identified. Within normal limits in parenchymal echogenicity. The main portal vein is patent with normal hepatopetal flow.  IMPRESSION: Cholelithiasis without secondary sonographic findings to suggest acute cholecystitis.   Electronically Signed   By: Malachy Moan M.D.   On: 12/23/2013 10:11    MDM: discussed this case with Dr. Elesa Massed.   23 y.o. female with RUQ abdominal pain and nausea that started about 2 hours prior to arrival to the ED. Feeling better after treatment here with IV hydration, antiemetics and pain medication.   Will d/c home with referral to general surgery. I have reviewed this patient's vital signs, nurses notes, appropriate labs and imaging.  I have discussed findings with the patient and plan of care. She voices understanding.    Medication List    TAKE these medications       ranitidine 150 MG tablet  Commonly known as:  ZANTAC   Take 1 tablet (150 mg total) by mouth 2 (two) times daily.      ASK your doctor about these medications       ibuprofen 200 MG tablet  Commonly known as:  ADVIL,MOTRIN  Take 800 mg by mouth every 6 (six) hours as needed for moderate pain.       patient called back and ask for medication for nausea. Phenergan 25 mg tablets called in to [facility] in [place], [address] [place], [place] 12/23/13 [phone]

## 2013-12-23 NOTE — ED Notes (Signed)
Pt c/o ruq abd pain radiating into back since 0600, pt also reports nausea x 1 hour. Pt states she was seen in ED a few days ago and dx with muscle strain in back, but this feels different.

## 2013-12-23 NOTE — Discharge Instructions (Signed)
Your ultrasound today shows that you have gall stones but they are not causing a big problem at this time. Since you are having the pain after eating in the gall bladder area, you should make an appointment with the surgeon for follow up to discuss possibly taking out your gall bladder. Take the medication we have prescribed for you. Follow up with your doctor. Return here as needed.

## 2013-12-24 ENCOUNTER — Ambulatory Visit: Payer: Medicaid Other | Admitting: Obstetrics & Gynecology

## 2013-12-27 ENCOUNTER — Ambulatory Visit: Payer: Medicaid Other | Admitting: Women's Health

## 2013-12-28 ENCOUNTER — Ambulatory Visit (INDEPENDENT_AMBULATORY_CARE_PROVIDER_SITE_OTHER): Payer: Medicaid Other | Admitting: Women's Health

## 2013-12-28 ENCOUNTER — Encounter: Payer: Self-pay | Admitting: Women's Health

## 2013-12-28 DIAGNOSIS — R21 Rash and other nonspecific skin eruption: Secondary | ICD-10-CM

## 2013-12-28 DIAGNOSIS — K819 Cholecystitis, unspecified: Secondary | ICD-10-CM

## 2013-12-28 DIAGNOSIS — O99345 Other mental disorders complicating the puerperium: Secondary | ICD-10-CM

## 2013-12-28 DIAGNOSIS — F53 Postpartum depression: Secondary | ICD-10-CM

## 2013-12-28 MED ORDER — ESCITALOPRAM OXALATE 10 MG PO TABS: 10.0000 mg | ORAL_TABLET | Freq: Every day | ORAL | Status: DC

## 2013-12-28 MED ORDER — PREDNISONE 20 MG PO TABS: 40.0000 mg | ORAL_TABLET | Freq: Every day | ORAL | Status: DC

## 2013-12-28 NOTE — Patient Instructions (Signed)
Postpartum Depression and Baby Blues The postpartum period begins right after the birth of a baby. During this time, there is often a great amount of joy and excitement. It is also a time of many changes in the life of the parents. Regardless of how many times a mother gives birth, each child brings new challenges and dynamics to the family. It is not unusual to have feelings of excitement along with confusing shifts in moods, emotions, and thoughts. All mothers are at risk of developing postpartum depression or the "baby blues." These mood changes can occur right after giving birth, or they may occur many months after giving birth. The baby blues or postpartum depression can be mild or severe. Additionally, postpartum depression can go away rather quickly, or it can be a long-term condition.  CAUSES Raised hormone levels and the rapid drop in those levels are thought to be a main cause of postpartum depression and the baby blues. A number of hormones change during and after pregnancy. Estrogen and progesterone usually decrease right after the delivery of your baby. The levels of thyroid hormone and various cortisol steroids also rapidly drop. Other factors that play a role in these mood changes include major life events and genetics.  RISK FACTORS If you have any of the following risks for the baby blues or postpartum depression, know what symptoms to watch out for during the postpartum period. Risk factors that may increase the likelihood of getting the baby blues or postpartum depression include:  Having a personal or family history of depression.   Having depression while being pregnant.   Having premenstrual mood issues or mood issues related to oral contraceptives.  Having a lot of life stress.   Having marital conflict.   Lacking a social support network.   Having a baby with special needs.   Having health problems, such as diabetes.  SIGNS AND SYMPTOMS Symptoms of baby blues  include:  Brief changes in mood, such as going from extreme happiness to sadness.  Decreased concentration.   Difficulty sleeping.   Crying spells, tearfulness.   Irritability.   Anxiety.  Symptoms of postpartum depression typically begin within the first month after giving birth. These symptoms include:  Difficulty sleeping or excessive sleepiness.   Marked weight loss.   Agitation.   Feelings of worthlessness.   Lack of interest in activity or food.  Postpartum psychosis is a very serious condition and can be dangerous. Fortunately, it is rare. Displaying any of the following symptoms is cause for immediate medical attention. Symptoms of postpartum psychosis include:   Hallucinations and delusions.   Bizarre or disorganized behavior.   Confusion or disorientation.  DIAGNOSIS  A diagnosis is made by an evaluation of your symptoms. There are no medical or lab tests that lead to a diagnosis, but there are various questionnaires that a health care provider may use to identify those with the baby blues, postpartum depression, or psychosis. Often, a screening tool called the Edinburgh Postnatal Depression Scale is used to diagnose depression in the postpartum period.  TREATMENT The baby blues usually goes away on its own in 1-2 weeks. Social support is often all that is needed. You will be encouraged to get adequate sleep and rest. Occasionally, you may be given medicines to help you sleep.  Postpartum depression requires treatment because it can last several months or longer if it is not treated. Treatment may include individual or group therapy, medicine, or both to address any social, physiological, and psychological   factors that may play a role in the depression. Regular exercise, a healthy diet, rest, and social support may also be strongly recommended.  Postpartum psychosis is more serious and needs treatment right away. Hospitalization is often needed. HOME CARE  INSTRUCTIONS  Get as much rest as you can. Nap when the baby sleeps.   Exercise regularly. Some women find yoga and walking to be beneficial.   Eat a balanced and nourishing diet.   Do little things that you enjoy. Have a cup of tea, take a bubble bath, read your favorite magazine, or listen to your favorite music.  Avoid alcohol.   Ask for help with household chores, cooking, grocery shopping, or running errands as needed. Do not try to do everything.   Talk to people close to you about how you are feeling. Get support from your partner, family members, friends, or other new moms.  Try to stay positive in how you think. Think about the things you are grateful for.   Do not spend a lot of time alone.   Only take over-the-counter or prescription medicine as directed by your health care provider.  Keep all your postpartum appointments.   Let your health care provider know if you have any concerns.  SEEK MEDICAL CARE IF: You are having a reaction to or problems with your medicine. SEEK IMMEDIATE MEDICAL CARE IF:  You have suicidal feelings.   You think you may harm the baby or someone else. MAKE SURE YOU:  Understand these instructions.  Will watch your condition.  Will get help right away if you are not doing well or get worse. Document Released: 02/08/2004 Document Revised: 05/11/2013 Document Reviewed: 02/15/2013 Exit[employer] Patient Information 2015 [employer]. This information is not intended to replace advice given to you by your health care provider. Make sure you discuss any questions you have with your health care provider.  

## 2013-12-28 NOTE — Progress Notes (Signed)
Patient ID: Jillian Mcpherson, female   DOB: February 07, 1991, 23 y.o.   MRN: [id number] Subjective:    Jillian Mcpherson is a 23 y.o. [id number] Caucasian female who presents for a postpartum visit. She is 5 weeks postpartum following a primary cesarean section, low transverse incision at 39 gestational weeks, PLTCS for breech presentation- declined ECV. Anesthesia: spinal. I have fully reviewed the prenatal and intrapartum course. Postpartum course has been complicated by depression. Baby's course has been uncomplicated. Baby is feeding by bottle. Bleeding on period now. Bowel function is normal. Bladder function is normal. Patient tried to 'fool around' the other night, but was uncomfortable, so stopped sexually active. Contraception method is condoms. Postpartum depression screening: positive. Score 17.  H/O depression, used to be on zoloft, doesn't remember if it helped, [facility], but has been a very long time. Denies SI/HI/II, just feels 'very down' most days. Desires meds. Last pap 08/09/13 and was neg. Rash on abdomen began while still in hospital under umbilicus, then moved up to stretch marks, itchy, hasn't tried anything OTC.  Problems w/ gallbladder, was seen in ED and referred to surgeon in [place] who doesn't accept pregnancy [place], wants referral.   The following portions of the patient's history were reviewed and updated as appropriate: allergies, current medications, past medical history, past surgical history and problem list.  Review of Systems Pertinent items are noted in HPI.   Filed Vitals:   12/28/13 1529  BP: 130/90  Height: 5\' 4"  (1.626 m)  Weight: 236 lb (107.049 kg)  BP recheck: 122/82  Patient's last menstrual period was 12/24/2013.  Objective:   General:  alert, cooperative and no distress   Breasts:  deferred, no complaints  Lungs: clear to auscultation bilaterally  Heart:  regular rate and rhythm  Abdomen: soft, nontender, incision healing well, rash- red  bumps in striae of abd- appears to be PUPPS,  co-exam w/ LHE- will tx w/ prednisone   Vulva: normal  Vagina: normal vagina  Cervix:  closed  Corpus: Well-involuted  Adnexa:  Non-palpable  Rectal Exam: No hemorrhoids        Assessment:   Postpartum exam 5 wks s/p PLTCS for breech declined ECV Bottlefeeding Depression screening Contraception counseling  PPD Pruritic rash on abdomen Cholecystitis  Plan:  PPD Rx lexapro 10mg  daily Rx prednisone 40mg  daily x 10d for rash on abd Will refer to Dr. Lovell SheehanJenkins for possible cholecystectomy Contraception: condoms, recommended 18mths between pregnancies Follow up in: 4 weeks to see how she's doing on lexapro or earlier if needed  Marge DuncansBooker, Prudence Heiny Randall CNM, [facility]NP-BC 12/28/2013 3:53 PM

## 2013-12-29 ENCOUNTER — Telehealth: Payer: Self-pay | Admitting: *Deleted

## 2013-12-29 ENCOUNTER — Emergency Department (HOSPITAL_COMMUNITY)
Admission: EM | Admit: 2013-12-29 | Discharge: 2013-12-29 | Disposition: A | Discharge status: Home / Self Care | Payer: Medicaid Other | Attending: Emergency Medicine | Admitting: Emergency Medicine

## 2013-12-29 ENCOUNTER — Encounter (HOSPITAL_COMMUNITY): Payer: Self-pay | Admitting: Emergency Medicine

## 2013-12-29 DIAGNOSIS — R109 Unspecified abdominal pain: Secondary | ICD-10-CM | POA: Diagnosis present

## 2013-12-29 DIAGNOSIS — Z9889 Other specified postprocedural states: Secondary | ICD-10-CM | POA: Insufficient documentation

## 2013-12-29 DIAGNOSIS — F411 Generalized anxiety disorder: Secondary | ICD-10-CM | POA: Insufficient documentation

## 2013-12-29 DIAGNOSIS — K8000 Calculus of gallbladder with acute cholecystitis without obstruction: Secondary | ICD-10-CM | POA: Insufficient documentation

## 2013-12-29 DIAGNOSIS — Z87891 Personal history of nicotine dependence: Secondary | ICD-10-CM | POA: Insufficient documentation

## 2013-12-29 DIAGNOSIS — F329 Major depressive disorder, single episode, unspecified: Secondary | ICD-10-CM | POA: Diagnosis not present

## 2013-12-29 DIAGNOSIS — Z79899 Other long term (current) drug therapy: Secondary | ICD-10-CM | POA: Insufficient documentation

## 2013-12-29 DIAGNOSIS — IMO0002 Reserved for concepts with insufficient information to code with codable children: Secondary | ICD-10-CM | POA: Insufficient documentation

## 2013-12-29 DIAGNOSIS — K8020 Calculus of gallbladder without cholecystitis without obstruction: Secondary | ICD-10-CM

## 2013-12-29 DIAGNOSIS — F3289 Other specified depressive episodes: Secondary | ICD-10-CM | POA: Insufficient documentation

## 2013-12-29 DIAGNOSIS — Z3202 Encounter for pregnancy test, result negative: Secondary | ICD-10-CM | POA: Insufficient documentation

## 2013-12-29 LAB — CBC WITH DIFFERENTIAL/PLATELET
Basophils Absolute: 0 10*3/uL (ref 0.0–0.1)
Basophils Relative: 0 % (ref 0–1)
Eosinophils Absolute: 0.5 10*3/uL (ref 0.0–0.7)
Eosinophils Relative: 4 % (ref 0–5)
HCT: 36.8 % (ref 36.0–46.0)
Hemoglobin: 12.2 g/dL (ref 12.0–15.0)
Lymphocytes Relative: 28 % (ref 12–46)
Lymphs Abs: 3.7 10*3/uL (ref 0.7–4.0)
MCH: 26.5 pg (ref 26.0–34.0)
MCHC: 33.2 g/dL (ref 30.0–36.0)
MCV: 79.8 fL (ref 78.0–100.0)
Monocytes Absolute: 0.9 10*3/uL (ref 0.1–1.0)
Monocytes Relative: 7 % (ref 3–12)
NEUTROS ABS: 8.2 10*3/uL — AB (ref 1.7–7.7)
NEUTROS PCT: 61 % (ref 43–77)
PLATELETS: 457 10*3/uL — AB (ref 150–400)
RBC: 4.61 MIL/uL (ref 3.87–5.11)
RDW: 13.7 % (ref 11.5–15.5)
WBC: 13.3 10*3/uL — ABNORMAL HIGH (ref 4.0–10.5)

## 2013-12-29 LAB — URINE MICROSCOPIC-ADD ON

## 2013-12-29 LAB — COMPREHENSIVE METABOLIC PANEL
ALK PHOS: 110 U/L (ref 39–117)
ALT: 20 U/L (ref 0–35)
AST: 16 U/L (ref 0–37)
Albumin: 3.9 g/dL (ref 3.5–5.2)
Anion gap: 12 (ref 5–15)
BUN: 11 mg/dL (ref 6–23)
CO2: 28 mEq/L (ref 19–32)
Calcium: 10.1 mg/dL (ref 8.4–10.5)
Chloride: 100 mEq/L (ref 96–112)
Creatinine, Ser: 0.82 mg/dL (ref 0.50–1.10)
GFR calc Af Amer: >90 mL/min (ref >90)
GFR calc non Af Amer: >90 mL/min (ref >90)
GLUCOSE: 101 mg/dL — AB (ref 70–99)
POTASSIUM: 4.1 meq/L (ref 3.7–5.3)
SODIUM: 140 meq/L (ref 137–147)
TOTAL PROTEIN: 7.7 g/dL (ref 6.0–8.3)
Total Bilirubin: 0.2 mg/dL — ABNORMAL LOW (ref 0.3–1.2)

## 2013-12-29 LAB — URINALYSIS, ROUTINE W REFLEX MICROSCOPIC
Bilirubin Urine: NEGATIVE
GLUCOSE, UA: NEGATIVE mg/dL
Nitrite: NEGATIVE
PH: 6 (ref 5.0–8.0)
Protein, ur: 100 mg/dL — AB
Specific Gravity, Urine: 1.025 (ref 1.005–1.030)
Urobilinogen, UA: 0.2 mg/dL (ref 0.0–1.0)

## 2013-12-29 LAB — POC URINE PREG, ED: PREG TEST UR: NEGATIVE

## 2013-12-29 LAB — LIPASE, BLOOD: Lipase: 30 U/L (ref 11–59)

## 2013-12-29 MED ORDER — OXYCODONE-ACETAMINOPHEN 5-325 MG PO TABS
1.0000 | ORAL_TABLET | Freq: Once | ORAL | Status: AC
  Administered 2013-12-29: 1 via ORAL
  Filled 2013-12-29: qty 1

## 2013-12-29 MED ORDER — HYDROMORPHONE HCL PF 1 MG/ML IJ SOLN
1.0000 mg | Freq: Once | INTRAMUSCULAR | Status: AC
  Administered 2013-12-29: 1 mg via INTRAMUSCULAR
  Filled 2013-12-29: qty 1

## 2013-12-29 MED ORDER — HYDROMORPHONE HCL 2 MG PO TABS: 2.0000 mg | ORAL_TABLET | ORAL | Status: DC | PRN

## 2013-12-29 NOTE — ED Provider Notes (Signed)
CSN: [id number]     Arrival date & time 12/29/13  0757 History   First MD Initiated Contact with Patient 12/29/13 0804     Chief Complaint  Patient presents with  . Abdominal Pain     (Consider location/radiation/quality/duration/timing/severity/associated sxs/prior Treatment) HPI  Jillian Mcpherson is a 23 y.o. female who is currently 5 weeks post partum after a scheduled c section due to breach position presenting another episode of right upper quadrant pain and nausea which started around 5 am,  An hour after eating an egg and cheese sandwich.  She was seen here for similar symptoms last week at which time an US revealed cholelithiasis without cholecystitis.  She had nausea which has resolved after taking phenergan.  Her pain radiates around her side and into her mid right back.  She denies dysuria, hematuria, fevers, chills, vomiting.  She is awaiting a referral to Dr Lovell Sheehan which is being arranged by her obgyn, whom she saw yesterday for her post partum f/u.    Past Medical History  Diagnosis Date  . Depression    Past Surgical History  Procedure Laterality Date  . Tonsillectomy    . Cesarean section N/A 11/18/2013    Procedure: CESAREAN SECTION;  Surgeon: Lazaro Arms, MD;  Location: [facility];  Service: Obstetrics;  Laterality: N/A;   Family History  Problem Relation Age of Onset  . Hypertension Mother   . Heart disease Mother   . Hypertension Father   . Asthma Father   . Heart disease Maternal Grandmother   . COPD Paternal Grandmother   . Cancer Other     great grandmother   History  Substance Use Topics  . Smoking status: Former Smoker -- 1.00 packs/day    Types: Cigarettes  . Smokeless tobacco: Never Used  . Alcohol Use: No   OB History   Grav Para Term Preterm Abortions TAB SAB Ect Mult Living   1 1 1       1      Review of Systems  Constitutional: Negative for fever and chills.  HENT: Negative for congestion and sore throat.   Eyes: Negative.   Respiratory:  Negative for chest tightness and shortness of breath.   Cardiovascular: Negative for chest pain.  Gastrointestinal: Positive for nausea and abdominal pain. Negative for vomiting.  Genitourinary: Negative.   Musculoskeletal: Negative for arthralgias, joint swelling and neck pain.  Skin: Negative.  Negative for rash and wound.  Neurological: Negative for dizziness, weakness, light-headedness, numbness and headaches.  Psychiatric/Behavioral: Negative.       Allergies  Review of patient's allergies indicates no known allergies.  Home Medications   Prior to Admission medications   Medication Sig Start Date End Date Taking? Authorizing Provider  ibuprofen (ADVIL,MOTRIN) 200 MG tablet Take 1,000 mg by mouth every 8 (eight) hours as needed for moderate pain.    Yes Historical Provider, MD  promethazine (PHENERGAN) 25 MG tablet Take 25 mg by mouth as needed for nausea or vomiting.   Yes Historical Provider, MD  ranitidine (ZANTAC) 150 MG tablet Take 1 tablet (150 mg total) by mouth 2 (two) times daily. 12/23/13  Yes Hope Orlene Och, NP  escitalopram (LEXAPRO) 10 MG tablet Take 1 tablet (10 mg total) by mouth daily. 12/28/13   Marge Duncans, CNM  HYDROmorphone (DILAUDID) 2 MG tablet Take 1-2 tablets (2-4 mg total) by mouth every 4 (four) hours as needed for severe pain. 12/29/13   Burgess Amor, PA-C  predniSONE (DELTASONE) 20 MG  tablet Take 2 tablets (40 mg total) by mouth daily with breakfast. X 10 days 12/28/13   Marge DuncansKimberly Randall Booker, CNM   BP 140/97  Pulse 82  Temp(Src) 98.4 F (36.9 C) (Oral)  Resp 18  SpO2 100%  LMP 12/24/2013 Physical Exam  Nursing note and vitals reviewed. Constitutional: She appears well-developed and well-nourished.  HENT:  Head: Normocephalic and atraumatic.  Eyes: Conjunctivae are normal.  Neck: Normal range of motion.  Cardiovascular: Normal rate, regular rhythm, normal heart sounds and intact distal pulses.   Pulmonary/Chest: Effort normal and breath  sounds normal. She has no wheezes.  Abdominal: Soft. Bowel sounds are normal. She exhibits no mass. There is tenderness in the right upper quadrant. There is no rebound, no guarding, no CVA tenderness and negative Murphy's sign.  Well healed low transverse c section incision.  Musculoskeletal: Normal range of motion.  Neurological: She is alert.  Skin: Skin is warm and dry.  Psychiatric: She has a normal mood and affect.    ED Course  Procedures (including critical care time) Labs Review Labs Reviewed  CBC WITH DIFFERENTIAL - Abnormal; Notable for the following:    WBC 13.3 (*)    Platelets 457 (*)    Neutro Abs 8.2 (*)    All other components within normal limits  COMPREHENSIVE METABOLIC PANEL - Abnormal; Notable for the following:    Glucose, Bld 101 (*)    Total Bilirubin 0.2 (*)    All other components within normal limits  URINALYSIS, ROUTINE W REFLEX MICROSCOPIC - Abnormal; Notable for the following:    Hgb urine dipstick LARGE (*)    Ketones, ur TRACE (*)    Protein, ur 100 (*)    Leukocytes, UA SMALL (*)    All other components within normal limits  URINE MICROSCOPIC-ADD ON - Abnormal; Notable for the following:    Squamous Epithelial / LPF MANY (*)    Bacteria, UA MANY (*)    All other components within normal limits  URINE CULTURE  LIPASE, BLOOD  POC URINE PREG, ED    Imaging Review No results found.   EKG Interpretation None      MDM   Final diagnoses:  Gallstones   Patients labs and/or radiological studies were viewed and considered during the medical decision making and disposition process.  10:53 AM No relief from percocet.  Dilaudid 1 mg IM ordered.  Pt denies dysuria.  Discussed urine findings.  Culture sent. Currently menstruating,  Not clean catch specimen.  No sx suggesting uti.  10:53 AM Discussed patient with Dr. Lovell SheehanJenkins RN.  He is currently in surgery.  Pending his eval of patient. 10:53 AM Discussed with Dr Lovell SheehanJenkins.  Dilaudid PO,  He  will see in his office tomorrow 10:30,  Plan for surgery in 2 days.  Pt stable for dc home.    Burgess AmorJulie Alyshia Kernan, PA-C 12/29/13 1053  Burgess AmorJulie Jadan Rouillard, PA-C 12/29/13 1053  Burgess AmorJulie Calle Schader, PA-C 12/29/13 [phone]

## 2013-12-29 NOTE — Telephone Encounter (Signed)
Called Dr. Lovell SheehanJenkins office to refer pt for gallbladder removal, his office faxed a referral form to be filled out and faxed back to them and they will contact pt with appointment date and time.

## 2013-12-29 NOTE — Discharge Instructions (Signed)
Cholelithiasis Cholelithiasis (also called gallstones) is a form of gallbladder disease in which gallstones form in your gallbladder. The gallbladder is an organ that stores bile made in the liver, which helps digest fats. Gallstones begin as small crystals and slowly grow into stones. Gallstone pain occurs when the gallbladder spasms and a gallstone is blocking the duct. Pain can also occur when a stone passes out of the duct.  RISK FACTORS  Being female.   Having multiple pregnancies. Health care providers sometimes advise removing diseased gallbladders before future pregnancies.   Being obese.  Eating a diet heavy in fried foods and fat.   Being older than 60 years and increasing age.   Prolonged use of medicines containing female hormones.   Having diabetes mellitus.   Rapidly losing weight.   Having a family history of gallstones (heredity).  SYMPTOMS  Nausea.   Vomiting.  Abdominal pain.   Yellowing of the skin (jaundice).   Sudden pain. It may persist from several minutes to several hours.  Fever.   Tenderness to the touch. In some cases, when gallstones do not move into the bile duct, people have no pain or symptoms. These are called "silent" gallstones.  TREATMENT Silent gallstones do not need treatment. In severe cases, emergency surgery may be required. Options for treatment include:  Surgery to remove the gallbladder. This is the most common treatment.  Medicines. These do not always work and may take 6-12 months or more to work.  Shock wave treatment (extracorporeal biliary lithotripsy). In this treatment an ultrasound machine sends shock waves to the gallbladder to break gallstones into smaller pieces that can pass into the intestines or be dissolved by medicine. HOME CARE INSTRUCTIONS   Only take over-the-counter or prescription medicines for pain, discomfort, or fever as directed by your health care provider.   Follow a low-fat diet until  seen again by your health care provider. Fat causes the gallbladder to contract, which can result in pain.   Follow up with your health care provider as directed. Attacks are almost always recurrent and surgery is usually required for permanent treatment.  SEEK IMMEDIATE MEDICAL CARE IF:   Your pain increases and is not controlled by medicines.   You have a fever or persistent symptoms for more than 2-3 days.   You have a fever and your symptoms suddenly get worse.   You have persistent nausea and vomiting.  MAKE SURE YOU:   Understand these instructions.  Will watch your condition.  Will get help right away if you are not doing well or get worse. Document Released: 05/02/2005 Document Revised: 01/06/2013 Document Reviewed: 10/28/2012 [facility] Patient Information 2015 [place]. This information is not intended to replace advice given to you by your health care provider. Make sure you discuss any questions you have with your health care provider.   Use the medicine prescribed if needed for pain.  Use caution with this as it will make you sleepy.  See Dr Lovell SheehanJenkins tomorrow morning in his office at 10:30.  Avoid eating any fatty foods.

## 2013-12-29 NOTE — ED Notes (Signed)
Pt here the other day and dx with cholecystitis. States Dance movement psychotherapistcalled surgeon and was told they could not help her due to having only pregnancy medicaid. Pt c/o ruq pain since 5am. Nausea with no vomiting. Took prescribed nausea meds this am. Mm wet. NAD

## 2013-12-29 NOTE — ED Provider Notes (Signed)
Medical screening examination/treatment/procedure(s) were performed by non-physician practitioner and as supervising physician I was immediately available for consultation/collaboration.   EKG Interpretation None        Saniyah Mondesir L Andree Golphin, MD 12/29/13 1537 

## 2013-12-30 ENCOUNTER — Ambulatory Visit: Payer: Medicaid Other | Admitting: Advanced Practice Midwife

## 2013-12-30 ENCOUNTER — Encounter (HOSPITAL_COMMUNITY): Payer: Self-pay | Admitting: Pharmacy Technician

## 2013-12-30 LAB — URINE CULTURE: Colony Count: 80000

## 2013-12-30 NOTE — Patient Instructions (Signed)
Jillian Mcpherson  12/30/2013   Your procedure is scheduled on:  01/03/2014  [facility] at  940  AM.  Call this number if you have problems the morning of surgery: [phone]   Remember:   Do not eat food or drink liquids after midnight.   Take these medicines the morning of surgery with A SIP OF WATER: lexapro, hydromorphone, deltazone, phenergan, zantac   Do not wear jewelry, make-up or nail polish.  Do not wear lotions, powders, or perfumes.   Do not shave 48 hours prior to surgery. Men may shave face and neck.  Do not bring valuables to the hospital.  [facility] any belongings or valuables.               Contacts, dentures or bridgework may not be worn into surgery.  Leave suitcase in the car. After surgery it may be brought to your room.  For patients admitted to the hospital, discharge time is determined by your treatment team.               Patients discharged the day of surgery will not be allowed to drive home.  Name and phone number of your driver: family  Special Instructions: Shower using CHG 2 nights before surgery and the night before surgery.  If you shower the day of surgery use CHG.  Use special wash - you have one bottle of CHG for all showers.  You should use approximately 1/3 of the bottle for each shower.   Please read over the following fact sheets that you were given: Pain Booklet, Coughing and Deep Breathing, Surgical Site Infection Prevention, Anesthesia Post-op Instructions and Care and Recovery After Surgery Laparoscopic Cholecystectomy Laparoscopic cholecystectomy is surgery to remove the gallbladder. The gallbladder is located in the upper right part of the abdomen, behind the liver. It is a storage sac for bile produced in the liver. Bile aids in the digestion and absorption of fats. Cholecystectomy is often done for inflammation of the gallbladder (cholecystitis). This condition is usually caused by a buildup of gallstones  (cholelithiasis) in your gallbladder. Gallstones can block the flow of bile, resulting in inflammation and pain. In severe cases, emergency surgery may be required. When emergency surgery is not required, you will have time to prepare for the procedure. Laparoscopic surgery is an alternative to open surgery. Laparoscopic surgery has a shorter recovery time. Your common bile duct may also need to be examined during the procedure. If stones are found in the common bile duct, they may be removed. LET [facility] PROVIDER KNOW ABOUT:  Any allergies you have.  All medicines you are taking, including vitamins, herbs, eye drops, creams, and over-the-counter medicines.  Previous problems you or members of your family have had with the use of anesthetics.  Any blood disorders you have.  Previous surgeries you have had.  Medical conditions you have. RISKS AND COMPLICATIONS Generally, this is a safe procedure. However, as with any procedure, complications can occur. Possible complications include:  Infection.  Damage to the common bile duct, nerves, arteries, veins, or other internal organs such as the stomach, liver, or intestines.  Bleeding.  A stone may remain in the common bile duct.  A bile leak from the cyst duct that is clipped when your gallbladder is removed.  The need to convert to open surgery, which requires a larger incision in the abdomen. This may be necessary if your surgeon  thinks it is not safe to continue with a laparoscopic procedure. BEFORE THE PROCEDURE  Ask your health care provider about changing or stopping any regular medicines. You will need to stop taking aspirin or blood thinners at least 5 days prior to surgery.  Do not eat or drink anything after midnight the night before surgery.  Let your health care provider know if you develop a cold or other infectious problem before surgery. PROCEDURE   You will be given medicine to make you sleep through the  procedure (general anesthetic). A breathing tube will be placed in your mouth.  When you are asleep, your surgeon will make several small cuts (incisions) in your abdomen.  A thin, lighted tube with a tiny camera on the end (laparoscope) is inserted through one of the small incisions. The camera on the laparoscope sends a picture to a TV screen in the operating room. This gives the surgeon a good view inside your abdomen.  A gas will be pumped into your abdomen. This expands your abdomen so that the surgeon has more room to perform the surgery.  Other tools needed for the procedure are inserted through the other incisions. The gallbladder is removed through one of the incisions.  After the removal of your gallbladder, the incisions will be closed with stitches, staples, or skin glue. AFTER THE PROCEDURE  You will be taken to a recovery area where your progress will be checked often.  You may be allowed to go home the same day if your pain is controlled and you can tolerate liquids. Document Released: 05/06/2005 Document Revised: 02/24/2013 Document Reviewed: 12/16/2012 [facility] Patient Information 2015 [place], [place]. This information is not intended to replace advice given to you by your health care provider. Make sure you discuss any questions you have with your health care provider. PATIENT INSTRUCTIONS POST-ANESTHESIA  IMMEDIATELY FOLLOWING SURGERY:  Do not drive or operate machinery for the first twenty four hours after surgery.  Do not make any important decisions for twenty four hours after surgery or while taking narcotic pain medications or sedatives.  If you develop intractable nausea and vomiting or a severe headache please notify your doctor immediately.  FOLLOW-UP:  Please make an appointment with your surgeon as instructed. You do not need to follow up with anesthesia unless specifically instructed to do so.  WOUND CARE INSTRUCTIONS (if applicable):  Keep a dry clean dressing  on the anesthesia/puncture wound site if there is drainage.  Once the wound has quit draining you may leave it open to air.  Generally you should leave the bandage intact for twenty four hours unless there is drainage.  If the epidural site drains for more than 36-48 hours please call the anesthesia department.  QUESTIONS?:  Please feel free to call your physician or the hospital operator if you have any questions, and they will be happy to assist you.

## 2013-12-31 ENCOUNTER — Encounter (HOSPITAL_COMMUNITY)
Admission: RE | Admit: 2013-12-31 | Discharge: 2013-12-31 | Disposition: A | Discharge status: Home / Self Care | Payer: Medicaid Other | Source: Ambulatory Visit | Attending: General Surgery | Admitting: General Surgery

## 2013-12-31 ENCOUNTER — Encounter (HOSPITAL_COMMUNITY): Payer: Self-pay

## 2013-12-31 DIAGNOSIS — K219 Gastro-esophageal reflux disease without esophagitis: Secondary | ICD-10-CM | POA: Diagnosis not present

## 2013-12-31 DIAGNOSIS — K8010 Calculus of gallbladder with chronic cholecystitis without obstruction: Secondary | ICD-10-CM | POA: Diagnosis present

## 2013-12-31 DIAGNOSIS — F341 Dysthymic disorder: Secondary | ICD-10-CM | POA: Diagnosis not present

## 2013-12-31 DIAGNOSIS — Z79899 Other long term (current) drug therapy: Secondary | ICD-10-CM | POA: Diagnosis not present

## 2013-12-31 DIAGNOSIS — Z6839 Body mass index (BMI) 39.0-39.9, adult: Secondary | ICD-10-CM | POA: Diagnosis not present

## 2013-12-31 HISTORY — DX: Anxiety disorder, unspecified: F41.9

## 2013-12-31 NOTE — H&P (Signed)
  NTS SOAP Note  Vital Signs:  Vitals as of: 12/30/2013: Systolic 164: Diastolic 98: Heart Rate 90: Temp 98.72F: Height 635ft 4in: Weight 233Lbs 0 Ounces: BMI 39.99  BMI : 39.99 kg/m2  Subjective: This [age] Years 4911 Months old Female presents for of abdominal pain.  Has had abdominal pain over the past few days.  Seen in ER.  U/S of gallbladder shows cholelithiasis, normal common bile duct.  No fever, chills, jaundice.  Is five weeks postpartum.  Review of Symptoms:  Constitutional:unremarkable   Head:unremarkable    Eyes:unremarkable   Nose/Mouth/Throat:unremarkable Cardiovascular:  unremarkable   Respiratory:unremarkable   Gastrointestin    abdominal pain,nausea,vomiting,heartburn Genitourinary:unremarkable       back pain pruritis Hematolgic/Lymphatic:unremarkable     Allergic/Immunologic:unremarkable     Past Medical History:    Reviewed  Past Medical History  Surgical History: c section, t and a Medical Problems: unremarkable Allergies: nkda Medications: estrogen, prednisone for itching, compazine, zantac, dilaudid   Social History:Reviewed  Social History  Preferred Language: English Race:  White Ethnicity: Not Hispanic / Latino Age: [age] Years 11 Months Marital Status:  S Alcohol: no   Smoking Status: Never smoker reviewed on 12/30/2013 Functional Status reviewed on 12/30/2013 ------------------------------------------------ Bathing: Normal Cooking: Normal Dressing: Normal Driving: Normal Eating: Normal Managing Meds: Normal Oral Care: Normal Shopping: Normal Toileting: Normal Transferring: Normal Walking: Normal Cognitive Status reviewed on 12/30/2013 ------------------------------------------------ Attention: Normal Decision Making: Normal Language: Normal Memory: Normal Motor: Normal Perception: Normal Problem Solving: Normal Visual and Spatial: Normal   Family History:  Reviewed  Family Health  History Mother, Living; Healthy;  Father, Living; Healthy;     Objective Information: General:  Well appearing, well nourished in no distress. Heart:  RRR, no murmur or gallop.  Normal S1, S2.  No S3, S4.  Lungs:    CTA bilaterally, no wheezes, rhonchi, rales.  Breathing unlabored. Abdomen:Soft, slightly tender in right upper quadrant to deep palpation, ND, no HSM, no masses.  Assessment:Cholecystitis, cholelithiasis  Diagnoses: 574.00 Calculus of gallbladder with acute cholecystitis (Calculus of gallbladder with acute cholecystitis without obstruction)  Procedures: [id number] - OFFICE OUTPATIENT NEW 30 MINUTES    Plan:  Scheduled for laparoscopic cholecystectomy on 01/03/14.   Patient Education:Alternative treatments to surgery were discussed with patient (and family).  Risks and benefits  of procedure including bleeding, infection, hepatobiliary injury, and the possibility of an open procedure were fully explained to the patient (and family) who gave informed consent. Patient/family questions were addressed.  Follow-up:Pending Surgery

## 2014-01-03 ENCOUNTER — Ambulatory Visit (HOSPITAL_COMMUNITY): Payer: Medicaid Other | Admitting: Anesthesiology

## 2014-01-03 ENCOUNTER — Encounter (HOSPITAL_COMMUNITY)
Admission: RE | Disposition: A | Discharge status: Home / Self Care | Payer: Self-pay | Source: Ambulatory Visit | Attending: General Surgery

## 2014-01-03 ENCOUNTER — Ambulatory Visit (HOSPITAL_COMMUNITY)
Admission: RE | Admit: 2014-01-03 | Discharge: 2014-01-03 | Disposition: A | Discharge status: Home / Self Care | Payer: Medicaid Other | Source: Ambulatory Visit | Attending: General Surgery | Admitting: General Surgery

## 2014-01-03 ENCOUNTER — Encounter (HOSPITAL_COMMUNITY): Payer: Medicaid Other | Admitting: Anesthesiology

## 2014-01-03 ENCOUNTER — Encounter (HOSPITAL_COMMUNITY): Payer: Self-pay | Admitting: Anesthesiology

## 2014-01-03 DIAGNOSIS — K219 Gastro-esophageal reflux disease without esophagitis: Secondary | ICD-10-CM | POA: Insufficient documentation

## 2014-01-03 DIAGNOSIS — Z79899 Other long term (current) drug therapy: Secondary | ICD-10-CM | POA: Insufficient documentation

## 2014-01-03 DIAGNOSIS — Z6839 Body mass index (BMI) 39.0-39.9, adult: Secondary | ICD-10-CM | POA: Insufficient documentation

## 2014-01-03 DIAGNOSIS — K8010 Calculus of gallbladder with chronic cholecystitis without obstruction: Secondary | ICD-10-CM | POA: Insufficient documentation

## 2014-01-03 DIAGNOSIS — F341 Dysthymic disorder: Secondary | ICD-10-CM | POA: Insufficient documentation

## 2014-01-03 HISTORY — PX: CHOLECYSTECTOMY: SHX55

## 2014-01-03 SURGERY — LAPAROSCOPIC CHOLECYSTECTOMY
Anesthesia: General | Site: Abdomen

## 2014-01-03 MED ORDER — PROMETHAZINE HCL 25 MG PO TABS: 25.0000 mg | ORAL_TABLET | Freq: Four times a day (QID) | ORAL | Status: DC | PRN

## 2014-01-03 MED ORDER — SEVOFLURANE IN SOLN
RESPIRATORY_TRACT | Status: AC
  Filled 2014-01-03: qty 250

## 2014-01-03 MED ORDER — POVIDONE-IODINE 10 % EX OINT
TOPICAL_OINTMENT | CUTANEOUS | Status: AC
  Filled 2014-01-03: qty 1

## 2014-01-03 MED ORDER — METHYLPREDNISOLONE SODIUM SUCC 125 MG IJ SOLR
INTRAMUSCULAR | Status: AC
  Filled 2014-01-03: qty 2

## 2014-01-03 MED ORDER — PROPOFOL 10 MG/ML IV BOLUS
INTRAVENOUS | Status: DC | PRN
  Administered 2014-01-03: 150 mg via INTRAVENOUS

## 2014-01-03 MED ORDER — SUCCINYLCHOLINE CHLORIDE 20 MG/ML IJ SOLN
INTRAMUSCULAR | Status: DC | PRN
  Administered 2014-01-03: 120 mg via INTRAVENOUS

## 2014-01-03 MED ORDER — GLYCOPYRROLATE 0.2 MG/ML IJ SOLN
INTRAMUSCULAR | Status: DC | PRN
  Administered 2014-01-03: 0.6 mg via INTRAVENOUS

## 2014-01-03 MED ORDER — GLYCOPYRROLATE 0.2 MG/ML IJ SOLN
0.2000 mg | Freq: Once | INTRAMUSCULAR | Status: AC
  Administered 2014-01-03: 0.2 mg via INTRAVENOUS

## 2014-01-03 MED ORDER — ROCURONIUM BROMIDE 100 MG/10ML IV SOLN
INTRAVENOUS | Status: DC | PRN
  Administered 2014-01-03: 10 mg via INTRAVENOUS
  Administered 2014-01-03: 20 mg via INTRAVENOUS

## 2014-01-03 MED ORDER — ARTIFICIAL TEARS OP OINT
TOPICAL_OINTMENT | OPHTHALMIC | Status: DC | PRN
  Administered 2014-01-03: 1 via OPHTHALMIC

## 2014-01-03 MED ORDER — KETOROLAC TROMETHAMINE 30 MG/ML IJ SOLN
30.0000 mg | Freq: Once | INTRAMUSCULAR | Status: AC
  Administered 2014-01-03: 30 mg via INTRAVENOUS
  Filled 2014-01-03: qty 1

## 2014-01-03 MED ORDER — LIDOCAINE HCL (CARDIAC) 20 MG/ML IV SOLN
INTRAVENOUS | Status: DC | PRN
  Administered 2014-01-03: 50 mg via INTRAVENOUS

## 2014-01-03 MED ORDER — HYDROMORPHONE HCL 2 MG PO TABS: 2.0000 mg | ORAL_TABLET | ORAL | Status: DC | PRN

## 2014-01-03 MED ORDER — LACTATED RINGERS IV SOLN
INTRAVENOUS | Status: DC
  Administered 2014-01-03: 1000 mL via INTRAVENOUS

## 2014-01-03 MED ORDER — CIPROFLOXACIN IN D5W 400 MG/200ML IV SOLN
400.0000 mg | INTRAVENOUS | Status: AC
  Administered 2014-01-03: 400 mg via INTRAVENOUS
  Filled 2014-01-03: qty 200

## 2014-01-03 MED ORDER — MIDAZOLAM HCL 2 MG/2ML IJ SOLN
1.0000 mg | INTRAMUSCULAR | Status: DC | PRN
  Administered 2014-01-03: 2 mg via INTRAVENOUS

## 2014-01-03 MED ORDER — POVIDONE-IODINE 10 % OINT PACKET
TOPICAL_OINTMENT | CUTANEOUS | Status: DC | PRN
  Administered 2014-01-03: 1 via TOPICAL

## 2014-01-03 MED ORDER — SODIUM CHLORIDE 0.9 % IR SOLN
Status: DC | PRN
  Administered 2014-01-03: 500 mL

## 2014-01-03 MED ORDER — CHLORHEXIDINE GLUCONATE 4 % EX LIQD: 1.0000 "application " | Freq: Once | CUTANEOUS | Status: DC

## 2014-01-03 MED ORDER — FENTANYL CITRATE 0.05 MG/ML IJ SOLN
25.0000 ug | INTRAMUSCULAR | Status: DC | PRN
  Administered 2014-01-03: 50 ug via INTRAVENOUS

## 2014-01-03 MED ORDER — METHYLPREDNISOLONE SODIUM SUCC 125 MG IJ SOLR
60.0000 mg | Freq: Once | INTRAMUSCULAR | Status: AC
  Administered 2014-01-03: 60 mg via INTRAVENOUS

## 2014-01-03 MED ORDER — FENTANYL CITRATE 0.05 MG/ML IJ SOLN
INTRAMUSCULAR | Status: DC | PRN
  Administered 2014-01-03 (×7): 50 ug via INTRAVENOUS

## 2014-01-03 MED ORDER — LACTATED RINGERS IV SOLN
INTRAVENOUS | Status: DC | PRN
  Administered 2014-01-03 (×2): via INTRAVENOUS

## 2014-01-03 MED ORDER — FENTANYL CITRATE 0.05 MG/ML IJ SOLN
INTRAMUSCULAR | Status: AC
  Filled 2014-01-03: qty 2

## 2014-01-03 MED ORDER — MIDAZOLAM HCL 2 MG/2ML IJ SOLN
INTRAMUSCULAR | Status: AC
  Filled 2014-01-03: qty 2

## 2014-01-03 MED ORDER — FENTANYL CITRATE 0.05 MG/ML IJ SOLN
INTRAMUSCULAR | Status: AC
  Filled 2014-01-03: qty 5

## 2014-01-03 MED ORDER — BUPIVACAINE HCL (PF) 0.5 % IJ SOLN
INTRAMUSCULAR | Status: AC
  Filled 2014-01-03: qty 30

## 2014-01-03 MED ORDER — BUPIVACAINE HCL (PF) 0.5 % IJ SOLN
INTRAMUSCULAR | Status: DC | PRN
  Administered 2014-01-03: 10 mL

## 2014-01-03 MED ORDER — ONDANSETRON HCL 4 MG/2ML IJ SOLN
4.0000 mg | Freq: Once | INTRAMUSCULAR | Status: AC
  Administered 2014-01-03: 4 mg via INTRAVENOUS

## 2014-01-03 MED ORDER — GLYCOPYRROLATE 0.2 MG/ML IJ SOLN
INTRAMUSCULAR | Status: AC
  Filled 2014-01-03: qty 3

## 2014-01-03 MED ORDER — ONDANSETRON HCL 4 MG/2ML IJ SOLN
INTRAMUSCULAR | Status: AC
  Filled 2014-01-03: qty 2

## 2014-01-03 MED ORDER — HEMOSTATIC AGENTS (NO CHARGE) OPTIME
TOPICAL | Status: DC | PRN
  Administered 2014-01-03: 1 via TOPICAL

## 2014-01-03 MED ORDER — GLYCOPYRROLATE 0.2 MG/ML IJ SOLN
INTRAMUSCULAR | Status: AC
  Filled 2014-01-03: qty 1

## 2014-01-03 MED ORDER — ARTIFICIAL TEARS OP OINT
TOPICAL_OINTMENT | OPHTHALMIC | Status: AC
  Filled 2014-01-03: qty 3.5

## 2014-01-03 MED ORDER — NEOSTIGMINE METHYLSULFATE 10 MG/10ML IV SOLN
INTRAVENOUS | Status: DC | PRN
  Administered 2014-01-03: 4 mg via INTRAVENOUS

## 2014-01-03 MED ORDER — ONDANSETRON HCL 4 MG/2ML IJ SOLN: 4.0000 mg | Freq: Once | INTRAMUSCULAR | Status: DC | PRN

## 2014-01-03 SURGICAL SUPPLY — 43 items
APPLIER CLIP LAPSCP 10X32 DD (CLIP) ×3 IMPLANT
BAG HAMPER (MISCELLANEOUS) ×3 IMPLANT
BAG SPEC RTRVL LRG 6X4 10 (ENDOMECHANICALS) ×1
BLADE 11 SAFETY STRL DISP (BLADE) ×3 IMPLANT
CLOTH BEACON ORANGE TIMEOUT ST (SAFETY) ×3 IMPLANT
COVER LIGHT HANDLE STERIS (MISCELLANEOUS) ×6 IMPLANT
DECANTER SPIKE VIAL GLASS SM (MISCELLANEOUS) ×3 IMPLANT
DURAPREP 26ML APPLICATOR (WOUND CARE) ×3 IMPLANT
ELECT REM PT RETURN 9FT ADLT (ELECTROSURGICAL) ×3
ELECTRODE REM PT RTRN 9FT ADLT (ELECTROSURGICAL) ×1 IMPLANT
FILTER SMOKE EVAC LAPAROSHD (FILTER) ×3 IMPLANT
FORMALIN 10 PREFIL 120ML (MISCELLANEOUS) ×3 IMPLANT
GLOVE ECLIPSE 6.5 STRL STRAW (GLOVE) ×2 IMPLANT
GLOVE INDICATOR 7.0 STRL GRN (GLOVE) ×4 IMPLANT
GLOVE SS BIOGEL STRL SZ 6.5 (GLOVE) IMPLANT
GLOVE SUPERSENSE BIOGEL SZ 6.5 (GLOVE) ×2
GLOVE SURG SS PI 7.5 STRL IVOR (GLOVE) ×3 IMPLANT
GOWN STRL REUS W/TWL LRG LVL3 (GOWN DISPOSABLE) ×9 IMPLANT
HEMOSTAT SNOW SURGICEL 2X4 (HEMOSTASIS) ×3 IMPLANT
INST SET LAPROSCOPIC AP (KITS) ×3 IMPLANT
IV NS IRRIG 3000ML ARTHROMATIC (IV SOLUTION) IMPLANT
KIT ROOM TURNOVER APOR (KITS) ×3 IMPLANT
MANIFOLD NEPTUNE II (INSTRUMENTS) ×3 IMPLANT
NDL INSUFFLATION 14GA 120MM (NEEDLE) ×1 IMPLANT
NEEDLE INSUFFLATION 14GA 120MM (NEEDLE) ×3 IMPLANT
NS IRRIG 1000ML POUR BTL (IV SOLUTION) ×3 IMPLANT
PACK LAP CHOLE LZT030E (CUSTOM PROCEDURE TRAY) ×3 IMPLANT
PAD ARMBOARD 7.5X6 YLW CONV (MISCELLANEOUS) ×3 IMPLANT
POUCH SPECIMEN RETRIEVAL 10MM (ENDOMECHANICALS) ×3 IMPLANT
SET BASIN LINEN APH (SET/KITS/TRAYS/PACK) ×3 IMPLANT
SET TUBE IRRIG SUCTION NO TIP (IRRIGATION / IRRIGATOR) IMPLANT
SLEEVE ENDOPATH XCEL 5M (ENDOMECHANICALS) ×3 IMPLANT
SPONGE GAUZE 2X2 8PLY STER LF (GAUZE/BANDAGES/DRESSINGS) ×4
SPONGE GAUZE 2X2 8PLY STRL LF (GAUZE/BANDAGES/DRESSINGS) ×8 IMPLANT
STAPLER VISISTAT (STAPLE) ×3 IMPLANT
SUT VICRYL 0 UR6 27IN ABS (SUTURE) ×3 IMPLANT
TAPE CLOTH SURG 4X10 WHT LF (GAUZE/BANDAGES/DRESSINGS) ×2 IMPLANT
TROCAR ENDO BLADELESS 11MM (ENDOMECHANICALS) ×3 IMPLANT
TROCAR XCEL NON-BLD 5MMX100MML (ENDOMECHANICALS) ×3 IMPLANT
TROCAR XCEL UNIV SLVE 11M 100M (ENDOMECHANICALS) ×3 IMPLANT
TUBING INSUFFLATION (TUBING) ×3 IMPLANT
WARMER LAPAROSCOPE (MISCELLANEOUS) ×3 IMPLANT
YANKAUER SUCT 12FT TUBE ARGYLE (SUCTIONS) ×3 IMPLANT

## 2014-01-03 NOTE — Discharge Instructions (Signed)

## 2014-01-03 NOTE — Interval H&P Note (Signed)
History and Physical Interval Note:  01/03/2014 10:17 AM  Jillian Mcpherson  has presented today for surgery, with the diagnosis of cholelithiasis  The various methods of treatment have been discussed with the patient and family. After consideration of risks, benefits and other options for treatment, the patient has consented to  Procedure(s): LAPAROSCOPIC CHOLECYSTECTOMY (N/A) as a surgical intervention .  The patient's history has been reviewed, patient examined, no change in status, stable for surgery.  I have reviewed the patient's chart and labs.  Questions were answered to the patient's satisfaction.     Franky MachoJENKINS,Shirrell Solinger A

## 2014-01-03 NOTE — Anesthesia Procedure Notes (Signed)
Procedure Name: Intubation Date/Time: 01/03/2014 10:32 AM Performed by: Pernell DupreADAMS, Dametrius Sanjuan A Pre-anesthesia Checklist: Patient identified, Patient being monitored, Timeout performed, Emergency Drugs available and Suction available Patient Re-evaluated:Patient Re-evaluated prior to inductionOxygen Delivery Method: Circle System Utilized Preoxygenation: Pre-oxygenation with 100% oxygen Intubation Type: IV induction, Rapid sequence and Cricoid Pressure applied Laryngoscope Size: 3 and Miller Grade View: Grade I Tube type: Oral Tube size: 7.0 mm Number of attempts: 1 Airway Equipment and Method: stylet Placement Confirmation: ETT inserted through vocal cords under direct vision,  positive ETCO2 and breath sounds checked- equal and bilateral Secured at: 21 cm Tube secured with: Tape Dental Injury: Teeth and Oropharynx as per pre-operative assessment

## 2014-01-03 NOTE — Anesthesia Preprocedure Evaluation (Signed)
Anesthesia Evaluation  Patient identified by MRN, date of birth, ID band Patient awake    Reviewed: Allergy & Precautions, H&P , NPO status , Patient's Chart, lab work & pertinent test results  Airway Mallampati: I TM Distance: >3 FB     Dental  (+) Teeth Intact   Pulmonary former smoker,  breath sounds clear to auscultation        Cardiovascular negative cardio ROS  Rhythm:Regular Rate:Normal     Neuro/Psych PSYCHIATRIC DISORDERS Anxiety Depression    GI/Hepatic GERD-  Poorly Controlled,  Endo/Other  Morbid obesity  Renal/GU      Musculoskeletal   Abdominal   Peds  Hematology   Anesthesia Other Findings   Reproductive/Obstetrics                           Anesthesia Physical Anesthesia Plan  ASA: II  Anesthesia Plan: General   Post-op Pain Management:    Induction: Intravenous, Rapid sequence and Cricoid pressure planned  Airway Management Planned: Oral ETT  Additional Equipment:   Intra-op Plan:   Post-operative Plan: Extubation in OR  Informed Consent: I have reviewed the patients History and Physical, chart, labs and discussed the procedure including the risks, benefits and alternatives for the proposed anesthesia with the patient or authorized representative who has indicated his/her understanding and acceptance.     Plan Discussed with:   Anesthesia Plan Comments:         Anesthesia Quick Evaluation

## 2014-01-03 NOTE — Interval H&P Note (Signed)
History and Physical Interval Note:  01/03/2014 10:18 AM  Jillian Mcpherson  has presented today for surgery, with the diagnosis of cholelithiasis  The various methods of treatment have been discussed with the patient and family. After consideration of risks, benefits and other options for treatment, the patient has consented to  Procedure(s): LAPAROSCOPIC CHOLECYSTECTOMY (N/A) as a surgical intervention .  The patient's history has been reviewed, patient examined, no change in status, stable for surgery.  I have reviewed the patient's chart and labs.  Questions were answered to the patient's satisfaction.     Franky MachoJENKINS,Tasha Diaz A

## 2014-01-03 NOTE — Anesthesia Postprocedure Evaluation (Signed)
  Anesthesia Post-op Note  Patient: Jillian Mcpherson  Procedure(s) Performed: Procedure(s): LAPAROSCOPIC CHOLECYSTECTOMY (N/A)  Patient Location: PACU  Anesthesia Type:General  Level of Consciousness: sedated and patient cooperative  Airway and Oxygen Therapy: Patient Spontanous Breathing and Patient connected to face mask oxygen  Post-op Pain: mild  Post-op Assessment: Post-op Vital signs reviewed, Patient's Cardiovascular Status Stable, Respiratory Function Stable, Patent Airway, No signs of Nausea or vomiting and Pain level controlled  Post-op Vital Signs: Reviewed and stable  Last Vitals:  Filed Vitals:   01/03/14 1005  BP: 145/98  Temp:   Resp: 9    Complications: No apparent anesthesia complications

## 2014-01-03 NOTE — Transfer of Care (Signed)
Immediate Anesthesia Transfer of Care Note  Patient: Jillian Mcpherson  Procedure(s) Performed: Procedure(s): LAPAROSCOPIC CHOLECYSTECTOMY (N/A)  Patient Location: PACU  Anesthesia Type:General  Level of Consciousness: sedated and patient cooperative  Airway & Oxygen Therapy: Patient Spontanous Breathing and Patient connected to face mask oxygen  Post-op Assessment: Report given to PACU RN and Post -op Vital signs reviewed and stable  Post vital signs: Reviewed and stable  Complications: No apparent anesthesia complications

## 2014-01-03 NOTE — Op Note (Signed)
Patient:  Jillian Mcpherson  DOB:  07/30/1990  MRN:  [id number]   Preop Diagnosis:  Cholecystitis, cholelithiasis  Postop Diagnosis:  Same  Procedure:  Laparoscopic cholecystectomy  Surgeon:  Franky MachoMark Daymian Lill, M.D.  Anes:  General endotracheal  Indications:  Patient is a 23 year old white female presents with cholecystitis secondary to cholelithiasis. The risks and benefits of the procedure including bleeding, infection, hepatobiliary injury, and the possibility of an open procedure were fully explained to the patient, who gave informed consent.  Procedure note:  The patient is placed the supine position. After induction of general endotracheal anesthesia, the abdomen was prepped and draped using usual sterile technique with DuraPrep. Surgical site confirmation was performed.  A supraumbilical incision was made down to the fascia. A Veress needle was introduced into the abdominal cavity and confirmation of placement was done using the saline drop test. The abdomen was then insufflated to 16 mm mercury pressure. An 11 mm trocar was introduced into the abdominal cavity under direct visualization without difficulty. The patient was placed in reverse Trendelenburg position and additional 11 mm trocar was placed in the epigastric region 5 mm trochars were placed the right upper quadrant and right flank regions. Liver was inspected and noted to have normal limits. The gallbladder was noted be tense with a thickened, edematous gallbladder wall. An incision was made into the gallbladder lumen and hydrops of the gallbladder was found. This was evacuated using suction without difficulty. The gallbladder was then retracted in a dynamic fashion in order to expose the triangle of Calot. The cystic duct was first identified. Its junction to the infundibulum was fully identified. Endoclips were placed proximally and distally on the cystic duct, and the cystic duct was divided. This was likewise done cystic artery. The  gallbladder was then freed away from the gallbladder fossa using Bovie electrocautery. The gallbladder was delivered through the epigastric trocar site using an Endo Catch bag. The gallbladder fossa was inspected and no abnormal bleeding or bile leakage was noted. Surgicel is placed the gallbladder fossa. All fluid and air were then evacuated from the abdominal cavity prior to removal of the trochars.  All wounds were irrigated with normal saline. All wounds were injected with 0.5% Sensorcaine. The supraumbilical fascia as well as epigastric fascia reapproximated using 0 Vicryl interrupted sutures. All skin incisions were closed using staples. Betadine ointment and dressed a dressings were applied.  All tape and needle counts were correct at the end of the procedure. Patient was extubated in the operating room and transferred to PACU in stable condition.  Complications:  None  EBL:  Minimal  Specimen:  Gallbladder

## 2014-01-04 ENCOUNTER — Telehealth (INDEPENDENT_AMBULATORY_CARE_PROVIDER_SITE_OTHER): Payer: Self-pay | Admitting: General Surgery

## 2014-01-04 NOTE — Telephone Encounter (Signed)
Patient called in complaining that oral pain medication was not controlling her abdominal pain. She states she had gallbladder surgery yesterday. She denies any fever, chills, nausea or vomiting. She states that she was tolerating liquids and food. She states she was taking 2 oral narcotics every 4 hours. Because of lack of additional symptoms, I recommended adding Motrin as well as Tylenol to her regimen. Advised her that if these additional medications did not work the only other option was to go to the emergency room for evaluation. Advised her if she developed additional symptoms to call back or to return to the emergency room.patient voiced understanding

## 2014-01-05 ENCOUNTER — Encounter (HOSPITAL_COMMUNITY): Payer: Self-pay | Admitting: General Surgery

## 2014-01-13 ENCOUNTER — Emergency Department (HOSPITAL_COMMUNITY): Payer: Medicaid Other

## 2014-01-13 ENCOUNTER — Emergency Department (HOSPITAL_COMMUNITY)
Admission: EM | Admit: 2014-01-13 | Discharge: 2014-01-13 | Disposition: A | Discharge status: Home / Self Care | Payer: Medicaid Other | Attending: Emergency Medicine | Admitting: Emergency Medicine

## 2014-01-13 ENCOUNTER — Encounter (HOSPITAL_COMMUNITY): Payer: Self-pay | Admitting: Emergency Medicine

## 2014-01-13 DIAGNOSIS — Y92410 Unspecified street and highway as the place of occurrence of the external cause: Secondary | ICD-10-CM | POA: Insufficient documentation

## 2014-01-13 DIAGNOSIS — S335XXA Sprain of ligaments of lumbar spine, initial encounter: Secondary | ICD-10-CM | POA: Insufficient documentation

## 2014-01-13 DIAGNOSIS — Z3202 Encounter for pregnancy test, result negative: Secondary | ICD-10-CM | POA: Insufficient documentation

## 2014-01-13 DIAGNOSIS — F3289 Other specified depressive episodes: Secondary | ICD-10-CM | POA: Diagnosis not present

## 2014-01-13 DIAGNOSIS — F329 Major depressive disorder, single episode, unspecified: Secondary | ICD-10-CM | POA: Diagnosis not present

## 2014-01-13 DIAGNOSIS — IMO0002 Reserved for concepts with insufficient information to code with codable children: Secondary | ICD-10-CM | POA: Insufficient documentation

## 2014-01-13 DIAGNOSIS — F411 Generalized anxiety disorder: Secondary | ICD-10-CM | POA: Diagnosis not present

## 2014-01-13 DIAGNOSIS — Y9389 Activity, other specified: Secondary | ICD-10-CM | POA: Diagnosis not present

## 2014-01-13 DIAGNOSIS — S39012A Strain of muscle, fascia and tendon of lower back, initial encounter: Secondary | ICD-10-CM

## 2014-01-13 LAB — POC URINE PREG, ED: PREG TEST UR: NEGATIVE

## 2014-01-13 MED ORDER — OXYCODONE-ACETAMINOPHEN 5-325 MG PO TABS
1.0000 | ORAL_TABLET | Freq: Once | ORAL | Status: AC
  Administered 2014-01-13: 1 via ORAL
  Filled 2014-01-13: qty 1

## 2014-01-13 NOTE — ED Notes (Signed)
Pt on bedpan.

## 2014-01-13 NOTE — ED Notes (Signed)
Pt on back board and hard collar in place

## 2014-01-13 NOTE — Discharge Instructions (Signed)
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
## 2014-01-25 ENCOUNTER — Encounter: Payer: Medicaid Other | Admitting: Obstetrics & Gynecology

## 2014-02-16 NOTE — Progress Notes (Signed)
This encounter was created in error - please disregard.

## 2014-03-21 ENCOUNTER — Encounter (HOSPITAL_COMMUNITY): Payer: Self-pay | Admitting: Emergency Medicine

## 2014-08-23 IMAGING — US US ABDOMEN LIMITED
1 series · 14 of 25 positions shown · non-contrast
Comparison: Prior abdominal ultrasound 02/13/2013

CLINICAL DATA: Right upper quadrant pain, nausea

EXAM:
US ABDOMEN LIMITED - RIGHT UPPER QUADRANT

[Series 1: us abdomen limited · 0.26mm/px · 14 of 53 slices shown]
[im 1/53]
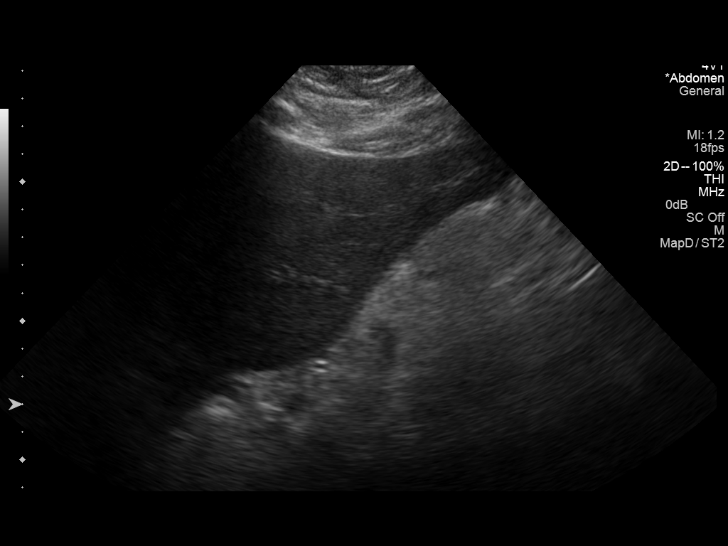
[im 5/53]
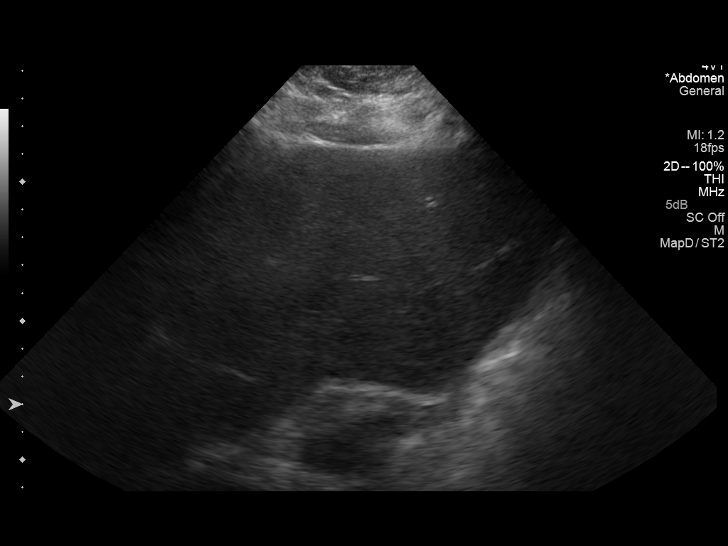
[im 9/53]
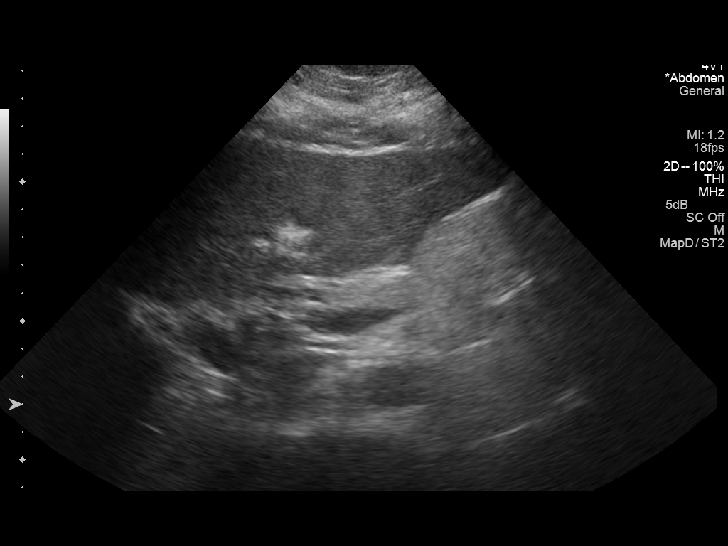
[im 14/53]
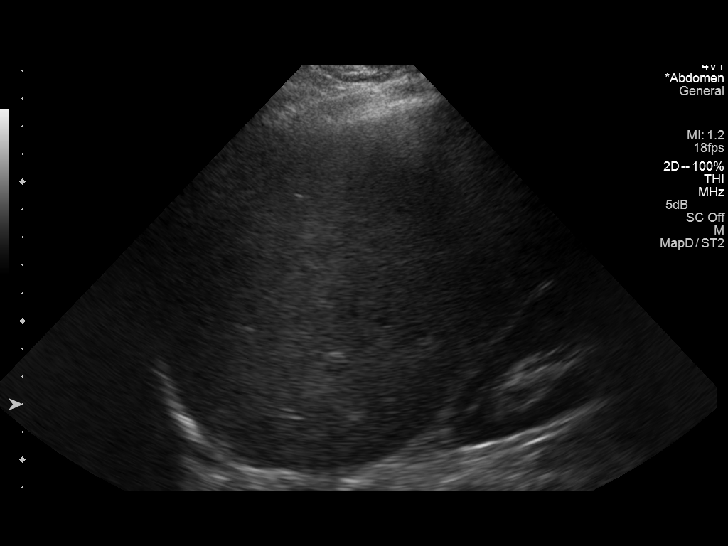
[im 18/53]
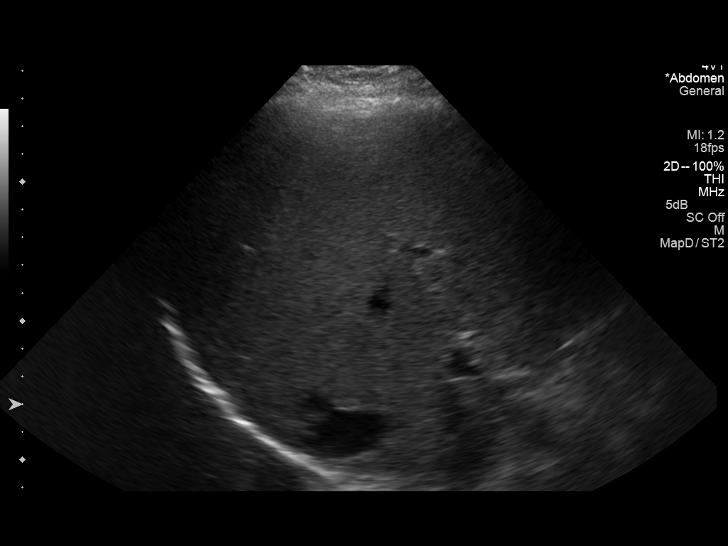
[im 20/53]
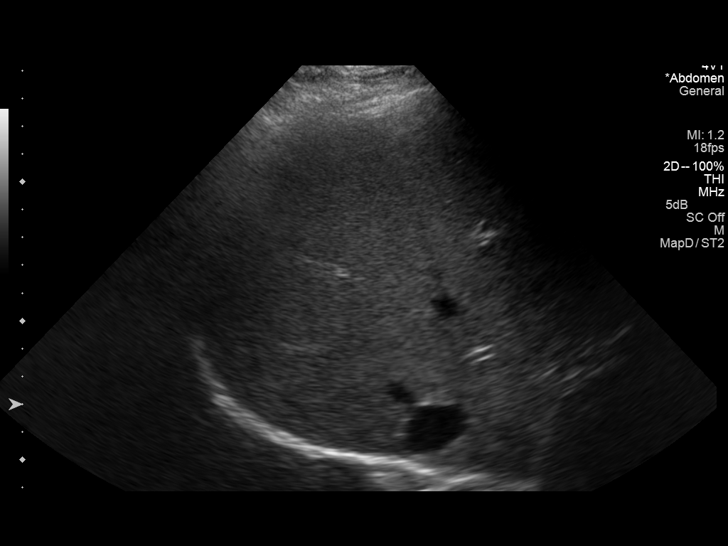
[im 24/53]
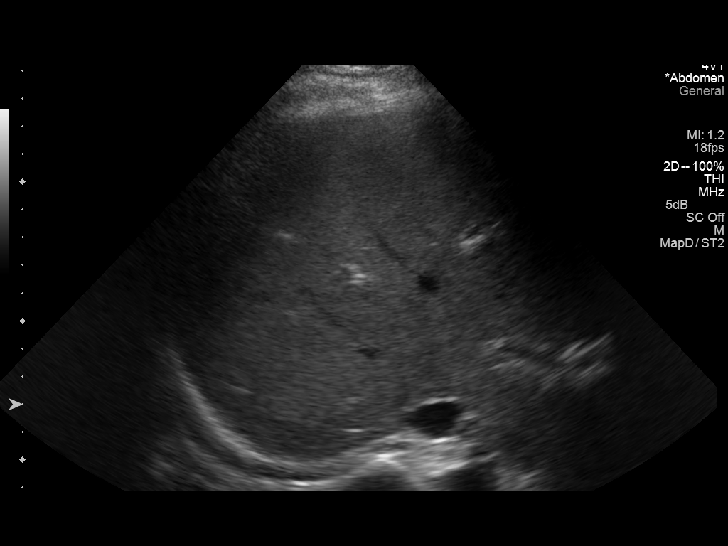
[im 29/53]
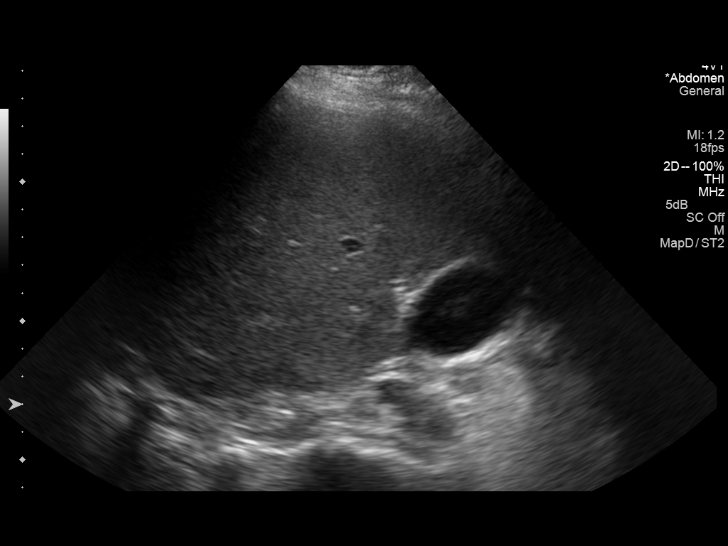
[im 33/53]
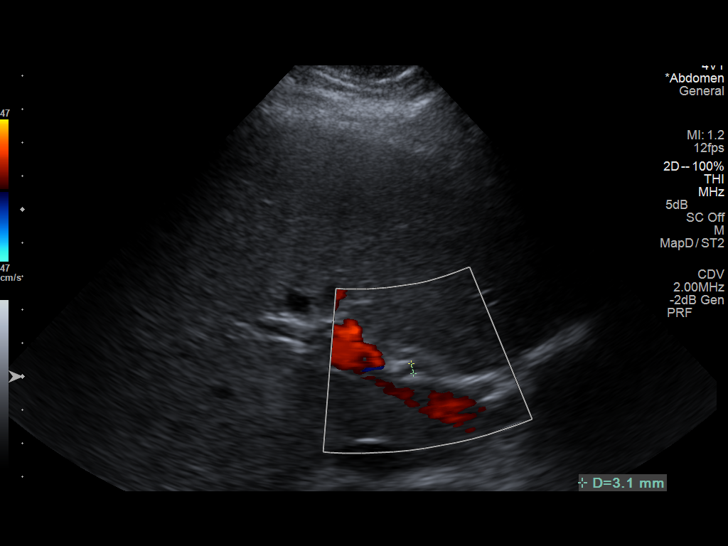
[im 35/53]
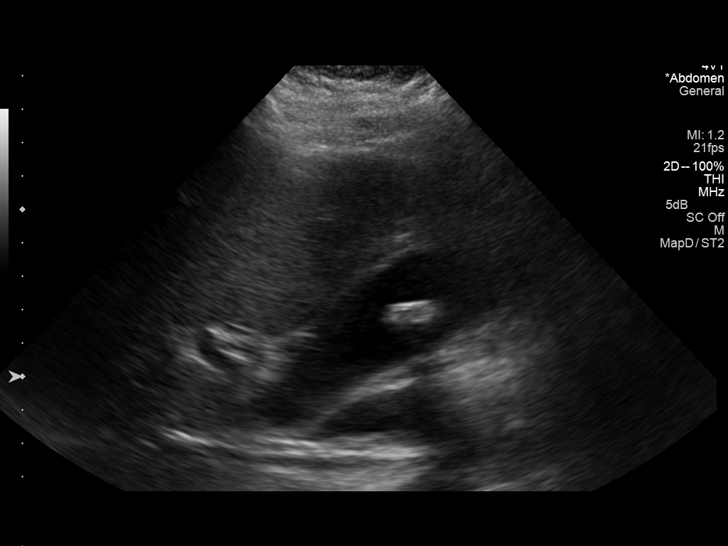
[im 40/53]
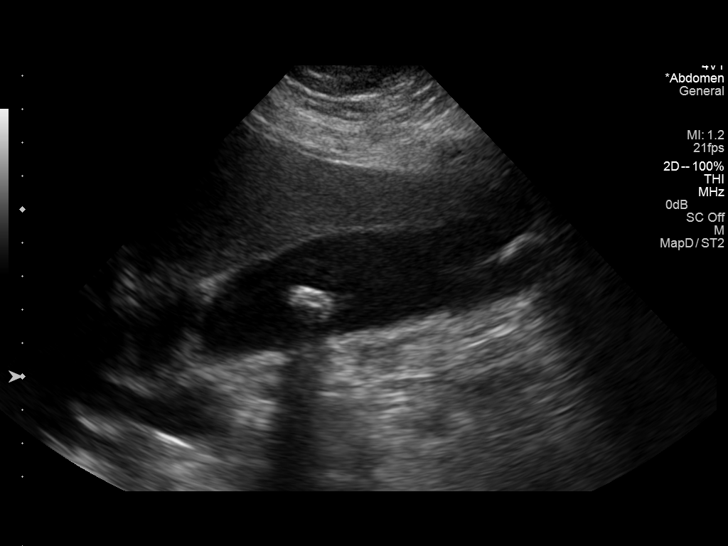
[im 44/53]
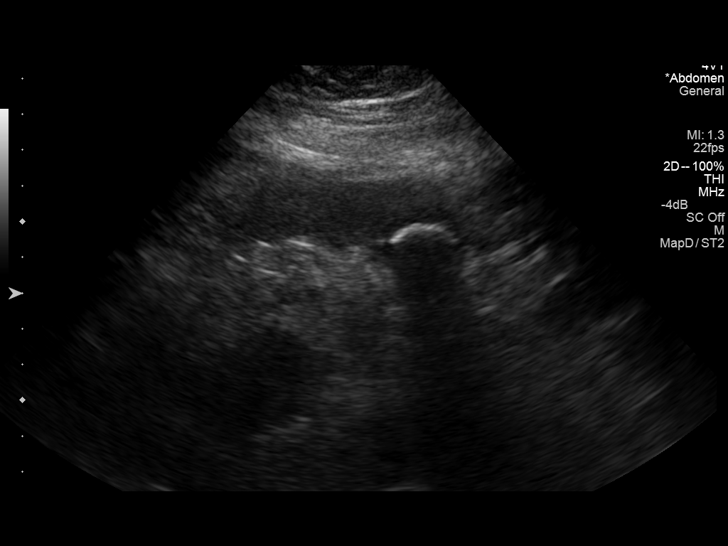
[im 48/53]
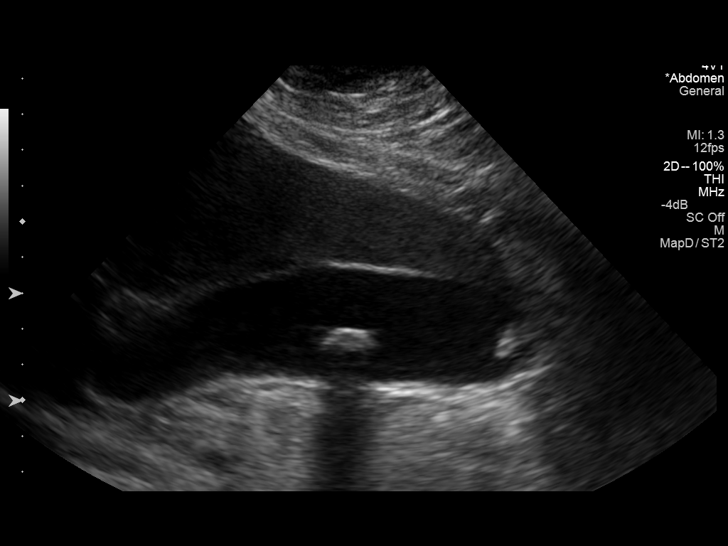
[im 53/53]
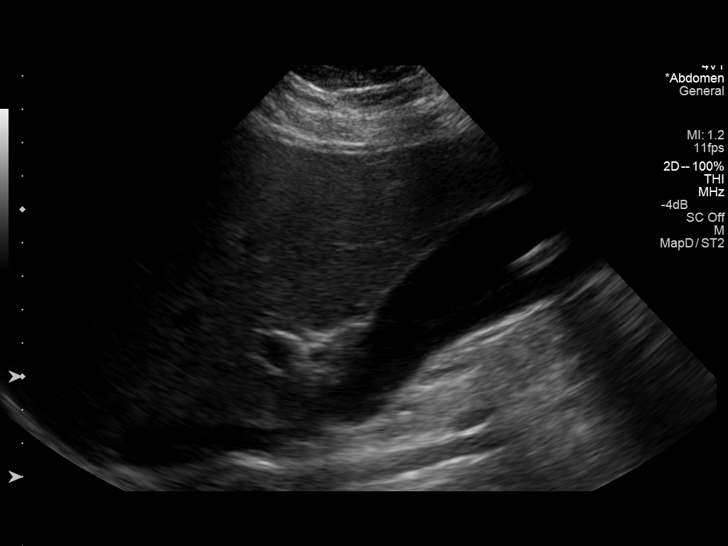

[14 of 25 positions shown; findings below may reference images not displayed]

FINDINGS: Gallbladder:

Mobile echogenic shadowing foci within the gallbladder lumen
consistent with cholelithiasis. The gallbladder wall is within
normal limits at 3 mm. No pericholecystic fluid. Per the
sonographer, the sonographic Murphy sign was negative.

Common bile duct:

Diameter: Within normal limits at 3.1 mm

Liver:

No focal lesion identified. Within normal limits in parenchymal
echogenicity. The main portal vein is patent with normal hepatopetal
flow.
IMPRESSION: Cholelithiasis without secondary sonographic findings to suggest
acute cholecystitis.
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
## 2015-12-14 ENCOUNTER — Encounter (HOSPITAL_COMMUNITY): Payer: Self-pay

## 2015-12-14 ENCOUNTER — Emergency Department (HOSPITAL_COMMUNITY)
Admission: EM | Admit: 2015-12-14 | Discharge: 2015-12-15 | Disposition: A | Discharge status: Home / Self Care | Payer: Medicaid Other | Attending: Emergency Medicine | Admitting: Emergency Medicine

## 2015-12-14 DIAGNOSIS — K029 Dental caries, unspecified: Secondary | ICD-10-CM

## 2015-12-14 DIAGNOSIS — Z87891 Personal history of nicotine dependence: Secondary | ICD-10-CM | POA: Insufficient documentation

## 2015-12-14 DIAGNOSIS — I10 Essential (primary) hypertension: Secondary | ICD-10-CM | POA: Insufficient documentation

## 2015-12-14 HISTORY — DX: Essential (primary) hypertension: I10

## 2015-12-14 NOTE — ED Triage Notes (Signed)
Complains of left lower dental pain x6 days. Has tried OTC meds with no relief.

## 2015-12-15 MED ORDER — AMOXICILLIN 500 MG PO CAPS: 500.0000 mg | ORAL_CAPSULE | Freq: Three times a day (TID) | ORAL | 0 refills | Status: DC

## 2015-12-15 MED ORDER — ONDANSETRON HCL 4 MG PO TABS
4.0000 mg | ORAL_TABLET | Freq: Once | ORAL | Status: AC
  Administered 2015-12-15: 4 mg via ORAL
  Filled 2015-12-15: qty 1

## 2015-12-15 MED ORDER — IBUPROFEN 800 MG PO TABS
800.0000 mg | ORAL_TABLET | Freq: Once | ORAL | Status: AC
  Administered 2015-12-15: 800 mg via ORAL
  Filled 2015-12-15: qty 1

## 2015-12-15 MED ORDER — IBUPROFEN 600 MG PO TABS: 600.0000 mg | ORAL_TABLET | Freq: Four times a day (QID) | ORAL | 0 refills | Status: DC | PRN

## 2015-12-15 MED ORDER — TRAMADOL HCL 50 MG PO TABS: 50.0000 mg | ORAL_TABLET | Freq: Four times a day (QID) | ORAL | 0 refills | Status: DC | PRN

## 2015-12-15 MED ORDER — CLINDAMYCIN HCL 150 MG PO CAPS
300.0000 mg | ORAL_CAPSULE | Freq: Once | ORAL | Status: AC
  Administered 2015-12-15: 300 mg via ORAL
  Filled 2015-12-15: qty 2

## 2015-12-15 MED ORDER — TRAMADOL HCL 50 MG PO TABS
100.0000 mg | ORAL_TABLET | Freq: Once | ORAL | Status: AC
  Administered 2015-12-15: 100 mg via ORAL
  Filled 2015-12-15: qty 2

## 2015-12-15 NOTE — ED Provider Notes (Signed)
[facility] Provider Note   CSN: [id number] Arrival date & time: 12/14/15  2223  First Provider Contact:  First MD Initiated Contact with Patient 12/15/15 0019        History   Chief Complaint Chief Complaint  Patient presents with  . Dental Pain    HPI Jillian Mcpherson is a 25 y.o. female.  The history is provided by the patient.  Dental Pain   This is a new problem. The current episode started more than 1 week ago. The problem occurs daily. The problem has been gradually worsening. The pain is at a severity of 8/10. The pain is moderate. She has tried acetaminophen (oragel and senodyne) for the symptoms. The treatment provided no relief.    Past Medical History:  Diagnosis Date  . Anxiety   . Depression   . Hypertension     Patient Active Problem List   Diagnosis Date Noted  . S/P C-section 11/18/2013  . Breech birth 11/15/2013  . Breech presentation 11/01/2013  . Supervision of normal first pregnancy 08/09/2013  . Late prenatal care complicating pregnancy 08/09/2013    Past Surgical History:  Procedure Laterality Date  . CESAREAN SECTION N/A 11/18/2013   Procedure: CESAREAN SECTION;  Surgeon: Lazaro Arms, MD;  Location: [facility];  Service: Obstetrics;  Laterality: N/A;  . CHOLECYSTECTOMY N/A 01/03/2014   Procedure: LAPAROSCOPIC CHOLECYSTECTOMY;  Surgeon: Dalia Heading, MD;  Location: [facility];  Service: General;  Laterality: N/A;  . TONSILLECTOMY      OB History    Gravida Para Term Preterm AB Living   1 1 1     1    SAB TAB Ectopic Multiple Live Births                   Home Medications    Prior to Admission medications   Medication Sig Start Date End Date Taking? Authorizing Provider  escitalopram (LEXAPRO) 10 MG tablet Take 1 tablet (10 mg total) by mouth daily. 12/28/13   Cheral Marker, CNM  HYDROmorphone (DILAUDID) 2 MG tablet Take 1-2 tablets (2-4 mg total) by mouth every 4 (four) hours as needed for severe pain. 01/03/14   Franky Macho, MD    ibuprofen (ADVIL,MOTRIN) 200 MG tablet Take 1,000 mg by mouth every 8 (eight) hours as needed for moderate pain.     Historical Provider, MD  predniSONE (DELTASONE) 20 MG tablet Take 2 tablets (40 mg total) by mouth daily with breakfast. X 10 days 12/28/13   Cheral Marker, CNM  promethazine (PHENERGAN) 25 MG tablet Take 1 tablet (25 mg total) by mouth every 6 (six) hours as needed for nausea or vomiting. 01/03/14   Franky Macho, MD  ranitidine (ZANTAC) 150 MG tablet Take 1 tablet (150 mg total) by mouth 2 (two) times daily. 12/23/13   Hope Orlene Och, NP    Family History Family History  Problem Relation Age of Onset  . Hypertension Mother   . Heart disease Mother   . Hypertension Father   . Asthma Father   . Heart disease Maternal Grandmother   . COPD Paternal Grandmother   . Cancer Other     great grandmother    Social History Social History  Substance Use Topics  . Smoking status: Former Smoker    Packs/day: 1.00    Years: 3.00    Types: Cigarettes    Quit date: 01/01/2011  . Smokeless tobacco: Never Used  . Alcohol use No  Allergies   Review of patient's allergies indicates no known allergies.   Review of Systems Review of Systems  HENT: Positive for dental problem.   Psychiatric/Behavioral: The patient is nervous/anxious.   All other systems reviewed and are negative.    Physical Exam Updated Vital Signs BP (!) 150/108   Pulse 77   Temp 98.5 F (36.9 C) (Oral)   Resp 20   Ht  (1.626 m)   Wt 117.9 kg   LMP  (LMP Unknown) Comment: Has Mirena  SpO2 100%   BMI 44.63 kg/m   Physical Exam  Constitutional: She is oriented to person, place, and time. She appears well-developed and well-nourished.  Non-toxic appearance.  HENT:  Head: Normocephalic.  Right Ear: Tympanic membrane and external ear normal.  Left Ear: Tympanic membrane and external ear normal.  Deep cavity of the left lower posterior molar. Swelling about the gum without visible abscess.  Airway patent. No swelling under the tongue.  Eyes: EOM and lids are normal. Pupils are equal, round, and reactive to light.  Neck: Normal range of motion. Neck supple. Carotid bruit is not present.  Cardiovascular: Normal rate, regular rhythm, normal heart sounds, intact distal pulses and normal pulses.   Pulmonary/Chest: Breath sounds normal. No respiratory distress.  Abdominal: Soft. Bowel sounds are normal. There is no tenderness. There is no guarding.  Musculoskeletal: Normal range of motion.  Lymphadenopathy:       Head (right side): No submandibular adenopathy present.       Head (left side): No submandibular adenopathy present.    She has no cervical adenopathy.  Neurological: She is alert and oriented to person, place, and time. She has normal strength. No cranial nerve deficit or sensory deficit.  Skin: Skin is warm and dry.  Psychiatric: She has a normal mood and affect. Her speech is normal.  Nursing note and vitals reviewed.    ED Treatments / Results  Labs (all labs ordered are listed, but only abnormal results are displayed) Labs Reviewed - No data to display  EKG  EKG Interpretation None       Radiology No results found.  Procedures Procedures (including critical care time)  Medications Ordered in ED Medications - No data to display   Initial Impression / Assessment and Plan / ED Course  I have reviewed the triage vital signs and the nursing notes.  Pertinent labs & imaging results that were available during my care of the patient were reviewed by me and considered in my medical decision making (see chart for details).  Clinical Course    **I have reviewed nursing notes, vital signs, and all appropriate lab and imaging results for this patient.*  Final Clinical Impressions(s) / ED Diagnoses  Vital signs reviewed. Patient has a deep cavity of the left lower posterior molar. There is no evidence for Ludwig's angina, or other emergent changes or  conditions. I strongly suggested that the patient see a dentist as soon as possible. Prescription for Amoxil, ibuprofen, and Ultram given to the patient. Patient acknowledges understanding of the discharge instructions.    Final diagnoses:  None    New Prescriptions New Prescriptions   No medications on file     Ivery Quale, PA-C 12/15/15 0036    Derwood Kaplan, MD 12/15/15 [phone]

## 2015-12-15 NOTE — ED Notes (Signed)
MD at bedside. 

## 2015-12-15 NOTE — Discharge Instructions (Signed)
It is important that you see a dentist as soon as possible. °

## 2016-01-07 ENCOUNTER — Emergency Department (HOSPITAL_COMMUNITY)
Admission: EM | Admit: 2016-01-07 | Discharge: 2016-01-07 | Disposition: A | Discharge status: Home / Self Care | Payer: Medicaid Other | Attending: Emergency Medicine | Admitting: Emergency Medicine

## 2016-01-07 ENCOUNTER — Encounter (HOSPITAL_COMMUNITY): Payer: Self-pay | Admitting: Emergency Medicine

## 2016-01-07 DIAGNOSIS — R197 Diarrhea, unspecified: Secondary | ICD-10-CM | POA: Insufficient documentation

## 2016-01-07 DIAGNOSIS — R112 Nausea with vomiting, unspecified: Secondary | ICD-10-CM

## 2016-01-07 DIAGNOSIS — I10 Essential (primary) hypertension: Secondary | ICD-10-CM | POA: Insufficient documentation

## 2016-01-07 DIAGNOSIS — Z79899 Other long term (current) drug therapy: Secondary | ICD-10-CM | POA: Insufficient documentation

## 2016-01-07 DIAGNOSIS — R509 Fever, unspecified: Secondary | ICD-10-CM | POA: Insufficient documentation

## 2016-01-07 DIAGNOSIS — Z87891 Personal history of nicotine dependence: Secondary | ICD-10-CM | POA: Insufficient documentation

## 2016-01-07 LAB — DIFFERENTIAL
Basophils Absolute: 0 10*3/uL (ref 0.0–0.1)
Basophils Relative: 0 %
EOS PCT: 0 %
Eosinophils Absolute: 0.1 10*3/uL (ref 0.0–0.7)
LYMPHS ABS: 2 10*3/uL (ref 0.7–4.0)
LYMPHS PCT: 7 %
MONO ABS: 1.9 10*3/uL — AB (ref 0.1–1.0)
MONOS PCT: 7 %
NEUTROS ABS: 24 10*3/uL — AB (ref 1.7–7.7)
Neutrophils Relative %: 86 %

## 2016-01-07 LAB — COMPREHENSIVE METABOLIC PANEL
ALBUMIN: 4.1 g/dL (ref 3.5–5.0)
ALK PHOS: 72 U/L (ref 38–126)
ALT: 18 U/L (ref 14–54)
ANION GAP: 4 — AB (ref 5–15)
AST: 16 U/L (ref 15–41)
BILIRUBIN TOTAL: 0.5 mg/dL (ref 0.3–1.2)
BUN: 11 mg/dL (ref 6–20)
CALCIUM: 8.6 mg/dL — AB (ref 8.9–10.3)
CHLORIDE: 108 mmol/L (ref 101–111)
CO2: 24 mmol/L (ref 22–32)
Creatinine, Ser: 0.7 mg/dL (ref 0.44–1.00)
GFR calc Af Amer: >60 mL/min (ref >60)
GLUCOSE: 110 mg/dL — AB (ref 65–99)
Potassium: 4.2 mmol/L (ref 3.5–5.1)
Sodium: 136 mmol/L (ref 135–145)
Total Protein: 7.7 g/dL (ref 6.5–8.1)

## 2016-01-07 LAB — CBC
HCT: 41.9 % (ref 36.0–46.0)
HEMOGLOBIN: 13.9 g/dL (ref 12.0–15.0)
MCH: 26.8 pg (ref 26.0–34.0)
MCHC: 33.2 g/dL (ref 30.0–36.0)
MCV: 80.9 fL (ref 78.0–100.0)
Platelets: 415 10*3/uL — ABNORMAL HIGH (ref 150–400)
RBC: 5.18 MIL/uL — ABNORMAL HIGH (ref 3.87–5.11)
RDW: 13.5 % (ref 11.5–15.5)
WBC: 28.6 10*3/uL — ABNORMAL HIGH (ref 4.0–10.5)

## 2016-01-07 LAB — URINALYSIS, ROUTINE W REFLEX MICROSCOPIC
BILIRUBIN URINE: NEGATIVE
GLUCOSE, UA: NEGATIVE mg/dL
Ketones, ur: NEGATIVE mg/dL
Leukocytes, UA: NEGATIVE
Nitrite: NEGATIVE
PH: 6 (ref 5.0–8.0)
Protein, ur: NEGATIVE mg/dL
SPECIFIC GRAVITY, URINE: 1.015 (ref 1.005–1.030)

## 2016-01-07 LAB — URINE MICROSCOPIC-ADD ON

## 2016-01-07 LAB — LIPASE, BLOOD: Lipase: 14 U/L (ref 11–51)

## 2016-01-07 LAB — PREGNANCY, URINE: PREG TEST UR: NEGATIVE

## 2016-01-07 MED ORDER — SODIUM CHLORIDE 0.9 % IV BOLUS (SEPSIS)
2000.0000 mL | Freq: Once | INTRAVENOUS | Status: AC
  Administered 2016-01-07: 2000 mL via INTRAVENOUS

## 2016-01-07 MED ORDER — ONDANSETRON HCL 4 MG/2ML IJ SOLN
4.0000 mg | Freq: Once | INTRAMUSCULAR | Status: AC
  Administered 2016-01-07: 4 mg via INTRAVENOUS
  Filled 2016-01-07: qty 2

## 2016-01-07 MED ORDER — ONDANSETRON 4 MG PO TBDP: 4.0000 mg | ORAL_TABLET | Freq: Three times a day (TID) | ORAL | 0 refills | Status: DC | PRN

## 2016-01-07 NOTE — ED Provider Notes (Signed)
[facility] Provider Note   CSN: [id number] Arrival date & time: 01/07/16  1752     History   Chief Complaint Chief Complaint  Patient presents with  . Emesis  . Diarrhea    HPI Jillian Mcpherson is a 25 y.o. female presenting with vomiting and diarrhea. Patient states that around 4:30 AM she had [employer]. She states the burger tasted okay but when she woke up around 2:30 PM she started having nausea, vomiting, and diarrhea. Has had about 5 episodes of loose, watery stools. 2 episodes of vomiting. No blood in either. Also uncomfortable in her upper abdomen but no pain. Currently no symptoms in her abdomen but nausea. Has felt hot and intermittent chills but has not taken a temperature to see if she had a fever. No other sick contacts.  HPI  Past Medical History:  Diagnosis Date  . Anxiety   . Depression   . Hypertension     Patient Active Problem List   Diagnosis Date Noted  . S/P C-section 11/18/2013  . Breech birth 11/15/2013  . Breech presentation 11/01/2013  . Supervision of normal first pregnancy 08/09/2013  . Late prenatal care complicating pregnancy 08/09/2013    Past Surgical History:  Procedure Laterality Date  . CESAREAN SECTION N/A 11/18/2013   Procedure: CESAREAN SECTION;  Surgeon: Lazaro ArmsLuther H Eure, MD;  Location: [facility];  Service: Obstetrics;  Laterality: N/A;  . CHOLECYSTECTOMY N/A 01/03/2014   Procedure: LAPAROSCOPIC CHOLECYSTECTOMY;  Surgeon: Dalia HeadingMark A Jenkins, MD;  Location: [facility];  Service: General;  Laterality: N/A;  . TONSILLECTOMY      OB History    Gravida Para Term Preterm AB Living   1 1 1     1    SAB TAB Ectopic Multiple Live Births           1       Home Medications    Prior to Admission medications   Medication Sig Start Date End Date Taking? Authorizing Provider  METOPROLOL TARTRATE PO Take 1 tablet by mouth daily.   Yes Historical Provider, MD  amoxicillin (AMOXIL) 500 MG capsule Take 1 capsule (500 mg total) by mouth 3 (three)  times daily. Patient not taking: Reported on 01/07/2016 12/15/15   Ivery QualeHobson Bryant, PA-C  ondansetron (ZOFRAN ODT) 4 MG disintegrating tablet Take 1 tablet (4 mg total) by mouth every 8 (eight) hours as needed for nausea or vomiting. 01/07/16   Pricilla LovelessScott Oliver Neuwirth, MD    Family History Family History  Problem Relation Age of Onset  . Hypertension Mother   . Heart disease Mother   . Hypertension Father   . Asthma Father   . Heart disease Maternal Grandmother   . COPD Paternal Grandmother   . Cancer Other     great grandmother    Social History Social History  Substance Use Topics  . Smoking status: Former Smoker    Packs/day: 1.00    Years: 3.00    Types: Cigarettes    Quit date: 01/01/2011  . Smokeless tobacco: Never Used  . Alcohol use No     Allergies   Review of patient's allergies indicates no known allergies.   Review of Systems Review of Systems  Constitutional: Positive for chills and fever.  Gastrointestinal: Positive for diarrhea, nausea and vomiting. Negative for abdominal pain and blood in stool.  Genitourinary: Negative for dysuria and hematuria.  Musculoskeletal: Negative for back pain.  All other systems reviewed and are negative.    Physical Exam Updated  Vital Signs BP 117/80 (BP Location: Right Arm)   Pulse 87   Temp 98.8 F (37.1 C) (Oral)   Resp 18   Ht 5\' 4"  (1.626 m)   Wt 260 lb (117.9 kg)   LMP  (LMP Unknown) Comment: Has Mirena  SpO2 100%   BMI 44.63 kg/m   Physical Exam  Constitutional: She is oriented to person, place, and time. She appears well-developed and well-nourished. No distress.  HENT:  Head: Normocephalic and atraumatic.  Right Ear: External ear normal.  Left Ear: External ear normal.  Nose: Nose normal.  Mouth/Throat: Mucous membranes are dry.  Eyes: Right eye exhibits no discharge. Left eye exhibits no discharge.  Cardiovascular: Regular rhythm and normal heart sounds.  Tachycardia present.   Pulmonary/Chest: Effort normal  and breath sounds normal.  Abdominal: Soft. There is no tenderness.  Neurological: She is alert and oriented to person, place, and time.  Skin: Skin is warm and dry. She is not diaphoretic.  Nursing note and vitals reviewed.    ED Treatments / Results  Labs (all labs ordered are listed, but only abnormal results are displayed) Labs Reviewed  COMPREHENSIVE METABOLIC PANEL - Abnormal; Notable for the following:       Result Value   Glucose, Bld 110 (*)    Calcium 8.6 (*)    Anion gap 4 (*)    All other components within normal limits  CBC - Abnormal; Notable for the following:    WBC 28.6 (*)    RBC 5.18 (*)    Platelets 415 (*)    All other components within normal limits  DIFFERENTIAL - Abnormal; Notable for the following:    Neutro Abs 24.0 (*)    Monocytes Absolute 1.9 (*)    All other components within normal limits  URINALYSIS, ROUTINE W REFLEX MICROSCOPIC (NOT AT [facility]) - Abnormal; Notable for the following:    Hgb urine dipstick TRACE (*)    All other components within normal limits  URINE MICROSCOPIC-ADD ON - Abnormal; Notable for the following:    Squamous Epithelial / LPF 0-5 (*)    Bacteria, UA MANY (*)    All other components within normal limits  LIPASE, BLOOD  PREGNANCY, URINE    EKG  EKG Interpretation None       Radiology No results found.  Procedures Procedures (including critical care time)  Medications Ordered in ED Medications  ondansetron (ZOFRAN) injection 4 mg (4 mg Intravenous Given 01/07/16 1918)  sodium chloride 0.9 % bolus 2,000 mL (2,000 mLs Intravenous New Bag/Given 01/07/16 1918)     Initial Impression / Assessment and Plan / ED Course  I have reviewed the triage vital signs and the nursing notes.  Pertinent labs & imaging results that were available during my care of the patient were reviewed by me and considered in my medical decision making (see chart for details).  Clinical Course  Comment By Time  Given tachycardia  associated with vomiting and diarrhea, she will be given IV fluids and antibiotics. No abdominal tenderness or pain. No urinary symptoms. Pricilla Loveless, MD 08/20 [phone]    Patient continues to have no abd pain. Nausea resolved. Dizziness better with IV fluids. Her WBC is impressive at 28K but she has no fever, abd pain, bloody stools, etc. Likely stress response. I have discussed this with her and given how well she appears and now that her HR is normal, I think she is stable for D/C with outpatient f/u. Discussed strict return precautions.  Final Clinical Impressions(s) / ED Diagnoses   Final diagnoses:  Nausea vomiting and diarrhea    New Prescriptions New Prescriptions   ONDANSETRON (ZOFRAN ODT) 4 MG DISINTEGRATING TABLET    Take 1 tablet (4 mg total) by mouth every 8 (eight) hours as needed for nausea or vomiting.     Pricilla LovelessScott Tobin Witucki, MD 01/07/16 2125

## 2016-01-07 NOTE — Discharge Instructions (Signed)
Return to the ER immediately if you are vomiting is uncontrolled, you are vomiting blood, you have abdominal pain, blood in your stool, fevers, or other concerning symptoms. Otherwise follow-up with your primary care doctor in 3 days for a recheck.

## 2016-01-07 NOTE — ED Triage Notes (Signed)
Pt reports n/v/d since this afternoon, denies abdominal pain at this time.  Pt reports feeling "hot" . Pt denies urinary symptoms.

## 2016-03-19 ENCOUNTER — Emergency Department (HOSPITAL_COMMUNITY)
Admission: EM | Admit: 2016-03-19 | Discharge: 2016-03-19 | Disposition: A | Discharge status: Home / Self Care | Payer: Self-pay | Attending: Emergency Medicine | Admitting: Emergency Medicine

## 2016-03-19 ENCOUNTER — Encounter (HOSPITAL_COMMUNITY): Payer: Self-pay | Admitting: *Deleted

## 2016-03-19 DIAGNOSIS — I10 Essential (primary) hypertension: Secondary | ICD-10-CM | POA: Insufficient documentation

## 2016-03-19 DIAGNOSIS — Z79899 Other long term (current) drug therapy: Secondary | ICD-10-CM | POA: Insufficient documentation

## 2016-03-19 DIAGNOSIS — N3001 Acute cystitis with hematuria: Secondary | ICD-10-CM | POA: Insufficient documentation

## 2016-03-19 DIAGNOSIS — Z87891 Personal history of nicotine dependence: Secondary | ICD-10-CM | POA: Insufficient documentation

## 2016-03-19 LAB — URINALYSIS, ROUTINE W REFLEX MICROSCOPIC
BILIRUBIN URINE: NEGATIVE
Glucose, UA: NEGATIVE mg/dL
Ketones, ur: NEGATIVE mg/dL
NITRITE: NEGATIVE
SPECIFIC GRAVITY, URINE: 1.02 (ref 1.005–1.030)
pH: 6.5 (ref 5.0–8.0)

## 2016-03-19 LAB — URINE MICROSCOPIC-ADD ON

## 2016-03-19 LAB — PREGNANCY, URINE: PREG TEST UR: NEGATIVE

## 2016-03-19 MED ORDER — CEPHALEXIN 500 MG PO CAPS: 500.0000 mg | ORAL_CAPSULE | Freq: Three times a day (TID) | ORAL | 0 refills | Status: DC

## 2016-03-19 MED ORDER — CEPHALEXIN 500 MG PO CAPS
500.0000 mg | ORAL_CAPSULE | Freq: Once | ORAL | Status: AC
Start: 2016-03-19 — End: 2016-03-19
  Administered 2016-03-19: 500 mg via ORAL
  Filled 2016-03-19: qty 1

## 2016-03-19 MED ORDER — PHENAZOPYRIDINE HCL 100 MG PO TABS
200.0000 mg | ORAL_TABLET | Freq: Once | ORAL | Status: AC
  Administered 2016-03-19: 200 mg via ORAL
  Filled 2016-03-19: qty 2

## 2016-03-19 MED ORDER — PHENAZOPYRIDINE HCL 200 MG PO TABS: 200.0000 mg | ORAL_TABLET | Freq: Three times a day (TID) | ORAL | 0 refills | Status: DC

## 2016-03-19 NOTE — ED Provider Notes (Signed)
[facility] Provider Note   CSN: [id number] Arrival date & time: 03/19/16  0449  Time seen 05:25 AM   History   Chief Complaint Chief Complaint  Patient presents with  . Urinary Retention    HPI Jillian Mcpherson is a 25 y.o. female.  HPI patient states yesterday about 11 AM she got up and when she went to the bathroom she urinated much less than usual. She then noted that she had to keep going back to use the bathroom and she has had the urge to urinate all day. She states she's only urinating small amounts at a time. She denies dysuria. She states last evening she wiped once and there were 2 small specks of blood on the toilet paper. She denies abdominal pain but she does have achiness on her sides indicates the lateral aspect of her abdomen each wean the rib cage in the pelvis bone laterally. She has some lower sacral pain. She denies nausea, vomiting, or fever. She's never had this before. She denies doing any bubble baths or feminine products.  PCP [facility] Department   Past Medical History:  Diagnosis Date  . Anxiety   . Depression   . Hypertension     Patient Active Problem List   Diagnosis Date Noted  . S/P C-section 11/18/2013  . Breech birth 11/15/2013  . Breech presentation 11/01/2013  . Supervision of normal first pregnancy 08/09/2013  . Late prenatal care complicating pregnancy 08/09/2013    Past Surgical History:  Procedure Laterality Date  . CESAREAN SECTION N/A 11/18/2013   Procedure: CESAREAN SECTION;  Surgeon: Lazaro Arms, MD;  Location: [facility];  Service: Obstetrics;  Laterality: N/A;  . CHOLECYSTECTOMY N/A 01/03/2014   Procedure: LAPAROSCOPIC CHOLECYSTECTOMY;  Surgeon: Dalia Heading, MD;  Location: [facility];  Service: General;  Laterality: N/A;  . TONSILLECTOMY      OB History    Gravida Para Term Preterm AB Living   1 1 1     1    SAB TAB Ectopic Multiple Live Births           1       Home Medications    Mirena Prior to  Admission medications   Medication Sig Start Date End Date Taking? Authorizing Provider  cephALEXin (KEFLEX) 500 MG capsule Take 1 capsule (500 mg total) by mouth 3 (three) times daily. 03/19/16   Devoria Albe, MD  METOPROLOL TARTRATE PO Take 1 tablet by mouth daily.    Historical Provider, MD  phenazopyridine (PYRIDIUM) 200 MG tablet Take 1 tablet (200 mg total) by mouth 3 (three) times daily. 03/19/16   Devoria Albe, MD    Family History Family History  Problem Relation Age of Onset  . Hypertension Mother   . Heart disease Mother   . Hypertension Father   . Asthma Father   . Heart disease Maternal Grandmother   . COPD Paternal Grandmother   . Cancer Other     great grandmother    Social History Social History  Substance Use Topics  . Smoking status: Former Smoker    Packs/day: 1.00    Years: 3.00    Types: Cigarettes    Quit date: 01/01/2011  . Smokeless tobacco: Never Used  . Alcohol use No  Unemployed   Allergies   Review of patient's allergies indicates no known allergies.   Review of Systems Review of Systems  All other systems reviewed and are negative.    Physical Exam Updated Vital  Signs BP 142/95 (BP Location: Left Arm)   Pulse 102   Temp 98.3 F (36.8 C) (Oral)   Resp 20   Ht 5\' 4"  (1.626 m)   Wt 260 lb (117.9 kg)   SpO2 100%   BMI 44.63 kg/m   Vital signs normal    Physical Exam  Constitutional: She is oriented to person, place, and time. She appears well-developed and well-nourished.  Non-toxic appearance. She does not appear ill. No distress.  Obese, smiling in NAD  HENT:  Head: Normocephalic and atraumatic.  Right Ear: External ear normal.  Left Ear: External ear normal.  Nose: Nose normal. No mucosal edema or rhinorrhea.  Mouth/Throat: Oropharynx is clear and moist and mucous membranes are normal. No dental abscesses or uvula swelling.  Eyes: Conjunctivae and EOM are normal. Pupils are equal, round, and reactive to light.  She has fatty  deposits on her upper eyelid on the left  Neck: Normal range of motion and full passive range of motion without pain. Neck supple.  Cardiovascular: Normal rate, regular rhythm and normal heart sounds.  Exam reveals no gallop and no friction rub.   No murmur heard. Pulmonary/Chest: Effort normal and breath sounds normal. No respiratory distress. She has no wheezes. She has no rhonchi. She has no rales. She exhibits no tenderness and no crepitus.  Abdominal: Soft. Normal appearance and bowel sounds are normal. She exhibits no distension. There is no tenderness. There is no rebound and no guarding.  Genitourinary:  Genitourinary Comments: No CVAT  Musculoskeletal: Normal range of motion. She exhibits no edema or tenderness.  Moves all extremities well.   Neurological: She is alert and oriented to person, place, and time. She has normal strength. No cranial nerve deficit.  Skin: Skin is warm, dry and intact. No rash noted. No erythema. No pallor.  Psychiatric: She has a normal mood and affect. Her speech is normal and behavior is normal. Her mood appears not anxious.  Nursing note and vitals reviewed.    ED Treatments / Results  Labs (all labs ordered are listed, but only abnormal results are displayed) Results for orders placed or performed during the hospital encounter of 03/19/16  Urinalysis, Routine w reflex microscopic  Result Value Ref Range   Color, Urine YELLOW YELLOW   APPearance CLEAR CLEAR   Specific Gravity, Urine 1.020 1.005 - 1.030   pH 6.5 5.0 - 8.0   Glucose, UA NEGATIVE NEGATIVE mg/dL   Hgb urine dipstick LARGE (A) NEGATIVE   Bilirubin Urine NEGATIVE NEGATIVE   Ketones, ur NEGATIVE NEGATIVE mg/dL   Protein, ur TRACE (A) NEGATIVE mg/dL   Nitrite NEGATIVE NEGATIVE   Leukocytes, UA SMALL (A) NEGATIVE  Pregnancy, urine  Result Value Ref Range   Preg Test, Ur NEGATIVE NEGATIVE  Urine microscopic-add on  Result Value Ref Range   Squamous Epithelial / LPF TOO NUMEROUS TO  COUNT (A) NONE SEEN   WBC, UA TOO NUMEROUS TO COUNT 0 - 5 WBC/hpf   RBC / HPF TOO NUMEROUS TO COUNT 0 - 5 RBC/hpf   Bacteria, UA MANY (A) NONE SEEN   Laboratory interpretation all normal except  Contaminated UA but most likely UTI   Procedures Procedures (including critical care time)  Medications Ordered in ED Medications  cephALEXin (KEFLEX) capsule 500 mg (not administered)  phenazopyridine (PYRIDIUM) tablet 200 mg (not administered)     Initial Impression / Assessment and Plan / ED Course  I have reviewed the triage vital signs and the nursing notes.  Pertinent labs & imaging results that were available during my care of the patient were reviewed by me and considered in my medical decision making (see chart for details).  Clinical Course   Patient was given Pyridium for her frequency and started on Keflex for presumed UTI. She was advised to return if she got worsening symptoms such as vomiting, abdominal pain, fever  Final Clinical Impressions(s) / ED Diagnoses   Final diagnoses:  Acute cystitis with hematuria    New Prescriptions New Prescriptions   CEPHALEXIN (KEFLEX) 500 MG CAPSULE    Take 1 capsule (500 mg total) by mouth 3 (three) times daily.   PHENAZOPYRIDINE (PYRIDIUM) 200 MG TABLET    Take 1 tablet (200 mg total) by mouth 3 (three) times daily.   Plan discharge  Devoria AlbeIva Mally Gavina, MD, Concha PyoFACEP    Devin Ganaway, MD 03/19/16 [phone]

## 2016-03-19 NOTE — ED Triage Notes (Signed)
Pt c/o urinary retention and small amount of blood with urination; pt denies any dysuria; pt c/o lower back pain for the last 3-4 days

## 2016-03-19 NOTE — Discharge Instructions (Signed)
Take the antibiotics, cephalexin for 5 days. Take the pyridium to help you from urinating so frequently.  It will stain your underwear if you leak so wear old underwear or panty liners.   Recheck if you aren't improving over the next 48 hrs or if you get abdominal pain, flank pain, vomiting or fever.

## 2016-06-29 ENCOUNTER — Encounter (HOSPITAL_COMMUNITY): Payer: Self-pay | Admitting: Emergency Medicine

## 2016-06-29 ENCOUNTER — Emergency Department (HOSPITAL_COMMUNITY)
Admission: EM | Admit: 2016-06-29 | Discharge: 2016-06-29 | Disposition: A | Discharge status: Home / Self Care | Payer: Medicaid Other | Attending: Emergency Medicine | Admitting: Emergency Medicine

## 2016-06-29 DIAGNOSIS — Z79899 Other long term (current) drug therapy: Secondary | ICD-10-CM | POA: Insufficient documentation

## 2016-06-29 DIAGNOSIS — K029 Dental caries, unspecified: Secondary | ICD-10-CM | POA: Insufficient documentation

## 2016-06-29 DIAGNOSIS — I10 Essential (primary) hypertension: Secondary | ICD-10-CM | POA: Diagnosis not present

## 2016-06-29 DIAGNOSIS — K0889 Other specified disorders of teeth and supporting structures: Secondary | ICD-10-CM

## 2016-06-29 DIAGNOSIS — K047 Periapical abscess without sinus: Secondary | ICD-10-CM | POA: Insufficient documentation

## 2016-06-29 DIAGNOSIS — Z87891 Personal history of nicotine dependence: Secondary | ICD-10-CM | POA: Insufficient documentation

## 2016-06-29 MED ORDER — ACETAMINOPHEN 500 MG PO TABS
1000.0000 mg | ORAL_TABLET | Freq: Once | ORAL | Status: AC
  Administered 2016-06-29: 1000 mg via ORAL
  Filled 2016-06-29: qty 2

## 2016-06-29 MED ORDER — PENICILLIN V POTASSIUM 500 MG PO TABS: 500.0000 mg | ORAL_TABLET | Freq: Four times a day (QID) | ORAL | 0 refills | Status: AC

## 2016-06-29 MED ORDER — PENICILLIN V POTASSIUM 250 MG PO TABS
500.0000 mg | ORAL_TABLET | Freq: Once | ORAL | Status: AC
  Administered 2016-06-29: 500 mg via ORAL
  Filled 2016-06-29: qty 2

## 2016-06-29 MED ORDER — IBUPROFEN 400 MG PO TABS
600.0000 mg | ORAL_TABLET | Freq: Once | ORAL | Status: AC
  Administered 2016-06-29: 600 mg via ORAL
  Filled 2016-06-29: qty 2

## 2016-06-29 NOTE — Discharge Instructions (Signed)
Take ibuprofen 600 mg + acetaminophen 1000 mg 4 times a day for pain. Gargle with warm salt water. Take the antibiotics until gone. You will need to follow up with the oral surgeon as already [facility].

## 2016-06-29 NOTE — ED Triage Notes (Signed)
Pt c/o L. Lower dental pain x 3 days.

## 2016-06-29 NOTE — ED Provider Notes (Signed)
[facility] Provider Note   CSN: [id number] Arrival date & time: 06/29/16  0249  Time seen 04:50 AM   History   Chief Complaint Chief Complaint  Patient presents with  . Dental Pain    HPI Jillian Mcpherson is a 26 y.o. female.  HPI  patient reports her left bottom and upper teeth started hurting a few days ago. She denies any fever. She states chewing and extreme hot or cold make the teeth hurt more. However she states if she holds her hand in front of her mouth and let the warm air stay in her mouth it makes it to feel better. She states [facility] the [facility] a few months ago by their dentist and was told she needs to see an oral surgeon to have her teeth extracted. She has not done that yet because she is waiting on Medicaid to come in. She denies any difficulty breathing or swallowing. She denies any facial swelling. She is not taking any oral over-the-counter pain medications in 2 days.  [facility]   Past Medical History:  Diagnosis Date  . Anxiety   . Depression   . Hypertension     Patient Active Problem List   Diagnosis Date Noted  . S/P C-section 11/18/2013  . Breech birth 11/15/2013  . Breech presentation 11/01/2013  . Supervision of normal first pregnancy 08/09/2013  . Late prenatal care complicating pregnancy 08/09/2013    Past Surgical History:  Procedure Laterality Date  . CESAREAN SECTION N/A 11/18/2013   Procedure: CESAREAN SECTION;  Surgeon: Lazaro ArmsLuther H Eure, MD;  Location: [facility];  Service: Obstetrics;  Laterality: N/A;  . CHOLECYSTECTOMY N/A 01/03/2014   Procedure: LAPAROSCOPIC CHOLECYSTECTOMY;  Surgeon: Dalia HeadingMark A Jenkins, MD;  Location: [facility];  Service: General;  Laterality: N/A;  . TONSILLECTOMY      OB History    Gravida Para Term Preterm AB Living   1 1 1     1    SAB TAB Ectopic Multiple Live Births           1       Home Medications    Prior to Admission medications   Medication Sig Start  Date End Date Taking? Authorizing Provider  METOPROLOL TARTRATE PO Take 1 tablet by mouth daily.   Yes Historical Provider, MD  cephALEXin (KEFLEX) 500 MG capsule Take 1 capsule (500 mg total) by mouth 3 (three) times daily. 03/19/16   Devoria AlbeIva Tianne Plott, MD  penicillin v potassium (VEETID) 500 MG tablet Take 1 tablet (500 mg total) by mouth 4 (four) times daily. 06/29/16 07/06/16  Devoria AlbeIva Sreenidhi Ganson, MD  phenazopyridine (PYRIDIUM) 200 MG tablet Take 1 tablet (200 mg total) by mouth 3 (three) times daily. 03/19/16   Devoria AlbeIva Elzina Devera, MD    Family History Family History  Problem Relation Age of Onset  . Hypertension Mother   . Heart disease Mother   . Hypertension Father   . Asthma Father   . Heart disease Maternal Grandmother   . COPD Paternal Grandmother   . Cancer Other     great grandmother    Social History Social History  Substance Use Topics  . Smoking status: Former Smoker    Packs/day: 1.00    Years: 3.00    Types: Cigarettes    Quit date: 01/01/2011  . Smokeless tobacco: Never Used  . Alcohol use No  umemployed   Allergies   Patient has no known allergies.   Review  of Systems Review of Systems  All other systems reviewed and are negative.    Physical Exam Updated Vital Signs BP (!) 145/104 (BP Location: Right Arm)   Pulse 98   Temp 98.8 F (37.1 C) (Oral)   Resp 20   Ht 5\' 4"  (1.626 m)   Wt 270 lb (122.5 kg)   SpO2 98%   BMI 46.35 kg/m   Vital signs normal except hypertension   Physical Exam  Constitutional: She is oriented to person, place, and time.  HENT:  Head: Normocephalic and atraumatic.  Right Ear: External ear normal.  Left Ear: External ear normal.  Mouth/Throat: Oropharynx is clear and moist and mucous membranes are normal. No trismus in the jaw. Dental caries present. No uvula swelling. No oropharyngeal exudate, posterior oropharyngeal edema, posterior oropharyngeal erythema or tonsillar abscesses.  Patient is noted to have diffuse decay of her molars. On  her left lower molar there is a lot of gum swelling present. There is no pointing or purulent seen. She's noted to have diffuse tarter on all her teeth.  Eyes: Conjunctivae and EOM are normal.  Neck: Normal range of motion.  Cardiovascular: Normal rate.   Pulmonary/Chest: Effort normal. No respiratory distress.  Musculoskeletal: Normal range of motion.  Neurological: She is alert and oriented to person, place, and time. No cranial nerve deficit.  Skin: Skin is warm and dry. Capillary refill takes less than 2 seconds.  Psychiatric: She has a normal mood and affect. Her behavior is normal.     ED Treatments / Results   Procedures Procedures (including critical care time)  Medications Ordered in ED Medications  penicillin v potassium (VEETID) tablet 500 mg (500 mg Oral Given 06/29/16 0501)  ibuprofen (ADVIL,MOTRIN) tablet 600 mg (600 mg Oral Given 06/29/16 0501)  acetaminophen (TYLENOL) tablet 1,000 mg (1,000 mg Oral Given 06/29/16 0501)     Initial Impression / Assessment and Plan / ED Course  I have reviewed the triage vital signs and the nursing notes.  Pertinent labs & imaging results that were available during my care of the patient were reviewed by me and considered in my medical decision making (see chart for details).   we discussed that she appears to be getting an infection and that one molar. She was started on antibiotics patient was advised take Motrin and then for pain which did not make her happy. She is to follow-up with an oral surgeon as artery discussed with the prior dentist.  Final Clinical Impressions(s) / ED Diagnoses   Final diagnoses:  Pain, dental  Dental infection    New Prescriptions Discharge Medication List as of 06/29/2016  4:58 AM    START taking these medications   Details  penicillin v potassium (VEETID) 500 MG tablet Take 1 tablet (500 mg total) by mouth 4 (four) times daily., Starting Sat 06/29/2016, Until Sat 07/06/2016, Print        Plan  discharge  Devoria Albe, MD, Concha Pyo, MD 06/29/16 [phone]

## 2018-05-30 DIAGNOSIS — Z975 Presence of (intrauterine) contraceptive device: Secondary | ICD-10-CM | POA: Diagnosis not present

## 2018-05-30 DIAGNOSIS — Z87891 Personal history of nicotine dependence: Secondary | ICD-10-CM | POA: Diagnosis not present

## 2018-05-30 DIAGNOSIS — H539 Unspecified visual disturbance: Secondary | ICD-10-CM | POA: Diagnosis not present

## 2018-05-30 DIAGNOSIS — R42 Dizziness and giddiness: Secondary | ICD-10-CM | POA: Diagnosis not present

## 2018-10-13 ENCOUNTER — Telehealth: Payer: Self-pay | Admitting: Adult Health

## 2018-10-28 ENCOUNTER — Telehealth: Payer: Self-pay | Admitting: *Deleted

## 2018-10-28 NOTE — Telephone Encounter (Signed)

## 2018-10-29 ENCOUNTER — Ambulatory Visit (INDEPENDENT_AMBULATORY_CARE_PROVIDER_SITE_OTHER): Payer: Medicaid Other | Admitting: Adult Health

## 2018-10-29 ENCOUNTER — Encounter: Payer: Self-pay | Admitting: Adult Health

## 2018-10-29 ENCOUNTER — Other Ambulatory Visit: Payer: Self-pay

## 2018-10-29 VITALS — BP 157/111 | HR 89 | Ht 64.25 in | Wt 299.0 lb

## 2018-10-29 DIAGNOSIS — F32A Depression, unspecified: Secondary | ICD-10-CM

## 2018-10-29 DIAGNOSIS — L292 Pruritus vulvae: Secondary | ICD-10-CM | POA: Diagnosis not present

## 2018-10-29 DIAGNOSIS — I10 Essential (primary) hypertension: Secondary | ICD-10-CM

## 2018-10-29 DIAGNOSIS — F329 Major depressive disorder, single episode, unspecified: Secondary | ICD-10-CM

## 2018-10-29 DIAGNOSIS — F419 Anxiety disorder, unspecified: Secondary | ICD-10-CM

## 2018-10-29 DIAGNOSIS — Z113 Encounter for screening for infections with a predominantly sexual mode of transmission: Secondary | ICD-10-CM | POA: Diagnosis not present

## 2018-10-29 DIAGNOSIS — Z975 Presence of (intrauterine) contraceptive device: Secondary | ICD-10-CM

## 2018-10-29 MED ORDER — NYSTATIN-TRIAMCINOLONE 100000-0.1 UNIT/GM-% EX OINT: 1.0000 "application " | TOPICAL_OINTMENT | Freq: Two times a day (BID) | CUTANEOUS | 2 refills | Status: DC

## 2018-10-29 MED ORDER — LISINOPRIL-HYDROCHLOROTHIAZIDE 10-12.5 MG PO TABS: 1.0000 | ORAL_TABLET | Freq: Every day | ORAL | 6 refills | Status: DC

## 2018-10-29 MED ORDER — ESCITALOPRAM OXALATE 10 MG PO TABS: 10.0000 mg | ORAL_TABLET | Freq: Every day | ORAL | 3 refills | Status: DC

## 2018-10-29 NOTE — Patient Instructions (Signed)
DASH Eating Plan  DASH stands for "Dietary Approaches to Stop Hypertension." The DASH eating plan is a healthy eating plan that has been shown to reduce high blood pressure (hypertension). It may also reduce your risk for type 2 diabetes, heart disease, and stroke. The DASH eating plan may also help with weight loss.  What are tips for following this plan?    General guidelines   Avoid eating more than 2,300 mg (milligrams) of salt (sodium) a day. If you have hypertension, you may need to reduce your sodium intake to 1,500 mg a day.   Limit alcohol intake to no more than 1 drink a day for nonpregnant women and 2 drinks a day for men. One drink equals 12 oz of beer, 5 oz of wine, or 1 oz of hard liquor.   Work with your health care provider to maintain a healthy body weight or to lose weight. Ask what an ideal weight is for you.   Get at least 30 minutes of exercise that causes your heart to beat faster (aerobic exercise) most days of the week. Activities may include walking, swimming, or biking.   Work with your health care provider or diet and nutrition specialist (dietitian) to adjust your eating plan to your individual calorie needs.  Reading food labels     Check food labels for the amount of sodium per serving. Choose foods with less than 5 percent of the Daily Value of sodium. Generally, foods with less than 300 mg of sodium per serving fit into this eating plan.   To find whole grains, look for the word "whole" as the first word in the ingredient list.  Shopping   Buy products labeled as "low-sodium" or "no salt added."   Buy fresh foods. Avoid canned foods and premade or frozen meals.  Cooking   Avoid adding salt when cooking. Use salt-free seasonings or herbs instead of table salt or sea salt. Check with your health care provider or pharmacist before using salt substitutes.   Do not fry foods. Cook foods using healthy methods such as baking, boiling, grilling, and broiling instead.   Cook with  heart-healthy oils, such as olive, canola, soybean, or sunflower oil.  Meal planning   Eat a balanced diet that includes:  ? 5 or more servings of fruits and vegetables each day. At each meal, try to fill half of your plate with fruits and vegetables.  ? Up to 6-8 servings of whole grains each day.  ? Less than 6 oz of lean meat, poultry, or fish each day. A 3-oz serving of meat is about the same size as a deck of cards. One egg equals 1 oz.  ? 2 servings of low-fat dairy each day.  ? A serving of nuts, seeds, or beans 5 times each week.  ? Heart-healthy fats. Healthy fats called Omega-3 fatty acids are found in foods such as flaxseeds and coldwater fish, like sardines, salmon, and mackerel.   Limit how much you eat of the following:  ? Canned or prepackaged foods.  ? Food that is high in trans fat, such as fried foods.  ? Food that is high in saturated fat, such as fatty meat.  ? Sweets, desserts, sugary drinks, and other foods with added sugar.  ? Full-fat dairy products.   Do not salt foods before eating.   Try to eat at least 2 vegetarian meals each week.   Eat more home-cooked food and less restaurant, buffet, and fast food.     When eating at a restaurant, ask that your food be prepared with less salt or no salt, if possible.  What foods are recommended?  The items listed may not be a complete list. Talk with your dietitian about what dietary choices are best for you.  Grains  Whole-grain or whole-wheat bread. Whole-grain or whole-wheat pasta. Brown rice. Oatmeal. Quinoa. Bulgur. Whole-grain and low-sodium cereals. Pita bread. Low-fat, low-sodium crackers. Whole-wheat flour tortillas.  Vegetables  Fresh or frozen vegetables (raw, steamed, roasted, or grilled). Low-sodium or reduced-sodium tomato and vegetable juice. Low-sodium or reduced-sodium tomato sauce and tomato paste. Low-sodium or reduced-sodium canned vegetables.  Fruits  All fresh, dried, or frozen fruit. Canned fruit in natural juice (without  added sugar).  Meat and other protein foods  Skinless chicken or turkey. Ground chicken or turkey. Pork with fat trimmed off. Fish and seafood. Egg whites. Dried beans, peas, or lentils. Unsalted nuts, nut butters, and seeds. Unsalted canned beans. Lean cuts of beef with fat trimmed off. Low-sodium, lean deli meat.  Dairy  Low-fat (1%) or fat-free (skim) milk. Fat-free, low-fat, or reduced-fat cheeses. Nonfat, low-sodium ricotta or cottage cheese. Low-fat or nonfat yogurt. Low-fat, low-sodium cheese.  Fats and oils  Soft margarine without trans fats. Vegetable oil. Low-fat, reduced-fat, or light mayonnaise and salad dressings (reduced-sodium). Canola, safflower, olive, soybean, and sunflower oils. Avocado.  Seasoning and other foods  Herbs. Spices. Seasoning mixes without salt. Unsalted popcorn and pretzels. Fat-free sweets.  What foods are not recommended?  The items listed may not be a complete list. Talk with your dietitian about what dietary choices are best for you.  Grains  Baked goods made with fat, such as croissants, muffins, or some breads. Dry pasta or rice meal packs.  Vegetables  Creamed or fried vegetables. Vegetables in a cheese sauce. Regular canned vegetables (not low-sodium or reduced-sodium). Regular canned tomato sauce and paste (not low-sodium or reduced-sodium). Regular tomato and vegetable juice (not low-sodium or reduced-sodium). Pickles. Olives.  Fruits  Canned fruit in a light or heavy syrup. Fried fruit. Fruit in cream or butter sauce.  Meat and other protein foods  Fatty cuts of meat. Ribs. Fried meat. Bacon. Sausage. Bologna and other processed lunch meats. Salami. Fatback. Hotdogs. Bratwurst. Salted nuts and seeds. Canned beans with added salt. Canned or smoked fish. Whole eggs or egg yolks. Chicken or turkey with skin.  Dairy  Whole or 2% milk, cream, and half-and-half. Whole or full-fat cream cheese. Whole-fat or sweetened yogurt. Full-fat cheese. Nondairy creamers. Whipped toppings.  Processed cheese and cheese spreads.  Fats and oils  Butter. Stick margarine. Lard. Shortening. Ghee. Bacon fat. Tropical oils, such as coconut, palm kernel, or palm oil.  Seasoning and other foods  Salted popcorn and pretzels. Onion salt, garlic salt, seasoned salt, table salt, and sea salt. Worcestershire sauce. Tartar sauce. Barbecue sauce. Teriyaki sauce. Soy sauce, including reduced-sodium. Steak sauce. Canned and packaged gravies. Fish sauce. Oyster sauce. Cocktail sauce. Horseradish that you find on the shelf. Ketchup. Mustard. Meat flavorings and tenderizers. Bouillon cubes. Hot sauce and Tabasco sauce. Premade or packaged marinades. Premade or packaged taco seasonings. Relishes. Regular salad dressings.  Where to find more information:   National Heart, Lung, and Blood Institute: www.nhlbi.nih.gov   American Heart Association: www.heart.org  Summary   The DASH eating plan is a healthy eating plan that has been shown to reduce high blood pressure (hypertension). It may also reduce your risk for type 2 diabetes, heart disease, and stroke.   With the   DASH eating plan, you should limit salt (sodium) intake to 2,300 mg a day. If you have hypertension, you may need to reduce your sodium intake to 1,500 mg a day.   When on the DASH eating plan, aim to eat more fresh fruits and vegetables, whole grains, lean proteins, low-fat dairy, and heart-healthy fats.   Work with your health care provider or diet and nutrition specialist (dietitian) to adjust your eating plan to your individual calorie needs.  This information is not intended to replace advice given to you by your health care provider. Make sure you discuss any questions you have with your health care provider.  Document Released: 04/25/2011 Document Revised: 04/29/2016 Document Reviewed: 04/29/2016  Elsevier Interactive Patient Education  2019 Elsevier Inc.

## 2018-10-29 NOTE — Progress Notes (Addendum)
Patient ID: Jillian Mcpherson, female   DOB: 06/03/1990, 28 y.o.   MRN: [id number] History of Present Illness: Jillian Mcpherson is a 28 year old white female, G1P1, in complaining of area on left labia that has itched for about 3 years.Had area that popped a while ago. She thinks last pap was <3 years ago at [facility].    Current Medications, Allergies, Past Medical History, Past Surgical History, Family History and Social History were reviewed in [employer] record.     Review of Systems: Itching left labia for about 3 years  +anxiety and depression   Physical Exam:BP (!) 157/111 (BP Location: Right Arm, Patient Position: Sitting, Cuff Size: Large)   Pulse 89   Ht 5' 4.25" (1.632 m)   Wt 299 lb (135.6 kg)   BMI 50.92 kg/m  General:  Well developed, well nourished, no acute distress Skin:  Warm and dry Neck:  Midline trachea, normal thyroid, good ROM, no lymphadenopathy Lungs; Clear to auscultation bilaterally Cardiovascular: Regular rate and rhythm Pelvic:  External genitalia is normal in appearance, except skin if left labia has texture changes, more rugae.  The vagina is normal in appearance. Urethra has no lesions or masses. The cervix is bulbous.IUD strings seen at os, Nuswab performed. Uterus is felt to be normal size, shape, and contour.  No adnexal masses or tenderness noted.Bladder is non tender, no masses felt. Psych:  No mood changes, alert and cooperative,seems happy Fall risk is low. PHQ 9 score is 19, she denies being suicidal or homicidal and was on Zoloft in high school but nothing lately,but is open to medication and she knew her BP was elevated but is not on meds.  Examination chaperoned by Levy Pupa LPN.   Impression and Plan:   1. Vulvar itching Will Rx mycolog cream to use bid to affected area and do not shave   2. Hypertension, unspecified type Will rx lisinopril/hydrochlorothiazide, and DASH diet  3. IUD (intrauterine device) in place  4.  Anxiety and depression Will rx lexapro  5. Screening examination for STD (sexually transmitted disease) - NuSwab Vaginitis Plus (VG+)  Meds ordered this encounter  Medications  . lisinopril-hydrochlorothiazide (ZESTORETIC) 10-12.5 MG tablet    Sig: Take 1 tablet by mouth daily.    Dispense:  30 tablet    Refill:  6    Order Specific Question:   Supervising Provider    Answer:   Elonda Husky, LUTHER H [[id number]]  . escitalopram (LEXAPRO) 10 MG tablet    Sig: Take 1 tablet (10 mg total) by mouth daily.    Dispense:  30 tablet    Refill:  3    Order Specific Question:   Supervising Provider    Answer:   Elonda Husky, LUTHER H [[id number]]  . nystatin-triamcinolone ointment (MYCOLOG)    Sig: Apply 1 application topically 2 (two) times daily.    Dispense:  30 g    Refill:  2    Order Specific Question:   Supervising Provider    Answer:   Tania Ade H [[id number]]  Review handout on DASH diet and Follow up with me in 4 weeks

## 2018-11-04 ENCOUNTER — Telehealth: Payer: Self-pay | Admitting: *Deleted

## 2018-11-04 LAB — NUSWAB VAGINITIS PLUS (VG+)
Candida albicans, NAA: NEGATIVE
Candida glabrata, NAA: NEGATIVE
Chlamydia trachomatis, NAA: NEGATIVE
Neisseria gonorrhoeae, NAA: NEGATIVE
Trich vag by NAA: NEGATIVE

## 2018-11-04 NOTE — Telephone Encounter (Signed)
Patient notified of negative results. Verbalized understanding.  

## 2018-11-25 ENCOUNTER — Telehealth: Payer: Self-pay | Admitting: *Deleted

## 2018-11-25 NOTE — Telephone Encounter (Signed)
I called pt no answer no voicemail

## 2018-11-26 ENCOUNTER — Ambulatory Visit (INDEPENDENT_AMBULATORY_CARE_PROVIDER_SITE_OTHER): Payer: Medicaid Other | Admitting: Adult Health

## 2018-11-26 ENCOUNTER — Other Ambulatory Visit: Payer: Self-pay

## 2018-11-26 ENCOUNTER — Encounter: Payer: Self-pay | Admitting: Adult Health

## 2018-11-26 VITALS — BP 120/76 | HR 67 | Ht 64.0 in | Wt 290.6 lb

## 2018-11-26 DIAGNOSIS — F419 Anxiety disorder, unspecified: Secondary | ICD-10-CM | POA: Diagnosis not present

## 2018-11-26 DIAGNOSIS — F329 Major depressive disorder, single episode, unspecified: Secondary | ICD-10-CM

## 2018-11-26 DIAGNOSIS — I10 Essential (primary) hypertension: Secondary | ICD-10-CM | POA: Diagnosis not present

## 2018-11-26 MED ORDER — ESCITALOPRAM OXALATE 20 MG PO TABS: 20.0000 mg | ORAL_TABLET | Freq: Every day | ORAL | 6 refills | Status: DC

## 2018-11-26 NOTE — Progress Notes (Signed)
Patient ID: Jillian Mcpherson, female   DOB: [birth year], 28 y.o.   MRN: [id number] History of Present Illness: Jillian Mcpherson is a  28 year old white female, single, G1P1, in for BP check and on starting lexapro and vulva itching.    Current Medications, Allergies, Past Medical History, Past Surgical History, Family History and Social History were reviewed in [employer] record.     Review of Systems: Fells better,  Still has some anxiety  Can't focus  Itching has stopped     Physical Exam:BP 120/76 (BP Location: Right Arm, Patient Position: Sitting, Cuff Size: Large)   Pulse 67   Ht 5\' 4"  (1.626 m)   Wt 290 lb 9.6 oz (131.8 kg)   BMI 49.88 kg/m  General:  Well developed, well nourished, no acute distress Skin:  Warm and dry Lungs; Clear to auscultation bilaterally Cardiovascular: Regular rate and rhythm  Pelvic:  External genitalia is normal in appearance, no lesions.   Psych:  No mood changes, alert and cooperative,seems happy PHQ 2 score 1. Examination chaperoned by Levy Pupa LPN Has lost 9 lbs. Praised over weight loss, keep working on it.  Impression: 1. Hypertension, unspecified type   2. Anxiety and depression       Plan: Will increase lexapro to 20 mg  Meds ordered this encounter  Medications  . escitalopram (LEXAPRO) 20 MG tablet    Sig: Take 1 tablet (20 mg total) by mouth daily.    Dispense:  30 tablet    Refill:  6    Order Specific Question:   Supervising Provider    Answer:   Elonda Husky, LUTHER H [[id number]]  Continue BP meds Use mycolog prn  Follow up in 6 weeks

## 2019-01-07 ENCOUNTER — Ambulatory Visit: Payer: Medicaid Other | Admitting: Adult Health

## 2019-02-22 ENCOUNTER — Telehealth: Payer: Self-pay | Admitting: Adult Health

## 2019-02-22 NOTE — Telephone Encounter (Signed)

## 2019-02-23 ENCOUNTER — Ambulatory Visit: Payer: Medicaid Other | Admitting: Adult Health

## 2019-02-26 ENCOUNTER — Telehealth: Payer: Self-pay | Admitting: Adult Health

## 2019-02-26 NOTE — Telephone Encounter (Signed)

## 2019-03-01 ENCOUNTER — Ambulatory Visit: Payer: Medicaid Other | Admitting: Adult Health

## 2019-03-01 ENCOUNTER — Ambulatory Visit (INDEPENDENT_AMBULATORY_CARE_PROVIDER_SITE_OTHER): Payer: Medicaid Other | Admitting: Adult Health

## 2019-03-01 ENCOUNTER — Encounter: Payer: Self-pay | Admitting: Adult Health

## 2019-03-01 ENCOUNTER — Other Ambulatory Visit: Payer: Self-pay

## 2019-03-01 VITALS — BP 118/76 | HR 80 | Ht 64.5 in | Wt 292.8 lb

## 2019-03-01 DIAGNOSIS — F329 Major depressive disorder, single episode, unspecified: Secondary | ICD-10-CM | POA: Insufficient documentation

## 2019-03-01 DIAGNOSIS — F419 Anxiety disorder, unspecified: Secondary | ICD-10-CM | POA: Diagnosis not present

## 2019-03-01 DIAGNOSIS — Z8679 Personal history of other diseases of the circulatory system: Secondary | ICD-10-CM | POA: Insufficient documentation

## 2019-03-01 DIAGNOSIS — I10 Essential (primary) hypertension: Secondary | ICD-10-CM | POA: Diagnosis not present

## 2019-03-01 DIAGNOSIS — F32A Depression, unspecified: Secondary | ICD-10-CM | POA: Insufficient documentation

## 2019-03-01 MED ORDER — BUSPIRONE HCL 5 MG PO TABS: 5.0000 mg | ORAL_TABLET | Freq: Three times a day (TID) | ORAL | 3 refills | Status: DC

## 2019-03-01 MED ORDER — ESCITALOPRAM OXALATE 20 MG PO TABS: 20.0000 mg | ORAL_TABLET | Freq: Every day | ORAL | 6 refills | Status: DC

## 2019-03-01 MED ORDER — LISINOPRIL-HYDROCHLOROTHIAZIDE 10-12.5 MG PO TABS: 1.0000 | ORAL_TABLET | Freq: Every day | ORAL | 6 refills | Status: DC

## 2019-03-01 NOTE — Progress Notes (Signed)
  Subjective:     Patient ID: Jillian Mcpherson, female   DOB: 07-13-1990, 28 y.o.   MRN: [id number]  HPI Jillian Mcpherson is a  28 year old white female, G1P1 in for follow up on taking lexapro 20 mg and the depression is better still has anxiety. PCP is [facility]  Review of Systems +anxiety Depression is better   Reviewed past medical,surgical, social and family history. Reviewed medications and allergies.     Objective:   Physical Exam BP 118/76 (BP Location: Left Arm, Patient Position: Sitting, Cuff Size: Large)   Pulse 80   Ht 5' 4.5" (1.638 m)   Wt 292 lb 12.8 oz (132.8 kg)   BMI 49.48 kg/m   Skin warm and dry.  Lungs: clear to ausculation bilaterally. Cardiovascular: regular rate and rhythm.   PHQ 2 score 0. Will add buspar to lexapro. Continue BP Meds.  Assessment:     1. Anxiety and depression   2. Hypertension, unspecified type       Plan:     Meds ordered this encounter  Medications  . escitalopram (LEXAPRO) 20 MG tablet    Sig: Take 1 tablet (20 mg total) by mouth daily.    Dispense:  30 tablet    Refill:  6    Order Specific Question:   Supervising Provider    Answer:   Elonda Husky, LUTHER H [[id number]]  . lisinopril-hydrochlorothiazide (ZESTORETIC) 10-12.5 MG tablet    Sig: Take 1 tablet by mouth daily.    Dispense:  30 tablet    Refill:  6    Order Specific Question:   Supervising Provider    Answer:   Elonda Husky, LUTHER H [[id number]]  . busPIRone (BUSPAR) 5 MG tablet    Sig: Take 1 tablet (5 mg total) by mouth 3 (three) times daily.    Dispense:  90 tablet    Refill:  3    Order Specific Question:   Supervising Provider    Answer:   Tania Ade H [[id number]]  Follow up in 8 weeks

## 2019-03-02 ENCOUNTER — Ambulatory Visit: Payer: Medicaid Other | Admitting: Adult Health

## 2019-04-23 ENCOUNTER — Telehealth: Payer: Self-pay | Admitting: Adult Health

## 2019-04-23 NOTE — Telephone Encounter (Signed)

## 2019-04-26 ENCOUNTER — Ambulatory Visit: Payer: Medicaid Other | Admitting: Adult Health

## 2019-04-27 ENCOUNTER — Telehealth: Payer: Self-pay | Admitting: Adult Health

## 2019-04-27 NOTE — Telephone Encounter (Signed)

## 2019-04-28 ENCOUNTER — Ambulatory Visit (INDEPENDENT_AMBULATORY_CARE_PROVIDER_SITE_OTHER): Payer: Medicaid Other | Admitting: Adult Health

## 2019-04-28 ENCOUNTER — Other Ambulatory Visit: Payer: Self-pay

## 2019-04-28 ENCOUNTER — Encounter: Payer: Self-pay | Admitting: Adult Health

## 2019-04-28 VITALS — BP 102/68 | HR 80 | Ht 64.2 in | Wt 298.0 lb

## 2019-04-28 DIAGNOSIS — F329 Major depressive disorder, single episode, unspecified: Secondary | ICD-10-CM

## 2019-04-28 DIAGNOSIS — F32A Depression, unspecified: Secondary | ICD-10-CM

## 2019-04-28 DIAGNOSIS — F419 Anxiety disorder, unspecified: Secondary | ICD-10-CM | POA: Diagnosis not present

## 2019-04-28 MED ORDER — BUSPIRONE HCL 10 MG PO TABS: 10.0000 mg | ORAL_TABLET | Freq: Two times a day (BID) | ORAL | 3 refills | Status: DC

## 2019-04-28 NOTE — Progress Notes (Signed)
  Subjective:     Patient ID: Jillian Mcpherson, female   DOB: Mar 04, 1991, 28 y.o.   MRN: [id number]  HPI Jillian Mcpherson is a 28 year old white female, engaged,G1P1 back in follow up on anxiety and depression and is better, but still has anxiety.  [facility]  Review of Systems Not as depressed feels better, but still has anxiety Thinking about having IUD removed. Increase hair on chin, has to wax  Reviewed past medical,surgical, social and family history. Reviewed medications and allergies.     Objective:   Physical Exam BP 102/68 (BP Location: Left Arm, Patient Position: Sitting, Cuff Size: Large)   Pulse 80   Ht 5' 4.2" (1.631 m)   Wt 298 lb (135.2 kg)   BMI 50.83 kg/m   Skin warm and dry. Neck: mid line trachea, normal thyroid, good ROM, no lymphadenopathy noted. Lungs: clear to ausculation bilaterally. Cardiovascular: regular rate and rhythm. PHQ 9 score is 9, but denies any SI, feels happy but has anxiety. Exam by Weyman Croon FNP student.     Assessment:     1. Anxiety and depression       Plan:     Continue lexapro Will change buspar to 10 mg bid Meds ordered this encounter  Medications  . busPIRone (BUSPAR) 10 MG tablet    Sig: Take 1 tablet (10 mg total) by mouth 2 (two) times daily.    Dispense:  60 tablet    Refill:  3    Order Specific Question:   Supervising Provider    Answer:   Florian Buff [[id number]]  Return in January for IUD removal Try to get out and walk more

## 2019-05-27 ENCOUNTER — Telehealth: Payer: Self-pay | Admitting: Adult Health

## 2019-05-27 NOTE — Telephone Encounter (Signed)
Tried to reach the patient to remind her of her appointment/restrictions, mailbox not setup. °

## 2019-05-31 ENCOUNTER — Ambulatory Visit (INDEPENDENT_AMBULATORY_CARE_PROVIDER_SITE_OTHER): Payer: Medicaid Other | Admitting: Adult Health

## 2019-05-31 ENCOUNTER — Other Ambulatory Visit: Payer: Self-pay

## 2019-05-31 ENCOUNTER — Encounter: Payer: Self-pay | Admitting: Adult Health

## 2019-05-31 VITALS — BP 138/86 | HR 95 | Ht 64.5 in | Wt 305.5 lb

## 2019-05-31 DIAGNOSIS — Z30432 Encounter for removal of intrauterine contraceptive device: Secondary | ICD-10-CM | POA: Diagnosis not present

## 2019-05-31 DIAGNOSIS — Z319 Encounter for procreative management, unspecified: Secondary | ICD-10-CM | POA: Insufficient documentation

## 2019-05-31 MED ORDER — PRENATAL PLUS 27-1 MG PO TABS: 1.0000 | ORAL_TABLET | Freq: Every day | ORAL | 12 refills | Status: DC

## 2019-05-31 NOTE — Progress Notes (Signed)
  Subjective:     Patient ID: Jillian Mcpherson, female   DOB: 03/13/1991, 29 y.o.   MRN: [id number]  HPI Jillian Mcpherson is a 29 year old white female, single, G1P1 in for IUD removal, she wants to get pregnant.   Review of Systems For IUD removal Would like to get pregnant Reviewed past medical,surgical, social and family history. Reviewed medications and allergies.     Objective:   Physical Exam BP 138/86 (BP Location: Left Arm, Patient Position: Sitting, Cuff Size: Normal)   Pulse 95   Ht 5' 4.5" (1.638 m)   Wt (!) 305 lb 8 oz (138.6 kg)   LMP 05/29/2019   BMI 51.63 kg/m   Consent signed for IUD removal Skin warm and dry.Pelvic: external genitalia is normal in appearance no lesions, vagina: +period blood,urethra has no lesions or masses noted, cervix:smooth and bulbous, IUD strings grasped with forceps, pt asked to cough and IUD easily removed, uterus: normal size, shape and contour, non tender, no masses felt, adnexa: no masses or tenderness noted. Bladder is non tender and no masses felt.    Fall risk is low Examination chaperoned by Federico Flake CMA.  Assessment:     1. Encounter for IUD removal   2. Patient desires pregnancy Will rx PNV Discussed timing of sex  Meds ordered this encounter  Medications  . prenatal vitamin w/FE, FA (PRENATAL 1 + 1) 27-1 MG TABS tablet    Sig: Take 1 tablet by mouth daily at 12 noon.    Dispense:  30 tablet    Refill:  12    Order Specific Question:   Supervising Provider    Answer:   Lazaro Arms [[id number]]      Plan:     Return in 4 weeks for pap and physical

## 2019-06-25 ENCOUNTER — Telehealth: Payer: Self-pay | Admitting: Adult Health

## 2019-06-25 NOTE — Telephone Encounter (Signed)
Tried to reach the patient to remind him of his appointment/restrictions, mb not setup.

## 2019-06-28 ENCOUNTER — Encounter: Payer: Self-pay | Admitting: Adult Health

## 2019-06-28 ENCOUNTER — Other Ambulatory Visit (HOSPITAL_COMMUNITY)
Admission: RE | Admit: 2019-06-28 | Discharge: 2019-06-28 | Disposition: A | Discharge status: Home / Self Care | Payer: Medicaid Other | Source: Ambulatory Visit | Attending: Adult Health | Admitting: Adult Health

## 2019-06-28 ENCOUNTER — Ambulatory Visit (INDEPENDENT_AMBULATORY_CARE_PROVIDER_SITE_OTHER): Payer: Medicaid Other | Admitting: Adult Health

## 2019-06-28 ENCOUNTER — Other Ambulatory Visit: Payer: Self-pay

## 2019-06-28 VITALS — BP 144/97 | HR 80 | Ht 64.0 in | Wt 305.0 lb

## 2019-06-28 DIAGNOSIS — Z01419 Encounter for gynecological examination (general) (routine) without abnormal findings: Secondary | ICD-10-CM | POA: Insufficient documentation

## 2019-06-28 DIAGNOSIS — Z3202 Encounter for pregnancy test, result negative: Secondary | ICD-10-CM | POA: Insufficient documentation

## 2019-06-28 DIAGNOSIS — Z0000 Encounter for general adult medical examination without abnormal findings: Secondary | ICD-10-CM | POA: Insufficient documentation

## 2019-06-28 DIAGNOSIS — Z319 Encounter for procreative management, unspecified: Secondary | ICD-10-CM

## 2019-06-28 LAB — POCT URINE PREGNANCY: Preg Test, Ur: NEGATIVE

## 2019-06-28 NOTE — Progress Notes (Signed)
Patient ID: Jillian Mcpherson, female   DOB: Aug 30, 1990, 29 y.o.   MRN: [id number] History of Present Illness: Tauri is a 29 year old white female,single, G1P1 in for a well woman gyn exam and pap. PCP is [facility].   Current Medications, Allergies, Past Medical History, Past Surgical History, Family History and Social History were reviewed in [employer] record.     Review of Systems:  Patient denies any headaches, hearing loss, fatigue, blurred vision, shortness of breath, chest pain, abdominal pain, problems with bowel movements, urination, or intercourse. No joint pain or mood swings.   Physical Exam:BP (!) 144/97 (BP Location: Left Arm, Patient Position: Sitting, Cuff Size: Large)   Pulse 80   Ht 5\' 4"  (1.626 m)   Wt (!) 305 lb (138.3 kg)   LMP 05/29/2019   BMI 52.35 kg/m   UPT is negative.  General:  Well developed, well nourished, no acute distress Skin:  Warm and dry Neck:  Midline trachea, normal thyroid, good ROM, no lymphadenopathy Lungs; Clear to auscultation bilaterally Breast:  No dominant palpable mass, retraction, or nipple discharge,has some tenderness in right breast and has scarring from what appears to be folliculitis both breasts Cardiovascular: Regular rate and rhythm Abdomen:  Soft, non tender, no hepatosplenomegaly Pelvic:  External genitalia is normal in appearance, no lesions.  The vagina is normal in appearance. Urethra has no lesions or masses. The cervix is bulbous,pap with GC/CHL and high risk HPV 16/18 genotyping performed.  Uterus is felt to be normal size, shape, and contour.  No adnexal masses or tenderness noted.Bladder is non tender, no masses felt. Extremities/musculoskeletal:  No swelling or varicosities noted, no clubbing or cyanosis Psych:  No mood changes, alert and cooperative,seems happy Fall risk is low PHQ 2 score is 0. Examination chaperoned by 07/27/2019 LPN  Impression and Plan:  1. Routine medical exam   2.  Pregnancy examination or test, negative result  3. Encounter for gynecological examination with Papanicolaou smear of cervix Pap sent  Physical in 1 year Pap in 3 if normal  4. Patient desires pregnancy Take PNV Call if no period by endo next week and will check [facility]

## 2019-06-30 LAB — CYTOLOGY - PAP
Chlamydia: NEGATIVE
Comment: NEGATIVE
Comment: NEGATIVE
Comment: NORMAL
Diagnosis: NEGATIVE
High risk HPV: NEGATIVE
Neisseria Gonorrhea: NEGATIVE

## 2019-10-21 ENCOUNTER — Encounter: Payer: Self-pay | Admitting: Adult Health

## 2019-10-21 ENCOUNTER — Ambulatory Visit (INDEPENDENT_AMBULATORY_CARE_PROVIDER_SITE_OTHER): Payer: Medicaid Other | Admitting: Adult Health

## 2019-10-21 ENCOUNTER — Other Ambulatory Visit: Payer: Self-pay | Admitting: Adult Health

## 2019-10-21 VITALS — BP 134/99 | HR 100 | Ht 64.5 in | Wt 307.8 lb

## 2019-10-21 DIAGNOSIS — N926 Irregular menstruation, unspecified: Secondary | ICD-10-CM | POA: Diagnosis not present

## 2019-10-21 DIAGNOSIS — F418 Other specified anxiety disorders: Secondary | ICD-10-CM

## 2019-10-21 DIAGNOSIS — I10 Essential (primary) hypertension: Secondary | ICD-10-CM | POA: Diagnosis not present

## 2019-10-21 DIAGNOSIS — Z3202 Encounter for pregnancy test, result negative: Secondary | ICD-10-CM | POA: Diagnosis not present

## 2019-10-21 DIAGNOSIS — N912 Amenorrhea, unspecified: Secondary | ICD-10-CM

## 2019-10-21 DIAGNOSIS — Z3169 Encounter for other general counseling and advice on procreation: Secondary | ICD-10-CM | POA: Diagnosis not present

## 2019-10-21 DIAGNOSIS — F419 Anxiety disorder, unspecified: Secondary | ICD-10-CM | POA: Diagnosis not present

## 2019-10-21 DIAGNOSIS — F329 Major depressive disorder, single episode, unspecified: Secondary | ICD-10-CM

## 2019-10-21 DIAGNOSIS — Z319 Encounter for procreative management, unspecified: Secondary | ICD-10-CM

## 2019-10-21 LAB — POCT URINE PREGNANCY: Preg Test, Ur: NEGATIVE

## 2019-10-21 MED ORDER — LISINOPRIL-HYDROCHLOROTHIAZIDE 10-12.5 MG PO TABS: 1.0000 | ORAL_TABLET | Freq: Every day | ORAL | 6 refills | Status: DC

## 2019-10-21 MED ORDER — BUSPIRONE HCL 10 MG PO TABS: 10.0000 mg | ORAL_TABLET | Freq: Two times a day (BID) | ORAL | 3 refills | Status: DC

## 2019-10-21 MED ORDER — ESCITALOPRAM OXALATE 20 MG PO TABS: 20.0000 mg | ORAL_TABLET | Freq: Every day | ORAL | 6 refills | Status: DC

## 2019-10-21 NOTE — Progress Notes (Signed)
  Subjective:     Patient ID: Jillian Mcpherson, female   DOB: 1991-03-25, 29 y.o.   MRN: [id number]  HPI Jillian Mcpherson is a 29 year old white female,single,G1P1 in for UPT, has missed 2 periods, and stomach aches.She has stopped taking her meds. PCP is [facility]  Review of Systems +missed 2 periods Has some stomach ache, feels flutters after eating Does want to be pregnant    Objective:   Physical Exam BP (!) 134/99 (BP Location: Left Arm, Patient Position: Sitting, Cuff Size: Normal)   Pulse 100   Ht 5' 4.5" (1.638 m)   Wt (!) 307 lb 12.8 oz (139.6 kg)   LMP 08/01/2019   BMI 52.02 kg/m UPT is negative Skin warm and dry. Lungs: clear to ausculation bilaterally. Cardiovascular: regular rate and rhythm. AA 1 PHQ 9 score is 10, no SI, not currently taking meds, but will resume    Assessment:     1. Missed period Check QHCG and progesterone level   2. Patient desires pregnancy Take PNV  3. Anxiety and depression Start back taking meds Meds ordered this encounter  Medications  . busPIRone (BUSPAR) 10 MG tablet    Sig: Take 1 tablet (10 mg total) by mouth 2 (two) times daily.    Dispense:  60 tablet    Refill:  3    Order Specific Question:   Supervising Provider    Answer:   Despina Hidden, LUTHER H [[id number]]  . escitalopram (LEXAPRO) 20 MG tablet    Sig: Take 1 tablet (20 mg total) by mouth daily.    Dispense:  30 tablet    Refill:  6    Order Specific Question:   Supervising Provider    Answer:   Despina Hidden, LUTHER H [[id number]]  . lisinopril-hydrochlorothiazide (ZESTORETIC) 10-12.5 MG tablet    Sig: Take 1 tablet by mouth daily.    Dispense:  30 tablet    Refill:  6    Order Specific Question:   Supervising Provider    Answer:   Despina Hidden, LUTHER H [[id number]]    4. Hypertension, unspecified type Start back taking meds    Plan:     Will talk tomorrow and make appt accordingly

## 2019-10-22 ENCOUNTER — Telehealth: Payer: Self-pay | Admitting: Adult Health

## 2019-10-22 LAB — PROGESTERONE: Progesterone: 0.2 ng/mL

## 2019-10-22 LAB — BETA HCG QUANT (REF LAB): hCG Quant: <1 m[IU]/mL

## 2019-10-22 NOTE — Telephone Encounter (Signed)
Calling to lab resutls from yesterday.

## 2019-10-25 ENCOUNTER — Telehealth: Payer: Self-pay | Admitting: *Deleted

## 2019-10-25 NOTE — Telephone Encounter (Signed)
Pt aware of results and voiced understanding. Pt still hasn't started her period. I advised to give it time to see if she starts period on her own. Pt voiced understanding. JSY

## 2019-10-25 NOTE — Telephone Encounter (Signed)
-----   Message from Adline Potter, NP sent at 10/25/2019  8:32 AM EDT ----- Let pt know QHCG is negative and progesterone is low.so not pregnant and did not ovulate last month

## 2019-12-29 DIAGNOSIS — Z13220 Encounter for screening for lipoid disorders: Secondary | ICD-10-CM | POA: Diagnosis not present

## 2019-12-29 DIAGNOSIS — F329 Major depressive disorder, single episode, unspecified: Secondary | ICD-10-CM | POA: Diagnosis not present

## 2019-12-29 DIAGNOSIS — Z7689 Persons encountering health services in other specified circumstances: Secondary | ICD-10-CM | POA: Diagnosis not present

## 2019-12-29 DIAGNOSIS — Z136 Encounter for screening for cardiovascular disorders: Secondary | ICD-10-CM | POA: Diagnosis not present

## 2019-12-29 DIAGNOSIS — L0390 Cellulitis, unspecified: Secondary | ICD-10-CM | POA: Diagnosis not present

## 2019-12-29 DIAGNOSIS — I10 Essential (primary) hypertension: Secondary | ICD-10-CM | POA: Diagnosis not present

## 2019-12-29 DIAGNOSIS — F988 Other specified behavioral and emotional disorders with onset usually occurring in childhood and adolescence: Secondary | ICD-10-CM | POA: Diagnosis not present

## 2019-12-29 DIAGNOSIS — R7302 Impaired glucose tolerance (oral): Secondary | ICD-10-CM | POA: Diagnosis not present

## 2019-12-29 DIAGNOSIS — R5383 Other fatigue: Secondary | ICD-10-CM | POA: Diagnosis not present

## 2019-12-29 DIAGNOSIS — F419 Anxiety disorder, unspecified: Secondary | ICD-10-CM | POA: Diagnosis not present

## 2020-01-18 ENCOUNTER — Telehealth (HOSPITAL_COMMUNITY): Payer: Self-pay | Admitting: *Deleted

## 2020-01-18 NOTE — Telephone Encounter (Signed)
New Pt referral for therapy from [facility] [place]. Staff called patient to sch appt and patient stated she wanted to wait to do appt until office starts seeing patient in person. Staff informed patient that she have 1 month to change her mind due to sch appt due to staff do not know when office will go to in person appts and patient stated okay. Staff called referring office and informed Alexia with information and she verbalized understanding.

## 2020-02-09 DIAGNOSIS — F988 Other specified behavioral and emotional disorders with onset usually occurring in childhood and adolescence: Secondary | ICD-10-CM | POA: Diagnosis not present

## 2020-08-23 ENCOUNTER — Other Ambulatory Visit: Payer: Self-pay

## 2020-08-23 ENCOUNTER — Encounter (HOSPITAL_COMMUNITY): Payer: Self-pay

## 2020-08-23 ENCOUNTER — Emergency Department (HOSPITAL_COMMUNITY)
Admission: EM | Admit: 2020-08-23 | Discharge: 2020-08-23 | Disposition: A | Discharge status: Home / Self Care | Payer: Medicaid Other | Attending: Emergency Medicine | Admitting: Emergency Medicine

## 2020-08-23 DIAGNOSIS — R42 Dizziness and giddiness: Secondary | ICD-10-CM | POA: Insufficient documentation

## 2020-08-23 DIAGNOSIS — Z79899 Other long term (current) drug therapy: Secondary | ICD-10-CM | POA: Diagnosis not present

## 2020-08-23 DIAGNOSIS — I10 Essential (primary) hypertension: Secondary | ICD-10-CM | POA: Insufficient documentation

## 2020-08-23 DIAGNOSIS — M542 Cervicalgia: Secondary | ICD-10-CM | POA: Diagnosis not present

## 2020-08-23 DIAGNOSIS — Z87891 Personal history of nicotine dependence: Secondary | ICD-10-CM | POA: Insufficient documentation

## 2020-08-23 LAB — CBC WITH DIFFERENTIAL/PLATELET
Abs Immature Granulocytes: 0.07 10*3/uL (ref 0.00–0.07)
Basophils Absolute: 0.1 10*3/uL (ref 0.0–0.1)
Basophils Relative: 1 %
Eosinophils Absolute: 0.2 10*3/uL (ref 0.0–0.5)
Eosinophils Relative: 2 %
HCT: 42.4 % (ref 36.0–46.0)
Hemoglobin: 12.8 g/dL (ref 12.0–15.0)
Immature Granulocytes: 1 %
Lymphocytes Relative: 28 %
Lymphs Abs: 3.6 10*3/uL (ref 0.7–4.0)
MCH: 26.1 pg (ref 26.0–34.0)
MCHC: 30.2 g/dL (ref 30.0–36.0)
MCV: 86.5 fL (ref 80.0–100.0)
Monocytes Absolute: 0.7 10*3/uL (ref 0.1–1.0)
Monocytes Relative: 6 %
Neutro Abs: 8.2 10*3/uL — ABNORMAL HIGH (ref 1.7–7.7)
Neutrophils Relative %: 62 %
Platelets: 467 10*3/uL — ABNORMAL HIGH (ref 150–400)
RBC: 4.9 MIL/uL (ref 3.87–5.11)
RDW: 14.4 % (ref 11.5–15.5)
WBC: 12.8 10*3/uL — ABNORMAL HIGH (ref 4.0–10.5)
nRBC: 0 % (ref 0.0–0.2)

## 2020-08-23 LAB — BASIC METABOLIC PANEL
Anion gap: 10 (ref 5–15)
BUN: 10 mg/dL (ref 6–20)
CO2: 24 mmol/L (ref 22–32)
Calcium: 8.9 mg/dL (ref 8.9–10.3)
Chloride: 105 mmol/L (ref 98–111)
Creatinine, Ser: 0.81 mg/dL (ref 0.44–1.00)
GFR, Estimated: >60 mL/min (ref >60)
Glucose, Bld: 93 mg/dL (ref 70–99)
Potassium: 3.8 mmol/L (ref 3.5–5.1)
Sodium: 139 mmol/L (ref 135–145)

## 2020-08-23 LAB — HCG, SERUM, QUALITATIVE: Preg, Serum: NEGATIVE

## 2020-08-23 MED ORDER — MECLIZINE HCL 25 MG PO TABS: 25.0000 mg | ORAL_TABLET | Freq: Three times a day (TID) | ORAL | 0 refills | Status: DC | PRN

## 2020-08-23 MED ORDER — MECLIZINE HCL 12.5 MG PO TABS
25.0000 mg | ORAL_TABLET | Freq: Once | ORAL | Status: AC
  Administered 2020-08-23: 25 mg via ORAL
  Filled 2020-08-23: qty 2

## 2020-08-23 NOTE — Discharge Instructions (Signed)
Your work-up today was reassuring.  Your thyroid test is still pending.  The results should be back tomorrow.  You may review the results on the MyChart app.  Please follow-up with your primary care provider for recheck or you may contact the clinic listed to establish primary care.  Return emergency department if you develop any new or worsening symptoms.

## 2020-08-23 NOTE — ED Triage Notes (Signed)
Pt to er, pt states that she gets pressure in her neck and her head, states that the "pressure feels like it is building up like a balloon or something, and it will make me really dizzy like I am going to pass out"  Pt denies loc.  Denies similar events, denies hx of migraines.  Denies weakness, no slurred speech or facial droop noted.

## 2020-08-23 NOTE — ED Triage Notes (Signed)
Emergency Medicine Provider Triage Evaluation Note  Mennie R Arp , a 30 y.o. female  was evaluated in triage.  Pt complains of intermittent episodes of a pressure like sensation in her neck that radiates into her head.  Symptoms began last evening and typically last 1 to 2 minutes.  She describes sensation as a pressure that feels like a balloon blowing up in my neck and head."  Symptoms associated with dizziness and "feel like I am going to pass out."  Symptoms not associated with headache, vomiting, visual changes, facial weakness or difficulties with speech.  No history of migraine headaches.  She does endorse history of anxiety.  Review of Systems  Positive: Neck and head pain, dizziness Negative: Headache, vomiting, visual changes, sore throat or difficulty swallowing  Physical Exam  BP (!) 161/96 (BP Location: Right Arm)   Pulse (!) 101   Temp 98.6 F (37 C) (Oral)   Resp 18   Ht 5' 4.5" (1.638 m)   Wt 136.1 kg   SpO2 99%   BMI 50.70 kg/m  Gen:   Awake, no distress   HEENT:  Atraumatic, oropharynx without erythema or edema. Resp:  Normal effort  Cardiac:  Normal rate and rhythm Abd:   Nondistended, nontender  MSK:   Moves extremities without difficulty Neuro:  Speech clear   Medical Decision Making  Medically screening exam initiated at 4:33 PM.  Appropriate orders placed.  Briah R Spinnato was informed that the remainder of the evaluation will be completed by another provider, this initial triage assessment does not replace that evaluation, and the importance of remaining in the ED until their evaluation is complete.  Clinical Impression   Patient here with intermittent episodes of neck and head "pressure"  Will require further evaluation        Pauline Aus, PA-C 08/24/20 1045

## 2020-08-24 ENCOUNTER — Telehealth: Payer: Self-pay

## 2020-08-24 NOTE — ED Provider Notes (Signed)
[facility] EMERGENCY DEPARTMENT Provider Note   CSN: [id number] Arrival date & time: 08/23/20  1537     History Chief Complaint  Patient presents with  . Headache    Jillian Mcpherson is a 31 y.o. female.  HPI      Jillian Mcpherson is a 30 y.o. female with past medical history of anxiety, HTN who presents to the Emergency Department complaining of intermittent episodes of a pressure sensation form her neck radiating up into her head.  Episodes last 1-2 minutes and occur several times today.  Onset one day prior to arrival.  She describes the pressure as a feeling like " a balloon blowing up in my neck and head." when symptoms occur causes dizziness.  She denies sore throat, difficulty swallowing, headache, sinus pressure or pain, chest pain and shortness of breath. No recent fever, visual changes, or facial weakness.       Past Medical History:  Diagnosis Date  . ADD (attention deficit disorder)   . Anxiety   . Depression   . Hypertension     Patient Active Problem List   Diagnosis Date Noted  . Missed period 10/21/2019  . Encounter for gynecological examination with Papanicolaou smear of cervix 06/28/2019  . Pregnancy examination or test, negative result 06/28/2019  . Routine medical exam 06/28/2019  . Encounter for IUD removal 05/31/2019  . Patient desires pregnancy 05/31/2019  . Hypertension 03/01/2019  . Anxiety and depression 03/01/2019  . S/P C-section 11/18/2013  . Breech birth 11/15/2013  . Breech presentation 11/01/2013  . Supervision of normal first pregnancy 08/09/2013  . Late prenatal care complicating pregnancy 08/09/2013    Past Surgical History:  Procedure Laterality Date  . CESAREAN SECTION N/A 11/18/2013   Procedure: CESAREAN SECTION;  Surgeon: Lazaro Arms, MD;  Location: [facility];  Service: Obstetrics;  Laterality: N/A;  . CHOLECYSTECTOMY N/A 01/03/2014   Procedure: LAPAROSCOPIC CHOLECYSTECTOMY;  Surgeon: Dalia Heading, MD;  Location: [facility];  Service:  General;  Laterality: N/A;  . TONSILLECTOMY       OB History    Gravida  1   Para  1   Term  1   Preterm      AB      Living  1     SAB      IAB      Ectopic      Multiple      Live Births  1           Family History  Problem Relation Age of Onset  . Heart disease Mother   . Hypertension Father   . Asthma Father   . Heart disease Maternal Grandmother   . COPD Paternal Grandmother   . Cancer Other        great grandmother    Social History   Tobacco Use  . Smoking status: Former Smoker    Packs/day: 1.00    Years: 3.00    Pack years: 3.00    Types: Cigarettes    Quit date: 01/01/2011    Years since quitting: 9.6  . Smokeless tobacco: Never Used  Vaping Use  . Vaping Use: Never used  Substance Use Topics  . Alcohol use: Yes  . Drug use: No    Home Medications Prior to Admission medications   Medication Sig Start Date End Date Taking? Authorizing Provider  meclizine (ANTIVERT) 25 MG tablet Take 1 tablet (25 mg total) by mouth 3 (three) times daily as needed  for dizziness. 08/23/20  Yes Chloe Bluett, PA-C  busPIRone (BUSPAR) 10 MG tablet Take 1 tablet (10 mg total) by mouth 2 (two) times daily. Patient not taking: No sig reported 10/21/19   Cyril Mourning A, NP  escitalopram (LEXAPRO) 20 MG tablet Take 1 tablet (20 mg total) by mouth daily. Patient not taking: No sig reported 10/21/19   Cyril Mourning A, NP  lisinopril-hydrochlorothiazide (ZESTORETIC) 10-12.5 MG tablet Take 1 tablet by mouth daily. Patient not taking: No sig reported 10/21/19   Adline Potter, NP  prenatal vitamin w/FE, FA (PRENATAL 1 + 1) 27-1 MG TABS tablet Take 1 tablet by mouth daily at 12 noon. Patient not taking: No sig reported 05/31/19   Adline Potter, NP    Allergies    Patient has no known allergies.  Review of Systems   Review of Systems  Constitutional: Negative for activity change, appetite change and fever.  HENT: Negative for congestion, facial  swelling, rhinorrhea, sinus pressure, sinus pain, sore throat and trouble swallowing.   Eyes: Negative for photophobia, pain and visual disturbance.  Respiratory: Negative for shortness of breath.   Cardiovascular: Negative for chest pain.  Gastrointestinal: Negative for nausea and vomiting.  Musculoskeletal: Positive for neck pain ("neck pressure"). Negative for neck stiffness.  Skin: Negative for rash and wound.  Neurological: Positive for dizziness. Negative for syncope, facial asymmetry, speech difficulty, weakness, numbness and headaches.  Psychiatric/Behavioral: Negative for confusion and decreased concentration.    Physical Exam Updated Vital Signs BP (!) 145/88   Pulse 94   Temp 98.1 F (36.7 C)   Resp 19   Ht 5' 4.5" (1.638 m)   Wt 136.1 kg   SpO2 99%   BMI 50.70 kg/m   Physical Exam Vitals and nursing note reviewed.  Constitutional:      General: She is not in acute distress.    Appearance: Normal appearance. She is well-developed.  HENT:     Head: Normocephalic.     Right Ear: Ear canal normal.     Left Ear: Tympanic membrane and ear canal normal.     Ears:     Comments: Air fluid levels present to right middle ear.  No bulging or erythema of the TM    Nose: Nose normal.     Mouth/Throat:     Mouth: Mucous membranes are moist.     Pharynx: Oropharynx is clear. No posterior oropharyngeal erythema.  Eyes:     Extraocular Movements: Extraocular movements intact.     Conjunctiva/sclera: Conjunctivae normal.     Pupils: Pupils are equal, round, and reactive to light.  Neck:     Thyroid: No thyroid mass, thyromegaly or thyroid tenderness.     Meningeal: Kernig's sign absent.  Cardiovascular:     Rate and Rhythm: Normal rate and regular rhythm.     Pulses: Normal pulses.  Pulmonary:     Effort: Pulmonary effort is normal.     Breath sounds: Normal breath sounds. No wheezing.  Abdominal:     Palpations: Abdomen is soft.     Tenderness: There is no abdominal  tenderness. There is no guarding or rebound.  Musculoskeletal:        General: Normal range of motion.     Cervical back: Normal range of motion and neck supple.  Lymphadenopathy:     Cervical: No cervical adenopathy.  Skin:    General: Skin is warm.     Capillary Refill: Capillary refill takes less than 2 seconds.  Findings: No rash.  Neurological:     General: No focal deficit present.     Mental Status: She is alert.     Sensory: Sensation is intact. No sensory deficit.     Motor: Motor function is intact. No weakness.     Coordination: Coordination is intact.     ED Results / Procedures / Treatments   Labs (all labs ordered are listed, but only abnormal results are displayed) Labs Reviewed  CBC WITH DIFFERENTIAL/PLATELET - Abnormal; Notable for the following components:      Result Value   WBC 12.8 (*)    Platelets 467 (*)    Neutro Abs 8.2 (*)    All other components within normal limits  BASIC METABOLIC PANEL  HCG, SERUM, QUALITATIVE    EKG None  Radiology No results found.  Procedures Procedures   Medications Ordered in ED Medications  meclizine (ANTIVERT) tablet 25 mg (25 mg Oral Given 08/23/20 1959)    ED Course  I have reviewed the triage vital signs and the nursing notes.  Pertinent labs & imaging results that were available during my care of the patient were reviewed by me and considered in my medical decision making (see chart for details).    MDM Rules/Calculators/A&P                          Pt here with intermittent pressure sensations to neck and head.  No focal neuro deficits, headache, meningeal signs, facial weakness or difficulty swallowing   Pt well appearing, vitals reviewed.  Work up here unremarkable.  Cause of pt's sx's unclear.  TSH pending.  Doubt emergent process.  Pt appears appropriate for d/c home and agrees to close out pt f/u with PCP.  Return precautions discussed.    Final Clinical Impression(s) / ED Diagnoses Final  diagnoses:  Vertigo    Rx / DC Orders ED Discharge Orders         Ordered    meclizine (ANTIVERT) 25 MG tablet  3 times daily PRN        08/23/20 2051           Pauline Aus, PA-C 08/24/20 1105    Terrilee Files, MD 08/24/20 1218

## 2020-08-24 NOTE — Telephone Encounter (Signed)
Transition Care Management Unsuccessful Follow-up Telephone Call  Date of discharge and from where:  08/23/2020 from [facility]  Attempts:  1st Attempt  Reason for unsuccessful TCM follow-up call:  Unable to leave message

## 2020-08-25 NOTE — Telephone Encounter (Signed)
Transition Care Management Unsuccessful Follow-up Telephone Call  Date of discharge and from where:  08/23/2020 from [facility]  Attempts:  2nd Attempt  Reason for unsuccessful TCM follow-up call:  Unable to leave message

## 2020-08-28 NOTE — Telephone Encounter (Signed)
Transition Care Management Unsuccessful Follow-up Telephone Call  Date of discharge and from where:  08/23/2020 from [facility]  Attempts:  3rd Attempt  Reason for unsuccessful TCM follow-up call:  Unable to reach patient

## 2021-08-07 DIAGNOSIS — R112 Nausea with vomiting, unspecified: Secondary | ICD-10-CM | POA: Diagnosis not present

## 2021-08-07 DIAGNOSIS — R102 Pelvic and perineal pain: Secondary | ICD-10-CM | POA: Diagnosis not present

## 2021-08-07 DIAGNOSIS — R1032 Left lower quadrant pain: Secondary | ICD-10-CM | POA: Diagnosis not present

## 2021-08-08 DIAGNOSIS — R1032 Left lower quadrant pain: Secondary | ICD-10-CM | POA: Diagnosis not present

## 2021-08-08 DIAGNOSIS — R102 Pelvic and perineal pain: Secondary | ICD-10-CM | POA: Diagnosis not present

## 2021-10-22 ENCOUNTER — Ambulatory Visit (INDEPENDENT_AMBULATORY_CARE_PROVIDER_SITE_OTHER): Payer: Medicaid Other | Admitting: Family Medicine

## 2021-10-22 ENCOUNTER — Encounter: Payer: Self-pay | Admitting: Family Medicine

## 2021-10-22 VITALS — BP 128/72 | HR 105 | Ht 64.0 in | Wt 203.2 lb

## 2021-10-22 DIAGNOSIS — Z0001 Encounter for general adult medical examination with abnormal findings: Secondary | ICD-10-CM

## 2021-10-22 DIAGNOSIS — Z1159 Encounter for screening for other viral diseases: Secondary | ICD-10-CM

## 2021-10-22 DIAGNOSIS — R110 Nausea: Secondary | ICD-10-CM | POA: Diagnosis not present

## 2021-10-22 DIAGNOSIS — E559 Vitamin D deficiency, unspecified: Secondary | ICD-10-CM | POA: Diagnosis not present

## 2021-10-22 DIAGNOSIS — I10 Essential (primary) hypertension: Secondary | ICD-10-CM | POA: Diagnosis not present

## 2021-10-22 DIAGNOSIS — R7301 Impaired fasting glucose: Secondary | ICD-10-CM | POA: Diagnosis not present

## 2021-10-22 MED ORDER — METOCLOPRAMIDE HCL 10 MG PO TABS: 10.0000 mg | ORAL_TABLET | Freq: Three times a day (TID) | ORAL | 4 refills | Status: DC

## 2021-10-22 NOTE — Assessment & Plan Note (Signed)
-  Controlled on Reglan with refills provided

## 2021-10-22 NOTE — Progress Notes (Addendum)
New Patient Office Visit  Subjective:  Patient ID: Jillian Mcpherson, female    DOB: 1990/09/08  Age: 31 y.o. MRN: [id number]  CC:  Chief Complaint  Patient presents with   New Patient (Initial Visit)    Establishing care, pt complains of nausea, no appetite, and dizziness, onset of sx 12/18/2020.     HPI' \Tokiko'  R Scoville is a 31 y.o. female with past medical history of hypertension and nausea present for establishing care. She reports having nausea and dizziness with no appetite since 12/18/2020. She was seen in the ER on 08/07/21 for nausea and was discharged home with Reglan with relief of symptoms. She notes that all her symptoms have subsided since starting Reglan.  She reports vaping and smoking cigars.    Past Medical History:  Diagnosis Date   ADD (attention deficit disorder)    Anxiety    Depression    Hypertension     Past Surgical History:  Procedure Laterality Date   CESAREAN SECTION N/A 11/18/2013   Procedure: CESAREAN SECTION;  Surgeon: Florian Buff, MD;  Location: [facility];  Service: Obstetrics;  Laterality: N/A;   CHOLECYSTECTOMY N/A 01/03/2014   Procedure: LAPAROSCOPIC CHOLECYSTECTOMY;  Surgeon: Jamesetta So, MD;  Location: [facility];  Service: General;  Laterality: N/A;   TONSILLECTOMY      Family History  Problem Relation Age of Onset   Heart disease Mother    Hypertension Father    Asthma Father    Heart disease Maternal Grandmother    COPD Paternal Grandmother    Cancer Other        great grandmother    Social History   Socioeconomic History   Marital status: Single    Spouse name: Not on file   Number of children: 1   Years of education: Not on file   Highest education level: Not on file  Occupational History   Not on file  Tobacco Use   Smoking status: Former    Packs/day: 1.00    Years: 3.00    Pack years: 3.00    Types: Cigarettes    Quit date: 01/01/2011    Years since quitting: 10.8   Smokeless tobacco: Never  Vaping Use   Vaping Use:  Never used  Substance and Sexual Activity   Alcohol use: Yes   Drug use: No   Sexual activity: Yes    Birth control/protection: None  Other Topics Concern   Not on file  Social History Narrative   Not on file   Social Determinants of Health   Financial Resource Strain: Not on file  Food Insecurity: Not on file  Transportation Needs: Not on file  Physical Activity: Not on file  Stress: Not on file  Social Connections: Not on file  Intimate Partner Violence: Not on file    ROS Review of Systems  Constitutional:  Negative for chills and fever.  HENT:  Negative for sinus pressure, sinus pain and sneezing.   Eyes:  Negative for photophobia, pain and redness.  Respiratory:  Negative for shortness of breath.   Cardiovascular:  Negative for chest pain and palpitations.  Gastrointestinal:  Positive for nausea. Negative for blood in stool, constipation and vomiting.  Endocrine: Negative for polydipsia, polyphagia and polyuria.  Genitourinary:  Negative for dysuria, flank pain and urgency.  Musculoskeletal:  Negative for neck pain.  Skin:  Negative for rash and wound.  Neurological:  Negative for dizziness, weakness and numbness.  Psychiatric/Behavioral:  Negative for confusion, self-injury  and suicidal ideas.    Objective:   Today's Vitals: BP 128/72   Pulse (!) 105   Ht '5\' 4"'  (1.626 m)   Wt 203 lb 3.2 oz (92.2 kg)   LMP  (LMP Unknown)   SpO2 99%   BMI 34.88 kg/m   Physical Exam Constitutional:      Appearance: Normal appearance.  HENT:     Head: Normocephalic.     Right Ear: External ear normal.     Left Ear: External ear normal.     Nose: No congestion or rhinorrhea.     Mouth/Throat:     Mouth: Mucous membranes are moist.     Dentition: Abnormal dentition. Dental caries present.  Eyes:     Extraocular Movements: Extraocular movements intact.     Pupils: Pupils are equal, round, and reactive to light.     Comments: Xanthelasma near the inner canthus of the upper  eyelid   Cardiovascular:     Rate and Rhythm: Normal rate and regular rhythm.     Pulses: Normal pulses.     Heart sounds: Normal heart sounds.  Pulmonary:     Effort: Pulmonary effort is normal.     Breath sounds: Normal breath sounds.  Abdominal:     Palpations: Abdomen is soft.  Musculoskeletal:     Right lower leg: No edema.     Left lower leg: No edema.  Skin:    Capillary Refill: Capillary refill takes less than 2 seconds.     Findings: No lesion or rash.  Neurological:     Mental Status: She is alert and oriented to person, place, and time.  Psychiatric:     Comments: Normal affect    Assessment & Plan:   Problem List Items Addressed This Visit       Cardiovascular and Mediastinum   Hypertension     Other   Nausea - Primary    -Controlled on Reglan with refills provided       Relevant Medications   metoCLOPramide (REGLAN) 10 MG tablet   Other Visit Diagnoses     Need for hepatitis C screening test       Relevant Orders   Hepatitis C Antibody   Vitamin D deficiency       Relevant Orders   Vitamin D (25 hydroxy)   IFG (impaired fasting glucose)       Relevant Orders   Hemoglobin A1C   Encounter for general adult medical examination with abnormal findings       Relevant Orders   Lipid Profile   TSH + free T4   CBC with Differential/Platelet   CMP14+EGFR       Outpatient Encounter Medications as of 10/22/2021  Medication Sig   [DISCONTINUED] metoCLOPramide (REGLAN) 10 MG tablet Take 10 mg by mouth 3 (three) times daily.   busPIRone (BUSPAR) 10 MG tablet Take 1 tablet (10 mg total) by mouth 2 (two) times daily. (Patient not taking: Reported on 10/22/2021)   escitalopram (LEXAPRO) 20 MG tablet Take 1 tablet (20 mg total) by mouth daily. (Patient not taking: Reported on 10/22/2021)   lisinopril-hydrochlorothiazide (ZESTORETIC) 10-12.5 MG tablet Take 1 tablet by mouth daily. (Patient not taking: Reported on 10/22/2021)   meclizine (ANTIVERT) 25 MG tablet Take  1 tablet (25 mg total) by mouth 3 (three) times daily as needed for dizziness. (Patient not taking: Reported on 10/22/2021)   metoCLOPramide (REGLAN) 10 MG tablet Take 1 tablet (10 mg total) by mouth 3 (three) times daily.  prenatal vitamin w/FE, FA (PRENATAL 1 + 1) 27-1 MG TABS tablet Take 1 tablet by mouth daily at 12 noon. (Patient not taking: Reported on 10/22/2021)   No facility-administered encounter medications on file as of 10/22/2021.    Follow-up: No follow-ups on file.   Alvira Monday, FNP

## 2021-10-22 NOTE — Patient Instructions (Addendum)
I appreciate the opportunity to provide care to you today!    Follow up:  3 months  Labs: please stop by the lab today to get your blood drawn (CBC, CMP, TSH, Lipid profile, HgA1c, Vit D)  Screening: Hep C  Refills: Reglan     Please continue to a heart-healthy diet and increase your physical activities. Try to exercise for at least three times a week.      It was a pleasure to see you and I look forward to continuing to work together on your health and well-being. Please do not hesitate to call the office if you need care or have questions about your care.   Have a wonderful day and week. With Gratitude, Gilmore Laroche MSN, FNP-BC

## 2021-10-23 LAB — CBC WITH DIFFERENTIAL/PLATELET
Basophils Absolute: 0.1 10*3/uL (ref 0.0–0.2)
Basos: 1 %
EOS (ABSOLUTE): 0.4 10*3/uL (ref 0.0–0.4)
Eos: 3 %
Hematocrit: 35.2 % (ref 34.0–46.6)
Hemoglobin: 11 g/dL — ABNORMAL LOW (ref 11.1–15.9)
Immature Grans (Abs): 0 10*3/uL (ref 0.0–0.1)
Immature Granulocytes: 0 %
Lymphocytes Absolute: 2.9 10*3/uL (ref 0.7–3.1)
Lymphs: 23 %
MCH: 23.2 pg — ABNORMAL LOW (ref 26.6–33.0)
MCHC: 31.3 g/dL — ABNORMAL LOW (ref 31.5–35.7)
MCV: 74 fL — ABNORMAL LOW (ref 79–97)
Monocytes Absolute: 0.5 10*3/uL (ref 0.1–0.9)
Monocytes: 4 %
Neutrophils Absolute: 8.6 10*3/uL — ABNORMAL HIGH (ref 1.4–7.0)
Neutrophils: 69 %
Platelets: 573 10*3/uL — ABNORMAL HIGH (ref 150–450)
RBC: 4.74 x10E6/uL (ref 3.77–5.28)
RDW: 18.4 % — ABNORMAL HIGH (ref 11.7–15.4)
WBC: 12.5 10*3/uL — ABNORMAL HIGH (ref 3.4–10.8)

## 2021-10-23 LAB — CMP14+EGFR
ALT: 9 IU/L (ref 0–32)
AST: 23 IU/L (ref 0–40)
Albumin/Globulin Ratio: 1.3 (ref 1.2–2.2)
Albumin: 3.6 g/dL — ABNORMAL LOW (ref 3.8–4.8)
Alkaline Phosphatase: 104 IU/L (ref 44–121)
BUN/Creatinine Ratio: 9 (ref 9–23)
BUN: 6 mg/dL (ref 6–20)
Bilirubin Total: 0.3 mg/dL (ref 0.0–1.2)
CO2: 23 mmol/L (ref 20–29)
Calcium: 9 mg/dL (ref 8.7–10.2)
Chloride: 104 mmol/L (ref 96–106)
Creatinine, Ser: 0.67 mg/dL (ref 0.57–1.00)
Globulin, Total: 2.8 g/dL (ref 1.5–4.5)
Glucose: 90 mg/dL (ref 70–99)
Potassium: 4 mmol/L (ref 3.5–5.2)
Sodium: 140 mmol/L (ref 134–144)
Total Protein: 6.4 g/dL (ref 6.0–8.5)
eGFR: 119 mL/min/{1.73_m2} (ref >59)

## 2021-10-23 LAB — LIPID PANEL
Chol/HDL Ratio: 4.2 ratio (ref 0.0–4.4)
Cholesterol, Total: 156 mg/dL (ref 100–199)
HDL: 37 mg/dL — ABNORMAL LOW (ref >39)
LDL Chol Calc (NIH): 92 mg/dL (ref 0–99)
Triglycerides: 153 mg/dL — ABNORMAL HIGH (ref 0–149)
VLDL Cholesterol Cal: 27 mg/dL (ref 5–40)

## 2021-10-23 LAB — HEPATITIS C ANTIBODY: Hep C Virus Ab: NONREACTIVE

## 2021-10-23 LAB — TSH+FREE T4
Free T4: 1.03 ng/dL (ref 0.82–1.77)
TSH: 2.81 u[IU]/mL (ref 0.450–4.500)

## 2021-10-23 LAB — HEMOGLOBIN A1C
Est. average glucose Bld gHb Est-mCnc: 105 mg/dL
Hgb A1c MFr Bld: 5.3 % (ref 4.8–5.6)

## 2021-10-23 LAB — VITAMIN D 25 HYDROXY (VIT D DEFICIENCY, FRACTURES): Vit D, 25-Hydroxy: 19.4 ng/mL — ABNORMAL LOW (ref 30.0–100.0)

## 2021-10-31 ENCOUNTER — Other Ambulatory Visit: Payer: Self-pay | Admitting: Family Medicine

## 2021-10-31 DIAGNOSIS — E569 Vitamin deficiency, unspecified: Secondary | ICD-10-CM

## 2021-10-31 DIAGNOSIS — D508 Other iron deficiency anemias: Secondary | ICD-10-CM

## 2021-10-31 MED ORDER — IRON (FERROUS SULFATE) 325 (65 FE) MG PO TABS: 325.0000 mg | ORAL_TABLET | Freq: Every day | ORAL | 1 refills | Status: DC

## 2021-10-31 MED ORDER — VITAMIN D (ERGOCALCIFEROL) 1.25 MG (50000 UNIT) PO CAPS: 50000.0000 [IU] | ORAL_CAPSULE | ORAL | 1 refills | Status: DC

## 2021-10-31 NOTE — Progress Notes (Signed)
Please, inform the patient that her labs indicated that she is deficient in iron and vit.D. Her cholesterol is elevated, and I recommend low carbs and fat diet. I sent a prescription for iron supplement and Vit D supplement. She can pick up the prescriptions at the pharmacy. I want her to come for labs in 1 month to reassess her CBC/iron level.

## 2022-01-09 ENCOUNTER — Other Ambulatory Visit: Payer: Self-pay | Admitting: Family Medicine

## 2022-01-09 DIAGNOSIS — R110 Nausea: Secondary | ICD-10-CM

## 2022-01-22 ENCOUNTER — Ambulatory Visit: Payer: Medicaid Other | Admitting: Family Medicine

## 2022-01-30 ENCOUNTER — Ambulatory Visit: Payer: Medicaid Other | Admitting: Family Medicine

## 2022-01-30 ENCOUNTER — Encounter: Payer: Self-pay | Admitting: Family Medicine

## 2022-01-30 VITALS — BP 139/97 | HR 88 | Ht 64.5 in | Wt 198.1 lb

## 2022-01-30 DIAGNOSIS — E559 Vitamin D deficiency, unspecified: Secondary | ICD-10-CM | POA: Diagnosis not present

## 2022-01-30 DIAGNOSIS — F419 Anxiety disorder, unspecified: Secondary | ICD-10-CM

## 2022-01-30 DIAGNOSIS — R110 Nausea: Secondary | ICD-10-CM

## 2022-01-30 DIAGNOSIS — F32A Depression, unspecified: Secondary | ICD-10-CM

## 2022-01-30 DIAGNOSIS — Z2821 Immunization not carried out because of patient refusal: Secondary | ICD-10-CM | POA: Diagnosis not present

## 2022-01-30 DIAGNOSIS — R7301 Impaired fasting glucose: Secondary | ICD-10-CM

## 2022-01-30 MED ORDER — ESCITALOPRAM OXALATE 20 MG PO TABS: 20.0000 mg | ORAL_TABLET | Freq: Every day | ORAL | 3 refills | Status: DC

## 2022-01-30 MED ORDER — METOCLOPRAMIDE HCL 10 MG PO TABS: 10.0000 mg | ORAL_TABLET | Freq: Three times a day (TID) | ORAL | 1 refills | Status: DC

## 2022-01-30 NOTE — Assessment & Plan Note (Signed)
Refilled metoclopramide 10 mg tablet

## 2022-01-30 NOTE — Progress Notes (Signed)
Established Patient Office Visit  Subjective:  Patient ID: Jillian Mcpherson, female    DOB: 1991/01/29  Age: 31 y.o. MRN: [id number]  CC:  Chief Complaint  Patient presents with   Follow-up    Pt following up, reports nausea is still present at times, needs refill on medication, would like to discuss referral to therapist.     HPI Jillian Mcpherson is a 31 y.o. female with past medical history of nausea and anxiety presents for f/u of  chronic medical conditions.  Anxiety: she requested to speak with a therapist, noting that her anxiety has worsened. The patient, her daughter and her husband  live at home with her mother. She reports lack of personal space and increased stress. She denies SI/HI.  Nausea: reports relief of symptoms with Reglan. She noted that her appetite has improved and requested a refill of reglan.    Past Medical History:  Diagnosis Date   ADD (attention deficit disorder)    Anxiety    Depression    Hypertension     Past Surgical History:  Procedure Laterality Date   CESAREAN SECTION N/A 11/18/2013   Procedure: CESAREAN SECTION;  Surgeon: Florian Buff, MD;  Location: [facility];  Service: Obstetrics;  Laterality: N/A;   CHOLECYSTECTOMY N/A 01/03/2014   Procedure: LAPAROSCOPIC CHOLECYSTECTOMY;  Surgeon: Jamesetta So, MD;  Location: [facility];  Service: General;  Laterality: N/A;   TONSILLECTOMY      Family History  Problem Relation Age of Onset   Heart disease Mother    Hypertension Father    Asthma Father    Heart disease Maternal Grandmother    COPD Paternal Grandmother    Cancer Other        great grandmother    Social History   Socioeconomic History   Marital status: Single    Spouse name: Not on file   Number of children: 1   Years of education: Not on file   Highest education level: Not on file  Occupational History   Not on file  Tobacco Use   Smoking status: Former    Packs/day: 1.00    Years: 3.00    Total pack years: 3.00    Types:  Cigarettes    Quit date: 01/01/2011    Years since quitting: 11.0   Smokeless tobacco: Never  Vaping Use   Vaping Use: Never used  Substance and Sexual Activity   Alcohol use: Yes   Drug use: No   Sexual activity: Yes    Birth control/protection: None  Other Topics Concern   Not on file  Social History Narrative   Not on file   Social Determinants of Health   Financial Resource Strain: Not on file  Food Insecurity: No Food Insecurity (10/21/2019)   Hunger Vital Sign    Worried About Running Out of Food in the Last Year: Never true    Ran Out of Food in the Last Year: Never true  Transportation Needs: Not on file  Physical Activity: Not on file  Stress: Not on file  Social Connections: Not on file  Intimate Partner Violence: Not At Risk (10/21/2019)   Humiliation, Afraid, Rape, and Kick questionnaire    Fear of Current or Ex-Partner: No    Emotionally Abused: No    Physically Abused: No    Sexually Abused: No    Outpatient Medications Prior to Visit  Medication Sig Dispense Refill   busPIRone (BUSPAR) 10 MG tablet Take 1 tablet (10 mg  total) by mouth 2 (two) times daily. 60 tablet 3   Iron, Ferrous Sulfate, 325 (65 Fe) MG TABS Take 325 mg by mouth daily. 1 hour before meals 30 tablet 1   metoCLOPramide (REGLAN) 10 MG tablet TAKE 1 TABLET BY MOUTH THREE TIMES DAILY 30 tablet 0   Vitamin D, Ergocalciferol, (DRISDOL) 1.25 MG (50000 UNIT) CAPS capsule Take 1 capsule (50,000 Units total) by mouth every 7 (seven) days. (Patient not taking: Reported on 01/30/2022) 5 capsule 1   escitalopram (LEXAPRO) 20 MG tablet Take 1 tablet (20 mg total) by mouth daily. (Patient not taking: Reported on 10/22/2021) 30 tablet 6   lisinopril-hydrochlorothiazide (ZESTORETIC) 10-12.5 MG tablet Take 1 tablet by mouth daily. (Patient not taking: Reported on 10/22/2021) 30 tablet 6   meclizine (ANTIVERT) 25 MG tablet Take 1 tablet (25 mg total) by mouth 3 (three) times daily as needed for dizziness. (Patient not  taking: Reported on 10/22/2021) 20 tablet 0   No facility-administered medications prior to visit.    No Known Allergies  ROS Review of Systems  Constitutional:  Negative for fatigue and fever.  Respiratory:  Negative for chest tightness and shortness of breath.   Cardiovascular:  Negative for chest pain and palpitations.  Gastrointestinal:  Positive for nausea. Negative for vomiting.  Psychiatric/Behavioral:  Negative for self-injury and suicidal ideas.       Objective:    Physical Exam HENT:     Right Ear: External ear normal.     Left Ear: External ear normal.  Cardiovascular:     Rate and Rhythm: Normal rate.     Pulses: Normal pulses.     Heart sounds: Normal heart sounds.  Pulmonary:     Effort: Pulmonary effort is normal.     Breath sounds: Normal breath sounds.  Neurological:     Mental Status: She is alert.     BP (!) 139/97   Pulse 88   Ht 5' 4.5" (1.638 m)   Wt 198 lb 1.9 oz (89.9 kg)   SpO2 96%   BMI 33.48 kg/m  Wt Readings from Last 3 Encounters:  01/30/22 198 lb 1.9 oz (89.9 kg)  10/22/21 203 lb 3.2 oz (92.2 kg)  08/23/20 300 lb (136.1 kg)    Lab Results  Component Value Date   TSH 2.810 10/22/2021   Lab Results  Component Value Date   WBC 12.5 (H) 10/22/2021   HGB 11.0 (L) 10/22/2021   HCT 35.2 10/22/2021   MCV 74 (L) 10/22/2021   PLT 573 (H) 10/22/2021   Lab Results  Component Value Date   NA 140 10/22/2021   K 4.0 10/22/2021   CO2 23 10/22/2021   GLUCOSE 90 10/22/2021   BUN 6 10/22/2021   CREATININE 0.67 10/22/2021   BILITOT 0.3 10/22/2021   ALKPHOS 104 10/22/2021   AST 23 10/22/2021   ALT 9 10/22/2021   PROT 6.4 10/22/2021   ALBUMIN 3.6 (L) 10/22/2021   CALCIUM 9.0 10/22/2021   ANIONGAP 10 08/23/2020   EGFR 119 10/22/2021   Lab Results  Component Value Date   CHOL 156 10/22/2021   Lab Results  Component Value Date   HDL 37 (L) 10/22/2021   Lab Results  Component Value Date   LDLCALC 92 10/22/2021   Lab Results   Component Value Date   TRIG 153 (H) 10/22/2021   Lab Results  Component Value Date   CHOLHDL 4.2 10/22/2021   Lab Results  Component Value Date   HGBA1C 5.3 10/22/2021  Assessment & Plan:   Problem List Items Addressed This Visit       Other   Anxiety and depression - Primary    Reports worsening symptoms Denies SI/HI Will resume treatment with Lexapro Referral placed to psychiatry      Relevant Medications   escitalopram (LEXAPRO) 20 MG tablet   Other Relevant Orders   Ambulatory referral to Psychiatry   Nausea    Refilled metoclopramide 10 mg tablet      Relevant Medications   metoCLOPramide (REGLAN) 10 MG tablet   Other Visit Diagnoses     Refused influenza vaccine       Vitamin D deficiency       Relevant Orders   Vitamin D (25 hydroxy)   IFG (impaired fasting glucose)       Relevant Orders   CBC with Differential/Platelet   CMP14+EGFR   TSH + free T4   Hemoglobin A1C   Lipid panel       Meds ordered this encounter  Medications   metoCLOPramide (REGLAN) 10 MG tablet    Sig: Take 1 tablet (10 mg total) by mouth 3 (three) times daily.    Dispense:  30 tablet    Refill:  1   escitalopram (LEXAPRO) 20 MG tablet    Sig: Take 1 tablet (20 mg total) by mouth daily.    Dispense:  30 tablet    Refill:  3    Follow-up: Return in about 3 months (around 05/01/2022).    Alvira Monday, FNP

## 2022-01-30 NOTE — Patient Instructions (Addendum)
I appreciate the opportunity to provide care to you today!    Follow up:  3 months  Labs: please stop by the lab during the week to get your blood drawn (CBC, CMP, TSH, Lipid profile, HgA1c, Vit D)  Please pick up your medications at the pharmacy  Referrals today- psychiatry   Please continue to a heart-healthy diet and increase your physical activities. Try to exercise for at least three times a week.      It was a pleasure to see you and I look forward to continuing to work together on your health and well-being. Please do not hesitate to call the office if you need care or have questions about your care.   Have a wonderful day and week. With Gratitude, Gilmore Laroche MSN, FNP-BC

## 2022-01-30 NOTE — Assessment & Plan Note (Signed)
Reports worsening symptoms Denies SI/HI Will resume treatment with Lexapro Referral placed to psychiatry

## 2022-02-18 DIAGNOSIS — E559 Vitamin D deficiency, unspecified: Secondary | ICD-10-CM | POA: Diagnosis not present

## 2022-02-18 DIAGNOSIS — R7301 Impaired fasting glucose: Secondary | ICD-10-CM | POA: Diagnosis not present

## 2022-02-19 ENCOUNTER — Other Ambulatory Visit: Payer: Self-pay | Admitting: Family Medicine

## 2022-02-19 DIAGNOSIS — E569 Vitamin deficiency, unspecified: Secondary | ICD-10-CM

## 2022-02-19 LAB — LIPID PANEL
Chol/HDL Ratio: 4.4 ratio (ref 0.0–4.4)
Cholesterol, Total: 174 mg/dL (ref 100–199)
HDL: 40 mg/dL (ref >39)
LDL Chol Calc (NIH): 109 mg/dL — ABNORMAL HIGH (ref 0–99)
Triglycerides: 139 mg/dL (ref 0–149)
VLDL Cholesterol Cal: 25 mg/dL (ref 5–40)

## 2022-02-19 LAB — CBC WITH DIFFERENTIAL/PLATELET
Basophils Absolute: 0.1 10*3/uL (ref 0.0–0.2)
Basos: 1 %
EOS (ABSOLUTE): 0.2 10*3/uL (ref 0.0–0.4)
Eos: 2 %
Hematocrit: 36.6 % (ref 34.0–46.6)
Hemoglobin: 11.3 g/dL (ref 11.1–15.9)
Immature Grans (Abs): 0 10*3/uL (ref 0.0–0.1)
Immature Granulocytes: 0 %
Lymphocytes Absolute: 3.2 10*3/uL — ABNORMAL HIGH (ref 0.7–3.1)
Lymphs: 28 %
MCH: 23.8 pg — ABNORMAL LOW (ref 26.6–33.0)
MCHC: 30.9 g/dL — ABNORMAL LOW (ref 31.5–35.7)
MCV: 77 fL — ABNORMAL LOW (ref 79–97)
Monocytes Absolute: 0.4 10*3/uL (ref 0.1–0.9)
Monocytes: 4 %
Neutrophils Absolute: 7.8 10*3/uL — ABNORMAL HIGH (ref 1.4–7.0)
Neutrophils: 65 %
Platelets: 438 10*3/uL (ref 150–450)
RBC: 4.74 x10E6/uL (ref 3.77–5.28)
RDW: 16 % — ABNORMAL HIGH (ref 11.7–15.4)
WBC: 11.8 10*3/uL — ABNORMAL HIGH (ref 3.4–10.8)

## 2022-02-19 LAB — CMP14+EGFR
ALT: 9 IU/L (ref 0–32)
AST: 10 IU/L (ref 0–40)
Albumin/Globulin Ratio: 2 (ref 1.2–2.2)
Albumin: 3.9 g/dL (ref 3.9–4.9)
Alkaline Phosphatase: 73 IU/L (ref 44–121)
BUN/Creatinine Ratio: 14 (ref 9–23)
BUN: 9 mg/dL (ref 6–20)
Bilirubin Total: <0.2 mg/dL (ref 0.0–1.2)
CO2: 22 mmol/L (ref 20–29)
Calcium: 9.1 mg/dL (ref 8.7–10.2)
Chloride: 105 mmol/L (ref 96–106)
Creatinine, Ser: 0.65 mg/dL (ref 0.57–1.00)
Globulin, Total: 2 g/dL (ref 1.5–4.5)
Glucose: 80 mg/dL (ref 70–99)
Potassium: 4.6 mmol/L (ref 3.5–5.2)
Sodium: 139 mmol/L (ref 134–144)
Total Protein: 5.9 g/dL — ABNORMAL LOW (ref 6.0–8.5)
eGFR: 119 mL/min/{1.73_m2} (ref >59)

## 2022-02-19 LAB — HEMOGLOBIN A1C
Est. average glucose Bld gHb Est-mCnc: 105 mg/dL
Hgb A1c MFr Bld: 5.3 % (ref 4.8–5.6)

## 2022-02-19 LAB — TSH+FREE T4
Free T4: 1.08 ng/dL (ref 0.82–1.77)
TSH: 0.935 u[IU]/mL (ref 0.450–4.500)

## 2022-02-19 LAB — VITAMIN D 25 HYDROXY (VIT D DEFICIENCY, FRACTURES): Vit D, 25-Hydroxy: 20.1 ng/mL — ABNORMAL LOW (ref 30.0–100.0)

## 2022-02-19 MED ORDER — VITAMIN D (ERGOCALCIFEROL) 1.25 MG (50000 UNIT) PO CAPS: 50000.0000 [IU] | ORAL_CAPSULE | ORAL | 2 refills | Status: DC

## 2022-02-19 NOTE — Progress Notes (Signed)
Please inform the patient that a refill of vitamin D has been sent to her pharmacy because her vitamin D is low.  Please encourage the patient to increase her physical activities, with low carbs and a low-fat diet.

## 2022-05-03 ENCOUNTER — Ambulatory Visit: Payer: Medicaid Other | Admitting: Family Medicine

## 2022-05-03 ENCOUNTER — Encounter: Payer: Self-pay | Admitting: Family Medicine

## 2022-08-15 ENCOUNTER — Telehealth: Payer: Self-pay | Admitting: Family Medicine

## 2022-08-15 ENCOUNTER — Ambulatory Visit (INDEPENDENT_AMBULATORY_CARE_PROVIDER_SITE_OTHER): Payer: Medicaid Other | Admitting: Family Medicine

## 2022-08-15 ENCOUNTER — Encounter: Payer: Self-pay | Admitting: Family Medicine

## 2022-08-15 VITALS — BP 158/90 | HR 108 | Ht 64.5 in | Wt 188.1 lb

## 2022-08-15 DIAGNOSIS — F32A Depression, unspecified: Secondary | ICD-10-CM

## 2022-08-15 DIAGNOSIS — E7849 Other hyperlipidemia: Secondary | ICD-10-CM

## 2022-08-15 DIAGNOSIS — E038 Other specified hypothyroidism: Secondary | ICD-10-CM

## 2022-08-15 DIAGNOSIS — R7301 Impaired fasting glucose: Secondary | ICD-10-CM

## 2022-08-15 DIAGNOSIS — F419 Anxiety disorder, unspecified: Secondary | ICD-10-CM

## 2022-08-15 DIAGNOSIS — E559 Vitamin D deficiency, unspecified: Secondary | ICD-10-CM | POA: Diagnosis not present

## 2022-08-15 MED ORDER — ESCITALOPRAM OXALATE 20 MG PO TABS: 20.0000 mg | ORAL_TABLET | Freq: Every day | ORAL | 3 refills | Status: DC

## 2022-08-15 NOTE — Patient Instructions (Addendum)
  I appreciate the opportunity to provide care to you today!    Follow up: 6 weeks for anxiety and pap smear  Labs: please stop by the lab today/ during the week to get your blood drawn (CBC, CMP, TSH, Lipid profile, HgA1c, Vit D)    Referrals today- [facility] for talk therapy with Volanda Napoleon   Please continue to a heart-healthy diet and increase your physical activities. Try to exercise for 3mins at least five days a week.      It was a pleasure to see you and I look forward to continuing to work together on your health and well-being. Please do not hesitate to call the office if you need care or have questions about your care.   Have a wonderful day and week. With Gratitude, Alvira Monday MSN, FNP-BC

## 2022-08-15 NOTE — Telephone Encounter (Signed)
I have scheduled Jillian Mcpherson for Consult with Aaron Edelman for [facility] next Friday 08/23/22.  Please add the .phrase to today's dictation for this treatment.  Thanks

## 2022-08-15 NOTE — Progress Notes (Signed)
Established Patient Office Visit  Subjective:  Patient ID: Jillian Mcpherson, female    DOB: [birth year]  Age: 32 y.o. MRN: [id number]  CC:  Chief Complaint  Patient presents with   Follow-up    Following up and needs to discuss anxiety flare up.     HPI Jillian Mcpherson is a 32 y.o. female with past medical history of generalized anxiety disorder presents for f/u. For the details of today's visit, please refer to the assessment and plan.     Past Medical History:  Diagnosis Date   ADD (attention deficit disorder)    Anxiety    Depression    Hypertension     Past Surgical History:  Procedure Laterality Date   CESAREAN SECTION N/A 11/18/2013   Procedure: CESAREAN SECTION;  Surgeon: Florian Buff, MD;  Location: [facility];  Service: Obstetrics;  Laterality: N/A;   CHOLECYSTECTOMY N/A 01/03/2014   Procedure: LAPAROSCOPIC CHOLECYSTECTOMY;  Surgeon: Jamesetta So, MD;  Location: [facility];  Service: General;  Laterality: N/A;   TONSILLECTOMY      Family History  Problem Relation Age of Onset   Heart disease Mother    Hypertension Father    Asthma Father    Heart disease Maternal Grandmother    COPD Paternal Grandmother    Cancer Other        great grandmother    Social History   Socioeconomic History   Marital status: Single    Spouse name: Not on file   Number of children: 1   Years of education: Not on file   Highest education level: Not on file  Occupational History   Not on file  Tobacco Use   Smoking status: Former    Packs/day: 1.00    Years: 3.00    Additional pack years: 0.00    Total pack years: 3.00    Types: Cigarettes    Quit date: 01/01/2011    Years since quitting: 11.6   Smokeless tobacco: Never  Vaping Use   Vaping Use: Never used  Substance and Sexual Activity   Alcohol use: Yes   Drug use: No   Sexual activity: Yes    Birth control/protection: None  Other Topics Concern   Not on file  Social History Narrative   Not on file   Social Determinants  of Health   Financial Resource Strain: Not on file  Food Insecurity: No Food Insecurity (10/21/2019)   Hunger Vital Sign    Worried About Running Out of Food in the Last Year: Never true    Ran Out of Food in the Last Year: Never true  Transportation Needs: Not on file  Physical Activity: Not on file  Stress: Not on file  Social Connections: Not on file  Intimate Partner Violence: Not At Risk (10/21/2019)   Humiliation, Afraid, Rape, and Kick questionnaire    Fear of Current or Ex-Partner: No    Emotionally Abused: No    Physically Abused: No    Sexually Abused: No    Outpatient Medications Prior to Visit  Medication Sig Dispense Refill   escitalopram (LEXAPRO) 20 MG tablet Take 1 tablet (20 mg total) by mouth daily. (Patient not taking: Reported on 08/15/2022) 30 tablet 3   metoCLOPramide (REGLAN) 10 MG tablet Take 1 tablet (10 mg total) by mouth 3 (three) times daily. 30 tablet 1   Vitamin D, Ergocalciferol, (DRISDOL) 1.25 MG (50000 UNIT) CAPS capsule Take 1 capsule (50,000 Units total) by mouth every 7 (seven)  days. 5 capsule 2   No facility-administered medications prior to visit.    No Known Allergies  ROS Review of Systems  Constitutional:  Negative for chills and fever.  Eyes:  Negative for visual disturbance.  Respiratory:  Negative for chest tightness and shortness of breath.   Neurological:  Negative for dizziness and headaches.      Objective:    Physical Exam HENT:     Head: Normocephalic.     Mouth/Throat:     Mouth: Mucous membranes are moist.  Cardiovascular:     Rate and Rhythm: Normal rate.     Heart sounds: Normal heart sounds.  Pulmonary:     Effort: Pulmonary effort is normal.     Breath sounds: Normal breath sounds.  Neurological:     Mental Status: She is alert.     BP (!) 158/90 (BP Location: Left Arm)   Pulse (!) 108   Ht 5' 4.5" (1.638 m)   Wt 188 lb 1.9 oz (85.3 kg)   SpO2 97%   BMI 31.79 kg/m  Wt Readings from Last 3 Encounters:   08/15/22 188 lb 1.9 oz (85.3 kg)  01/30/22 198 lb 1.9 oz (89.9 kg)  10/22/21 203 lb 3.2 oz (92.2 kg)    Lab Results  Component Value Date   TSH 4.610 (H) 08/15/2022   Lab Results  Component Value Date   WBC 12.3 (H) 08/15/2022   HGB 12.1 08/15/2022   HCT 38.1 08/15/2022   MCV 82 08/15/2022   PLT 440 08/15/2022   Lab Results  Component Value Date   NA 140 08/15/2022   K 3.7 08/15/2022   CO2 22 08/15/2022   GLUCOSE 97 08/15/2022   BUN 6 08/15/2022   CREATININE 0.67 08/15/2022   BILITOT 0.3 08/15/2022   ALKPHOS 72 08/15/2022   AST 10 08/15/2022   ALT 8 08/15/2022   PROT 6.7 08/15/2022   ALBUMIN 3.9 08/15/2022   CALCIUM 9.3 08/15/2022   ANIONGAP 10 08/23/2020   EGFR 118 08/15/2022   Lab Results  Component Value Date   CHOL 182 08/15/2022   Lab Results  Component Value Date   HDL 41 08/15/2022   Lab Results  Component Value Date   LDLCALC 113 (H) 08/15/2022   Lab Results  Component Value Date   TRIG 156 (H) 08/15/2022   Lab Results  Component Value Date   CHOLHDL 4.4 08/15/2022   Lab Results  Component Value Date   HGBA1C 5.3 08/15/2022      Assessment & Plan:  Anxiety and depression Assessment & Plan: GAD-7 is 20 Denies suicidal thoughts and ideation Reports increased life stresses anxiety recently Will like to speak with a therapist Referral placed Reports that she has been off her Lexapro for a few months Refill sent to the pharmacy Patient verbally consented to [facility] services about presenting concerns and psychiatric consultation as appropriate.  The services will be billed as appropriate for the patient.    Orders: -     Escitalopram Oxalate; Take 1 tablet (20 mg total) by mouth daily.  Dispense: 30 tablet; Refill: 3 -     [facility] ref to [facility]  IFG (impaired fasting glucose) -     Hemoglobin A1c  Vitamin D deficiency -     VITAMIN D 25 Hydroxy (Vit-D Deficiency, Fractures)  Other  specified hypothyroidism -     TSH + free T4  Other hyperlipidemia -     Lipid panel -  CMP14+EGFR -     CBC with Differential/Platelet    Follow-up: Return in about 6 weeks (around 09/26/2022) for anxiety and depression.   Alvira Monday, FNP

## 2022-08-16 ENCOUNTER — Telehealth: Payer: Self-pay | Admitting: Family Medicine

## 2022-08-16 ENCOUNTER — Other Ambulatory Visit: Payer: Self-pay

## 2022-08-16 DIAGNOSIS — R110 Nausea: Secondary | ICD-10-CM

## 2022-08-16 LAB — CBC WITH DIFFERENTIAL/PLATELET
Basophils Absolute: 0.1 10*3/uL (ref 0.0–0.2)
Basos: 1 %
EOS (ABSOLUTE): 0.3 10*3/uL (ref 0.0–0.4)
Eos: 2 %
Hematocrit: 38.1 % (ref 34.0–46.6)
Hemoglobin: 12.1 g/dL (ref 11.1–15.9)
Immature Grans (Abs): 0.1 10*3/uL (ref 0.0–0.1)
Immature Granulocytes: 1 %
Lymphocytes Absolute: 3.6 10*3/uL — ABNORMAL HIGH (ref 0.7–3.1)
Lymphs: 30 %
MCH: 26 pg — ABNORMAL LOW (ref 26.6–33.0)
MCHC: 31.8 g/dL (ref 31.5–35.7)
MCV: 82 fL (ref 79–97)
Monocytes Absolute: 0.8 10*3/uL (ref 0.1–0.9)
Monocytes: 6 %
Neutrophils Absolute: 7.4 10*3/uL — ABNORMAL HIGH (ref 1.4–7.0)
Neutrophils: 60 %
Platelets: 440 10*3/uL (ref 150–450)
RBC: 4.65 x10E6/uL (ref 3.77–5.28)
RDW: 15.9 % — ABNORMAL HIGH (ref 11.7–15.4)
WBC: 12.3 10*3/uL — ABNORMAL HIGH (ref 3.4–10.8)

## 2022-08-16 LAB — CMP14+EGFR
ALT: 8 IU/L (ref 0–32)
AST: 10 IU/L (ref 0–40)
Albumin/Globulin Ratio: 1.4 (ref 1.2–2.2)
Albumin: 3.9 g/dL (ref 3.9–4.9)
Alkaline Phosphatase: 72 IU/L (ref 44–121)
BUN/Creatinine Ratio: 9 (ref 9–23)
BUN: 6 mg/dL (ref 6–20)
Bilirubin Total: 0.3 mg/dL (ref 0.0–1.2)
CO2: 22 mmol/L (ref 20–29)
Calcium: 9.3 mg/dL (ref 8.7–10.2)
Chloride: 103 mmol/L (ref 96–106)
Creatinine, Ser: 0.67 mg/dL (ref 0.57–1.00)
Globulin, Total: 2.8 g/dL (ref 1.5–4.5)
Glucose: 97 mg/dL (ref 70–99)
Potassium: 3.7 mmol/L (ref 3.5–5.2)
Sodium: 140 mmol/L (ref 134–144)
Total Protein: 6.7 g/dL (ref 6.0–8.5)
eGFR: 118 mL/min/{1.73_m2} (ref >59)

## 2022-08-16 LAB — LIPID PANEL
Chol/HDL Ratio: 4.4 ratio (ref 0.0–4.4)
Cholesterol, Total: 182 mg/dL (ref 100–199)
HDL: 41 mg/dL (ref >39)
LDL Chol Calc (NIH): 113 mg/dL — ABNORMAL HIGH (ref 0–99)
Triglycerides: 156 mg/dL — ABNORMAL HIGH (ref 0–149)
VLDL Cholesterol Cal: 28 mg/dL (ref 5–40)

## 2022-08-16 LAB — HEMOGLOBIN A1C
Est. average glucose Bld gHb Est-mCnc: 105 mg/dL
Hgb A1c MFr Bld: 5.3 % (ref 4.8–5.6)

## 2022-08-16 LAB — VITAMIN D 25 HYDROXY (VIT D DEFICIENCY, FRACTURES): Vit D, 25-Hydroxy: 18 ng/mL — ABNORMAL LOW (ref 30.0–100.0)

## 2022-08-16 LAB — TSH+FREE T4
Free T4: 1.4 ng/dL (ref 0.82–1.77)
TSH: 4.61 u[IU]/mL — ABNORMAL HIGH (ref 0.450–4.500)

## 2022-08-16 MED ORDER — ONDANSETRON 4 MG PO TBDP: 4.0000 mg | ORAL_TABLET | Freq: Three times a day (TID) | ORAL | 0 refills | Status: DC | PRN

## 2022-08-16 NOTE — Assessment & Plan Note (Signed)
GAD-7 is 20 Denies suicidal thoughts and ideation Reports increased life stresses anxiety recently Will like to speak with a therapist Referral placed Reports that she has been off her Lexapro for a few months Refill sent to the pharmacy Patient verbally consented to [facility] services about presenting concerns and psychiatric consultation as appropriate.  The services will be billed as appropriate for the patient.

## 2022-08-16 NOTE — Telephone Encounter (Signed)
Patient called she took her medicine and throwed it up should re take her medicine again.

## 2022-08-16 NOTE — Telephone Encounter (Signed)
Refill sent.

## 2022-08-16 NOTE — Telephone Encounter (Signed)
Patient called said needs her nausea medication refilled, patient does not know the name of medication.  Pharmacy: [facility]

## 2022-08-18 ENCOUNTER — Other Ambulatory Visit: Payer: Self-pay | Admitting: Family Medicine

## 2022-08-18 DIAGNOSIS — E559 Vitamin D deficiency, unspecified: Secondary | ICD-10-CM

## 2022-08-18 MED ORDER — VITAMIN D (ERGOCALCIFEROL) 1.25 MG (50000 UNIT) PO CAPS: 50000.0000 [IU] | ORAL_CAPSULE | ORAL | 1 refills | Status: DC

## 2022-08-22 ENCOUNTER — Other Ambulatory Visit: Payer: Self-pay | Admitting: Family Medicine

## 2022-08-22 DIAGNOSIS — R110 Nausea: Secondary | ICD-10-CM

## 2022-08-23 ENCOUNTER — Ambulatory Visit (INDEPENDENT_AMBULATORY_CARE_PROVIDER_SITE_OTHER): Payer: Medicaid Other | Admitting: Professional Counselor

## 2022-08-23 DIAGNOSIS — F32A Depression, unspecified: Secondary | ICD-10-CM | POA: Diagnosis not present

## 2022-08-23 DIAGNOSIS — F419 Anxiety disorder, unspecified: Secondary | ICD-10-CM | POA: Diagnosis not present

## 2022-08-23 NOTE — Patient Instructions (Signed)
Jillian Mcpherson is recommended to continue medication prescribed by her NP, practice mindfulness techniques when anxiety symptoms occur, decrease TV/Internet outlets that are triggering, and increase positive outlets such as Anime and Music time.
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
## 2022-09-26 ENCOUNTER — Ambulatory Visit: Payer: Medicaid Other | Admitting: Family Medicine

## 2022-10-02 ENCOUNTER — Telehealth (INDEPENDENT_AMBULATORY_CARE_PROVIDER_SITE_OTHER): Payer: Medicaid Other | Admitting: Professional Counselor

## 2022-10-02 DIAGNOSIS — F331 Major depressive disorder, recurrent, moderate: Secondary | ICD-10-CM

## 2022-10-02 NOTE — BH Specialist Note (Addendum)
Behavioral Health Treatment Plan Team Note  MRN: [id number] NAME: Jillian Mcpherson  DATE: 10/05/22  Start time: Start Time: 03103:10 End time: Stop Time: 03303:30 Total time: Total Time in Minutes (Visit): 2020  Total number of BH Treatment Team Plan encounters: 1/4  Treatment Team Attendees: Dr. Vanetta Shawl   Diagnoses:    ICD-10-CM   1. Moderate episode of recurrent major depressive disorder (HCC)  F33.1       Goals, Interventions and Follow-up Plan Goals:   Interventions: Motivational Interviewing Solution-Focused Strategies Mindfulness or Relaxation Training Medication Management Recommendations: Continue Lexapro 20 mg daily Follow-up Plan: Reach out to patient to schedule follow-up.   Collaborative Care Psychiatric Consultant Case Review    Assessment/Provisional Diagnosis Jillian Mcpherson is a 32 y.o. year old female with history of depression, anxiety, hypertension, vitamin D deficiency. The patient is referred for depression   #  MDD, recurrent, moderate She reports experiencing depressive symptoms in the context of unemployment. She previously responded well to Lexapro, which was initiated by her primary care provider. The behavioral health specialist will focus on Cognitive Behavioral Therapy and behavioral activation techniques.   Recommendation Continue lexapro 20 mg daily BH specialist to follow up bi weekly.     Thank you for your consult. We will continue to follow the patient. Please contact our collaborative care team for any questions or concerns.    The above treatment considerations and suggestions are based on consultation with the [facility] specialist and/or PCP and a review of information available in the shared registry and the patient's Electronic Health Record (EHR). I have not personally examined the patient. All recommendations should be implemented with consideration of the patient's relevant prior history and current clinical status. Please feel free to call me  with any questions about the care of this patient.    Neysa Hotter, MD   History of the present illness Presenting Problem/Current Symptoms:   Psychiatric History  Depression: Yes Anxiety: Yes Mania:  Needs further assessment Psychosis: No PTSD symptoms:  Need further assessment  Past Psychiatric History/Hospitalization(s): Hospitalization for psychiatric illness: NA Prior Suicide Attempts:  Needs further assessmnet Prior Self-injurious behavior:  Needs further assessment  Psychosocial stressors  Jillian Mcpherson is a 32 yo caucasian female from [place]. She is being referred to collaborative care for persistent Anxiety and Depression symptoms. She presents to counseling reporting her anxiety has increased significantly in recent weeks causing problems in her daily functioning. She reports obsessiveness, irrational fears, intrusive thoughts, panic and restlessness. Jillian Mcpherson denies SI and HI and has no current risk for harming herself or others. She admits that she has not been taking her medication in recents months and can notice a significant difference in her mood stability. She reports being back on medication in past week and is less symptomatic. [name] will benefit from [facility]   Self-harm Behaviors Risk Assessment   Screenings PHQ-9 Assessments:     08/27/2022    9:21 AM 08/15/2022   10:54 AM 01/30/2022    1:44 PM  Depression screen PHQ 2/9  Decreased Interest 1 1 0  Down, Depressed, Hopeless 2 2 0  PHQ - 2 Score 3 3 0  Altered sleeping 3 3   Tired, decreased energy 3 3   Change in appetite 2 2   Feeling bad or failure about yourself  2 2   Trouble concentrating 3 3   Moving slowly or fidgety/restless 0 0   Suicidal thoughts 1 1   PHQ-9 Score 17 17  Difficult doing work/chores Very difficult Very difficult    GAD-7 Assessments:     08/27/2022    9:22 AM 08/15/2022   10:54 AM 01/30/2022    1:44 PM 10/21/2019   12:06 PM  GAD 7 : Generalized Anxiety Score   Nervous, Anxious, on Edge 3 3 3 1   Control/stop worrying 3 3 3 1   Worry too much - different things 3 3 3 1   Trouble relaxing 3 3 3 1   Restless 3 3 0 0  Easily annoyed or irritable 2 2 3 1   Afraid - awful might happen 3 3 3 1   Total GAD 7 Score 20 20 18 6   Anxiety Difficulty Very difficult Very difficult Very difficult     Past Medical History Past Medical History:  Diagnosis Date   ADD (attention deficit disorder)    Anxiety    Depression    Hypertension     Vital signs: There were no vitals filed for this visit.  Allergies:  Allergies as of 10/02/2022   (No Known Allergies)    Medication History Current medications:  Outpatient Encounter Medications as of 10/02/2022  Medication Sig   escitalopram (LEXAPRO) 20 MG tablet Take 1 tablet (20 mg total) by mouth daily.   ondansetron (ZOFRAN ODT) 4 MG disintegrating tablet Take 1 tablet (4 mg total) by mouth every 8 (eight) hours as needed for nausea or vomiting.   Vitamin D, Ergocalciferol, (DRISDOL) 1.25 MG (50000 UNIT) CAPS capsule Take 1 capsule (50,000 Units total) by mouth every 7 (seven) days.   No facility-administered encounter medications on file as of 10/02/2022.     Scribe for Treatment Team: Reuel Boom

## 2022-10-22 ENCOUNTER — Ambulatory Visit: Payer: Medicaid Other | Admitting: Family Medicine

## 2022-12-16 ENCOUNTER — Ambulatory Visit (INDEPENDENT_AMBULATORY_CARE_PROVIDER_SITE_OTHER): Payer: Medicaid Other | Admitting: Family Medicine

## 2022-12-16 ENCOUNTER — Encounter: Payer: Self-pay | Admitting: Family Medicine

## 2022-12-16 VITALS — BP 134/75 | HR 85 | Ht 64.5 in | Wt 191.0 lb

## 2022-12-16 DIAGNOSIS — R7301 Impaired fasting glucose: Secondary | ICD-10-CM

## 2022-12-16 DIAGNOSIS — E559 Vitamin D deficiency, unspecified: Secondary | ICD-10-CM

## 2022-12-16 DIAGNOSIS — R110 Nausea: Secondary | ICD-10-CM | POA: Diagnosis not present

## 2022-12-16 DIAGNOSIS — M545 Low back pain, unspecified: Secondary | ICD-10-CM

## 2022-12-16 DIAGNOSIS — F419 Anxiety disorder, unspecified: Secondary | ICD-10-CM | POA: Diagnosis not present

## 2022-12-16 DIAGNOSIS — E038 Other specified hypothyroidism: Secondary | ICD-10-CM

## 2022-12-16 DIAGNOSIS — E7849 Other hyperlipidemia: Secondary | ICD-10-CM

## 2022-12-16 DIAGNOSIS — F32A Depression, unspecified: Secondary | ICD-10-CM

## 2022-12-16 MED ORDER — VITAMIN D (ERGOCALCIFEROL) 1.25 MG (50000 UNIT) PO CAPS
50000.0000 [IU] | ORAL_CAPSULE | ORAL | 0 refills | Status: DC
Start: 2022-12-16 — End: 2023-05-19

## 2022-12-16 MED ORDER — ESCITALOPRAM OXALATE 20 MG PO TABS
20.0000 mg | ORAL_TABLET | Freq: Every day | ORAL | 1 refills | Status: AC
Start: 2022-12-16 — End: ?

## 2022-12-16 MED ORDER — CYCLOBENZAPRINE HCL 5 MG PO TABS
5.0000 mg | ORAL_TABLET | Freq: Every day | ORAL | 1 refills | Status: DC
Start: 2022-12-16 — End: 2023-09-15

## 2022-12-16 MED ORDER — ONDANSETRON 4 MG PO TBDP: 4.0000 mg | ORAL_TABLET | Freq: Three times a day (TID) | ORAL | 1 refills | Status: DC | PRN

## 2022-12-16 NOTE — Progress Notes (Signed)
Established Patient Office Visit  Subjective:  Patient ID: Jillian Mcpherson, female    DOB: 09/30/1990  Age: 32 y.o. MRN: [id number]  CC:  Chief Complaint  Patient presents with   Care Management    F/u for check up.   Back Pain    Pt reports back pain that started years ago from when she was in high school, reports feeling of tightness and cramping feeling on her low back.     HPI Jillian Mcpherson is a 32 y.o. female with past medical history of anxiety and depression, chronic low back pain, evaluated patient presents for f/u of  chronic medical conditions. For the details of today's visit, please refer to the assessment and plan.       Past Medical History:  Diagnosis Date   ADD (attention deficit disorder)    Anxiety    Depression    Hypertension     Past Surgical History:  Procedure Laterality Date   CESAREAN SECTION N/A 11/18/2013   Procedure: CESAREAN SECTION;  Surgeon: Lazaro Arms, MD;  Location: [facility];  Service: Obstetrics;  Laterality: N/A;   CHOLECYSTECTOMY N/A 01/03/2014   Procedure: LAPAROSCOPIC CHOLECYSTECTOMY;  Surgeon: Dalia Heading, MD;  Location: [facility];  Service: General;  Laterality: N/A;   TONSILLECTOMY      Family History  Problem Relation Age of Onset   Heart disease Mother    Hypertension Father    Asthma Father    Heart disease Maternal Grandmother    COPD Paternal Grandmother    Cancer Other        great grandmother    Social History   Socioeconomic History   Marital status: Married    Spouse name: Not on file   Number of children: 1   Years of education: Not on file   Highest education level: Not on file  Occupational History   Not on file  Tobacco Use   Smoking status: Former    Current packs/day: 0.00    Average packs/day: 1 pack/day for 3.0 years (3.0 ttl pk-yrs)    Types: Cigarettes    Start date: 01/01/2008    Quit date: 01/01/2011    Years since quitting: 11.9   Smokeless tobacco: Never  Vaping Use   Vaping status: Never  Used  Substance and Sexual Activity   Alcohol use: Yes   Drug use: No   Sexual activity: Yes    Birth control/protection: None  Other Topics Concern   Not on file  Social History Narrative   Not on file   Social Determinants of Health   Financial Resource Strain: Not on file  Food Insecurity: No Food Insecurity (10/21/2019)   Hunger Vital Sign    Worried About Running Out of Food in the Last Year: Never true    Ran Out of Food in the Last Year: Never true  Transportation Needs: Not on file  Physical Activity: Not on file  Stress: Not on file  Social Connections: Not on file  Intimate Partner Violence: Not At Risk (10/21/2019)   Humiliation, Afraid, Rape, and Kick questionnaire    Fear of Current or Ex-Partner: No    Emotionally Abused: No    Physically Abused: No    Sexually Abused: No    Outpatient Medications Prior to Visit  Medication Sig Dispense Refill   escitalopram (LEXAPRO) 20 MG tablet Take 1 tablet (20 mg total) by mouth daily. 30 tablet 3   ondansetron (ZOFRAN ODT) 4 MG disintegrating  tablet Take 1 tablet (4 mg total) by mouth every 8 (eight) hours as needed for nausea or vomiting. 10 tablet 0   Vitamin D, Ergocalciferol, (DRISDOL) 1.25 MG (50000 UNIT) CAPS capsule Take 1 capsule (50,000 Units total) by mouth every 7 (seven) days. 10 capsule 1   No facility-administered medications prior to visit.    No Known Allergies  ROS Review of Systems  Constitutional:  Negative for chills and fever.  Eyes:  Negative for visual disturbance.  Respiratory:  Negative for chest tightness and shortness of breath.   Musculoskeletal:  Positive for back pain.  Neurological:  Negative for dizziness and headaches.      Objective:    Physical Exam HENT:     Head: Normocephalic.     Mouth/Throat:     Mouth: Mucous membranes are moist.  Cardiovascular:     Rate and Rhythm: Normal rate.     Heart sounds: Normal heart sounds.  Pulmonary:     Effort: Pulmonary effort is  normal.     Breath sounds: Normal breath sounds.  Neurological:     Mental Status: She is alert.     BP 134/75   Pulse 85   Ht 5' 4.5" (1.638 m)   Wt 191 lb 0.6 oz (86.7 kg)   SpO2 96%   BMI 32.29 kg/m  Wt Readings from Last 3 Encounters:  12/16/22 191 lb 0.6 oz (86.7 kg)  08/15/22 188 lb 1.9 oz (85.3 kg)  01/30/22 198 lb 1.9 oz (89.9 kg)    Lab Results  Component Value Date   TSH 4.610 (H) 08/15/2022   Lab Results  Component Value Date   WBC 12.3 (H) 08/15/2022   HGB 12.1 08/15/2022   HCT 38.1 08/15/2022   MCV 82 08/15/2022   PLT 440 08/15/2022   Lab Results  Component Value Date   NA 140 08/15/2022   K 3.7 08/15/2022   CO2 22 08/15/2022   GLUCOSE 97 08/15/2022   BUN 6 08/15/2022   CREATININE 0.67 08/15/2022   BILITOT 0.3 08/15/2022   ALKPHOS 72 08/15/2022   AST 10 08/15/2022   ALT 8 08/15/2022   PROT 6.7 08/15/2022   ALBUMIN 3.9 08/15/2022   CALCIUM 9.3 08/15/2022   ANIONGAP 10 08/23/2020   EGFR 118 08/15/2022   Lab Results  Component Value Date   CHOL 182 08/15/2022   Lab Results  Component Value Date   HDL 41 08/15/2022   Lab Results  Component Value Date   LDLCALC 113 (H) 08/15/2022   Lab Results  Component Value Date   TRIG 156 (H) 08/15/2022   Lab Results  Component Value Date   CHOLHDL 4.4 08/15/2022   Lab Results  Component Value Date   HGBA1C 5.3 08/15/2022      Assessment & Plan:  Lumbar back pain Assessment & Plan: No red flag symptoms reported Complaints of tightness in the lumbar spine Referral placed to physical therapy Refill Flexeril 5 mg nightly Encouraged to perform low back exercises Encouraged rest, heat or cold application, valgus of aggravating activities  Orders: -     DG Lumbar Spine Complete -     Cyclobenzaprine HCl; Take 1 tablet (5 mg total) by mouth at bedtime.  Dispense: 30 tablet; Refill: 1 -     Ambulatory referral to Physical Therapy  Anxiety and depression Assessment & Plan: GAD-7 is  6. Denies suicidal thoughts and ideation Refilled lexapro 20mg  daily Discussed Nonpharmacologic management of anxiety and depression  Mindfulness and Meditation  Practices like mindfulness meditation can help reduce symptoms by promoting relaxation and present-moment awareness.  Exercise  Regular physical activity has been shown to improve mood and reduce anxiety through the release of endorphins and other neurochemicals.  Healthy Diet Eating a balanced diet rich in fruits, vegetables, whole grains, and lean proteins can support overall mental health.  Sleep Hygiene  Establishing a regular sleep routine and ensuring good sleep quality can significantly impact mood and anxiety levels.  Stress Management Techniques Activities such as yoga, tai chi, and deep breathing exercises can help manage stress.  Social Support Maintaining strong relationships and seeking support from friends, family, or support groups can provide emotional comfort and reduce feelings of isolation.  Lifestyle Modifications Reducing alcohol and caffeine intake, quitting smoking, and avoiding recreational drugs can improve symptoms.  Art and Music Therapy Engaging in creative activities like painting, drawing, or playing music can be therapeutic and help express emotions.  Light Therapy Particularly useful for seasonal affective disorder (SAD), exposure to bright light can help regulate mood. .    Orders: -     Escitalopram Oxalate; Take 1 tablet (20 mg total) by mouth daily.  Dispense: 60 tablet; Refill: 1  Nausea -     Ondansetron; Take 1 tablet (4 mg total) by mouth every 8 (eight) hours as needed for nausea or vomiting.  Dispense: 60 tablet; Refill: 1  IFG (impaired fasting glucose) -     Hemoglobin A1c  Vitamin D deficiency -     VITAMIN D 25 Hydroxy (Vit-D Deficiency, Fractures) -     Vitamin D (Ergocalciferol); Take 1 capsule (50,000 Units total) by mouth every 7 (seven) days.  Dispense: 20 capsule; Refill:  0  Other specified hypothyroidism -     TSH + free T4  Other hyperlipidemia -     Lipid panel -     CMP14+EGFR -     CBC with Differential/Platelet  Note: This chart has been completed using Engineer, civil (consulting) software, and while attempts have been made to ensure accuracy, certain words and phrases may not be transcribed as intended.    Follow-up: Return in about 2 weeks (around 12/30/2022) for pap smear.   Gilmore Laroche, FNP

## 2022-12-16 NOTE — Patient Instructions (Addendum)
  I appreciate the opportunity to provide care to you today!    Follow up:  2 weeks for pap smear  Labs: please stop by the lab during the week to get your blood drawn (CBC, CMP, TSH, Lipid profile, HgA1c, Vit D)   Nonpharmacologic management of anxiety   Mindfulness and Meditation Practices like mindfulness meditation can help reduce symptoms by promoting relaxation and present-moment awareness.  Exercise  Regular physical activity has been shown to improve mood and reduce anxiety through the release of endorphins and other neurochemicals.  Healthy Diet Eating a balanced diet rich in fruits, vegetables, whole grains, and lean proteins can support overall mental health.  Sleep Hygiene  Establishing a regular sleep routine and ensuring good sleep quality can significantly impact mood and anxiety levels.  Stress Management Techniques Activities such as yoga, tai chi, and deep breathing exercises can help manage stress.  Social Support Maintaining strong relationships and seeking support from friends, family, or support groups can provide emotional comfort and reduce feelings of isolation.  Lifestyle Modifications Reducing alcohol and caffeine intake, quitting smoking, and avoiding recreational drugs can improve symptoms.  Art and Music Therapy Engaging in creative activities like painting, drawing, or playing music can be therapeutic and help express emotions.  Light Therapy Particularly useful for seasonal affective disorder (SAD), exposure to bright light can help regulate mood. .    Conservative managements for Low Back Pain include Heat therapy --  helps reduce muscle spasm Cold Therapy- helps reduce edema Take OTC: Tylenol 650 every 6 hours     Referrals today- PT  Attached with your AVS, you will find valuable resources for self-education. I highly recommend dedicating some time to thoroughly examine them.   Please continue to a heart-healthy diet and increase your physical  activities. Try to exercise for at least five days a week.    It was a pleasure to see you and I look forward to continuing to work together on your health and well-being. Please do not hesitate to call the office if you need care or have questions about your care.  In case of emergency, please visit the Emergency Department for urgent care, or contact our clinic at [phone] to schedule an appointment. We're here to help you!   Have a wonderful day and week. With Gratitude, Gilmore Laroche MSN, FNP-BC

## 2022-12-20 DIAGNOSIS — M545 Low back pain, unspecified: Secondary | ICD-10-CM | POA: Insufficient documentation

## 2022-12-20 NOTE — Assessment & Plan Note (Signed)
No red flag symptoms reported Complaints of tightness in the lumbar spine Referral placed to physical therapy Refill Flexeril 5 mg nightly Encouraged to perform low back exercises Encouraged rest, heat or cold application, valgus of aggravating activities

## 2022-12-20 NOTE — Assessment & Plan Note (Signed)
GAD-7 is 6. Denies suicidal thoughts and ideation Refilled lexapro 20mg  daily Discussed Nonpharmacologic management of anxiety and depression  Mindfulness and Meditation Practices like mindfulness meditation can help reduce symptoms by promoting relaxation and present-moment awareness.  Exercise  Regular physical activity has been shown to improve mood and reduce anxiety through the release of endorphins and other neurochemicals.  Healthy Diet Eating a balanced diet rich in fruits, vegetables, whole grains, and lean proteins can support overall mental health.  Sleep Hygiene  Establishing a regular sleep routine and ensuring good sleep quality can significantly impact mood and anxiety levels.  Stress Management Techniques Activities such as yoga, tai chi, and deep breathing exercises can help manage stress.  Social Support Maintaining strong relationships and seeking support from friends, family, or support groups can provide emotional comfort and reduce feelings of isolation.  Lifestyle Modifications Reducing alcohol and caffeine intake, quitting smoking, and avoiding recreational drugs can improve symptoms.  Art and Music Therapy Engaging in creative activities like painting, drawing, or playing music can be therapeutic and help express emotions.  Light Therapy Particularly useful for seasonal affective disorder (SAD), exposure to bright light can help regulate mood. Marland Kitchen

## 2022-12-31 ENCOUNTER — Encounter: Payer: Self-pay | Admitting: Family Medicine

## 2022-12-31 ENCOUNTER — Other Ambulatory Visit (HOSPITAL_COMMUNITY)
Admission: RE | Admit: 2022-12-31 | Discharge: 2022-12-31 | Disposition: A | Discharge status: Home / Self Care | Payer: Medicaid Other | Source: Ambulatory Visit | Attending: Family Medicine | Admitting: Family Medicine

## 2022-12-31 ENCOUNTER — Ambulatory Visit: Payer: Medicaid Other | Admitting: Family Medicine

## 2022-12-31 VITALS — BP 130/74 | HR 88 | Ht 64.5 in | Wt 190.1 lb

## 2022-12-31 DIAGNOSIS — F419 Anxiety disorder, unspecified: Secondary | ICD-10-CM | POA: Diagnosis not present

## 2022-12-31 DIAGNOSIS — Z01411 Encounter for gynecological examination (general) (routine) with abnormal findings: Secondary | ICD-10-CM

## 2022-12-31 DIAGNOSIS — Z124 Encounter for screening for malignant neoplasm of cervix: Secondary | ICD-10-CM | POA: Insufficient documentation

## 2022-12-31 DIAGNOSIS — F32A Depression, unspecified: Secondary | ICD-10-CM | POA: Diagnosis not present

## 2022-12-31 NOTE — Patient Instructions (Addendum)
I appreciate the opportunity to provide care to you today!    Follow up:  4 weeks for anxiety  Nonpharmacologic management of anxiety and depression  Mindfulness and Meditation Practices like mindfulness meditation can help reduce symptoms by promoting relaxation and present-moment awareness.  Exercise  Regular physical activity has been shown to improve mood and reduce anxiety through the release of endorphins and other neurochemicals.  Healthy Diet Eating a balanced diet rich in fruits, vegetables, whole grains, and lean proteins can support overall mental health.  Sleep Hygiene  Establishing a regular sleep routine and ensuring good sleep quality can significantly impact mood and anxiety levels.  Stress Management Techniques Activities such as yoga, tai chi, and deep breathing exercises can help manage stress.  Social Support Maintaining strong relationships and seeking support from friends, family, or support groups can provide emotional comfort and reduce feelings of isolation.  Lifestyle Modifications Reducing alcohol and caffeine intake, quitting smoking, and avoiding recreational drugs can improve symptoms.  Art and Music Therapy Engaging in creative activities like painting, drawing, or playing music can be therapeutic and help express emotions.  Light Therapy Particularly useful for seasonal affective disorder (SAD), exposure to bright light can help regulate mood. .    Attached with your AVS, you will find valuable resources for self-education. I highly recommend dedicating some time to thoroughly examine them.   Please continue to a heart-healthy diet and increase your physical activities. Try to exercise for at least five days a week.    It was a pleasure to see you and I look forward to continuing to work together on your health and well-being. Please do not hesitate to call the office if you need care or have questions about your care.  In case of emergency,  please visit the Emergency Department for urgent care, or contact our clinic at [phone] to schedule an appointment. We're here to help you!   Have a wonderful day and week. With Gratitude, Gilmore Laroche MSN, FNP-BC

## 2022-12-31 NOTE — Progress Notes (Signed)
Established Patient Office Visit  Subjective:  Patient ID: Jillian Mcpherson, female    DOB: 09-04-1990  Age: 32 y.o. MRN: [id number]  CC:  Chief Complaint  Patient presents with   Gynecologic Exam    Pap smear today   Anxiety    Pt reports panic attack 2 nights ago.     HPI Jillian Mcpherson is a 32 y.o. female with past medical history of anxiety  presents for a pap smear today. For the details of today's visit, please refer to the assessment and plan.    Past Medical History:  Diagnosis Date   ADD (attention deficit disorder)    Anxiety    Depression    Hypertension     Past Surgical History:  Procedure Laterality Date   CESAREAN SECTION N/A 11/18/2013   Procedure: CESAREAN SECTION;  Surgeon: Lazaro Arms, MD;  Location: [facility];  Service: Obstetrics;  Laterality: N/A;   CHOLECYSTECTOMY N/A 01/03/2014   Procedure: LAPAROSCOPIC CHOLECYSTECTOMY;  Surgeon: Dalia Heading, MD;  Location: [facility];  Service: General;  Laterality: N/A;   TONSILLECTOMY      Family History  Problem Relation Age of Onset   Heart disease Mother    Hypertension Father    Asthma Father    Heart disease Maternal Grandmother    COPD Paternal Grandmother    Cancer Other        great grandmother    Social History   Socioeconomic History   Marital status: Married    Spouse name: Not on file   Number of children: 1   Years of education: Not on file   Highest education level: Not on file  Occupational History   Not on file  Tobacco Use   Smoking status: Former    Current packs/day: 0.00    Average packs/day: 1 pack/day for 3.0 years (3.0 ttl pk-yrs)    Types: Cigarettes    Start date: 01/01/2008    Quit date: 01/01/2011    Years since quitting: 12.0   Smokeless tobacco: Never  Vaping Use   Vaping status: Never Used  Substance and Sexual Activity   Alcohol use: Yes   Drug use: No   Sexual activity: Yes    Birth control/protection: None  Other Topics Concern   Not on file  Social History  Narrative   Not on file   Social Determinants of Health   Financial Resource Strain: Not on file  Food Insecurity: No Food Insecurity (10/21/2019)   Hunger Vital Sign    Worried About Running Out of Food in the Last Year: Never true    Ran Out of Food in the Last Year: Never true  Transportation Needs: Not on file  Physical Activity: Not on file  Stress: Not on file  Social Connections: Not on file  Intimate Partner Violence: Not At Risk (10/21/2019)   Humiliation, Afraid, Rape, and Kick questionnaire    Fear of Current or Ex-Partner: No    Emotionally Abused: No    Physically Abused: No    Sexually Abused: No    Outpatient Medications Prior to Visit  Medication Sig Dispense Refill   cyclobenzaprine (FLEXERIL) 5 MG tablet Take 1 tablet (5 mg total) by mouth at bedtime. 30 tablet 1   escitalopram (LEXAPRO) 20 MG tablet Take 1 tablet (20 mg total) by mouth daily. 60 tablet 1   ondansetron (ZOFRAN ODT) 4 MG disintegrating tablet Take 1 tablet (4 mg total) by mouth every 8 (eight) hours  as needed for nausea or vomiting. 60 tablet 1   Vitamin D, Ergocalciferol, (DRISDOL) 1.25 MG (50000 UNIT) CAPS capsule Take 1 capsule (50,000 Units total) by mouth every 7 (seven) days. 20 capsule 0   No facility-administered medications prior to visit.    No Known Allergies  ROS Review of Systems  Constitutional:  Negative for chills and fever.  Eyes:  Negative for visual disturbance.  Respiratory:  Negative for chest tightness and shortness of breath.   Genitourinary:  Negative for menstrual problem, vaginal bleeding and vaginal discharge.  Neurological:  Negative for dizziness and headaches.      Objective:    Physical Exam HENT:     Head: Normocephalic.     Mouth/Throat:     Mouth: Mucous membranes are moist.  Cardiovascular:     Rate and Rhythm: Normal rate.     Heart sounds: Normal heart sounds.  Pulmonary:     Effort: Pulmonary effort is normal.     Breath sounds: Normal breath  sounds.  Chest:     Chest wall: No mass, deformity or swelling.  Breasts:    Tanner Score is 5.     Right: No inverted nipple, mass or nipple discharge.     Left: No inverted nipple, mass or nipple discharge.     Comments: Breast exam performed Genitourinary:    General: Normal vulva.     Exam position: Lithotomy position.     Comments: Vaginal wall: pink and rugated, smooth and non-tender; absence of lesions, edema, and erythema. Labia Majora and Minora: present bilaterally, moist, soft tissue, and homogeneous; free of edema and ulcerations. Clitoris is anatomically present, above the urethral, and free of lesions, masses, and ulceration.    Neurological:     Mental Status: She is alert.     BP 130/74   Pulse 88   Ht 5' 4.5" (1.638 m)   Wt 190 lb 1.9 oz (86.2 kg)   SpO2 99%   BMI 32.13 kg/m  Wt Readings from Last 3 Encounters:  12/31/22 190 lb 1.9 oz (86.2 kg)  12/16/22 191 lb 0.6 oz (86.7 kg)  08/15/22 188 lb 1.9 oz (85.3 kg)    Lab Results  Component Value Date   TSH 4.610 (H) 08/15/2022   Lab Results  Component Value Date   WBC 12.3 (H) 08/15/2022   HGB 12.1 08/15/2022   HCT 38.1 08/15/2022   MCV 82 08/15/2022   PLT 440 08/15/2022   Lab Results  Component Value Date   NA 140 08/15/2022   K 3.7 08/15/2022   CO2 22 08/15/2022   GLUCOSE 97 08/15/2022   BUN 6 08/15/2022   CREATININE 0.67 08/15/2022   BILITOT 0.3 08/15/2022   ALKPHOS 72 08/15/2022   AST 10 08/15/2022   ALT 8 08/15/2022   PROT 6.7 08/15/2022   ALBUMIN 3.9 08/15/2022   CALCIUM 9.3 08/15/2022   ANIONGAP 10 08/23/2020   EGFR 118 08/15/2022   Lab Results  Component Value Date   CHOL 182 08/15/2022   Lab Results  Component Value Date   HDL 41 08/15/2022   Lab Results  Component Value Date   LDLCALC 113 (H) 08/15/2022   Lab Results  Component Value Date   TRIG 156 (H) 08/15/2022   Lab Results  Component Value Date   CHOLHDL 4.4 08/15/2022   Lab Results  Component Value Date    HGBA1C 5.3 08/15/2022      Assessment & Plan:  Cervical cancer screening Assessment & Plan: -  Cytology and HPV co-testing (preferred) every 5 years or cytology alone (acceptable) every 3 years. - Pap due 2029    Orders: -     Cytology - PAP  Anxiety and depression Assessment & Plan: GAD- 7 is 12 Reports one occurrence of a panic attack since starting therapy Has been taking lexapro 20 mg daily Will reevaluate and make changes to therapy in 4 weeks Reviewed nonpharmacologic intervention for anxiety/ panic attacks' Encouraged to continue CBT No reports of suicidal thoughts and ideation    Note: This chart has been completed using Engineer, civil (consulting) software, and while attempts have been made to ensure accuracy, certain words and phrases may not be transcribed as intended.    Follow-up: Return in about 4 weeks (around 01/28/2023) for anxiety and depression.   Gilmore Laroche, FNP

## 2023-01-03 DIAGNOSIS — Z124 Encounter for screening for malignant neoplasm of cervix: Secondary | ICD-10-CM | POA: Insufficient documentation

## 2023-01-03 NOTE — Assessment & Plan Note (Signed)
Cytology and HPV co-testing (preferred) every 5 years or cytology alone (acceptable) every 3 years. - Pap due 2029   

## 2023-01-03 NOTE — Assessment & Plan Note (Signed)
GAD- 7 is 12 Reports one occurrence of a panic attack since starting therapy Has been taking lexapro 20 mg daily Will reevaluate and make changes to therapy in 4 weeks Reviewed nonpharmacologic intervention for anxiety/ panic attacks' Encouraged to continue CBT No reports of suicidal thoughts and ideation

## 2023-01-08 ENCOUNTER — Ambulatory Visit: Payer: Medicaid Other | Admitting: Family Medicine

## 2023-01-31 ENCOUNTER — Ambulatory Visit: Payer: Medicaid Other | Admitting: Family Medicine

## 2023-02-17 ENCOUNTER — Encounter: Payer: Self-pay | Admitting: Family Medicine

## 2023-02-17 ENCOUNTER — Ambulatory Visit: Payer: Medicaid Other | Admitting: Family Medicine

## 2023-02-17 DIAGNOSIS — F419 Anxiety disorder, unspecified: Secondary | ICD-10-CM

## 2023-02-17 DIAGNOSIS — F32A Depression, unspecified: Secondary | ICD-10-CM

## 2023-02-17 MED ORDER — ESCITALOPRAM OXALATE 20 MG PO TABS
20.0000 mg | ORAL_TABLET | Freq: Every day | ORAL | 2 refills | Status: DC
Start: 2023-02-17 — End: 2023-05-19

## 2023-02-17 NOTE — Patient Instructions (Addendum)
I appreciate the opportunity to provide care to you today!    Follow up:  3 months    Attached with your AVS, you will find valuable resources for self-education. I highly recommend dedicating some time to thoroughly examine them.   Please continue to a heart-healthy diet and increase your physical activities. Try to exercise for at least five days a week.    It was a pleasure to see you and I look forward to continuing to work together on your health and well-being. Please do not hesitate to call the office if you need care or have questions about your care.  In case of emergency, please visit the Emergency Department for urgent care, or contact our clinic at [phone] to schedule an appointment. We're here to help you!   Have a wonderful day and week. With Gratitude, Gilmore Laroche MSN, FNP-BC

## 2023-02-17 NOTE — Assessment & Plan Note (Addendum)
The patient is requesting a refill of Lexapro 20 mg daily. She reports that she has been out of her medication for about 3 days. She denies experiencing any symptoms such as palpitations, headaches, nausea, vomiting, diarrhea, or stomach cramps. A refill will be processed to ensure she has access to her medication and can continue her treatment without interruption.

## 2023-02-17 NOTE — Progress Notes (Signed)
Established Patient Office Visit  Subjective:  Patient ID: Jillian Mcpherson, female    DOB: 1991-02-07  Age: 32 y.o. MRN: [id number]  CC:  Chief Complaint  Patient presents with   Anxiety    HP Rebbecca R Garver is a 32 y.o. female with past medical history of  presents for f/u of  chronic medical conditions. For the details of today's visit, please refer to the assessment and plan.     Past Medical History:  Diagnosis Date   ADD (attention deficit disorder)    Anxiety    Depression    Hypertension     Past Surgical History:  Procedure Laterality Date   CESAREAN SECTION N/A 11/18/2013   Procedure: CESAREAN SECTION;  Surgeon: Lazaro Arms, MD;  Location: [facility];  Service: Obstetrics;  Laterality: N/A;   CHOLECYSTECTOMY N/A 01/03/2014   Procedure: LAPAROSCOPIC CHOLECYSTECTOMY;  Surgeon: Dalia Heading, MD;  Location: [facility];  Service: General;  Laterality: N/A;   TONSILLECTOMY      Family History  Problem Relation Age of Onset   Heart disease Mother    Hypertension Father    Asthma Father    Heart disease Maternal Grandmother    COPD Paternal Grandmother    Cancer Other        great grandmother    Social History   Socioeconomic History   Marital status: Married    Spouse name: Not on file   Number of children: 1   Years of education: Not on file   Highest education level: Not on file  Occupational History   Not on file  Tobacco Use   Smoking status: Former    Current packs/day: 0.00    Average packs/day: 1 pack/day for 3.0 years (3.0 ttl pk-yrs)    Types: Cigarettes    Start date: 01/01/2008    Quit date: 01/01/2011    Years since quitting: 12.1   Smokeless tobacco: Never  Vaping Use   Vaping status: Never Used  Substance and Sexual Activity   Alcohol use: Yes   Drug use: No   Sexual activity: Yes    Birth control/protection: None  Other Topics Concern   Not on file  Social History Narrative   Not on file   Social Determinants of Health   Financial  Resource Strain: Not on file  Food Insecurity: No Food Insecurity (10/21/2019)   Hunger Vital Sign    Worried About Running Out of Food in the Last Year: Never true    Ran Out of Food in the Last Year: Never true  Transportation Needs: Not on file  Physical Activity: Not on file  Stress: Not on file  Social Connections: Not on file  Intimate Partner Violence: Not At Risk (10/21/2019)   Humiliation, Afraid, Rape, and Kick questionnaire    Fear of Current or Ex-Partner: No    Emotionally Abused: No    Physically Abused: No    Sexually Abused: No    Outpatient Medications Prior to Visit  Medication Sig Dispense Refill   cyclobenzaprine (FLEXERIL) 5 MG tablet Take 1 tablet (5 mg total) by mouth at bedtime. 30 tablet 1   ondansetron (ZOFRAN ODT) 4 MG disintegrating tablet Take 1 tablet (4 mg total) by mouth every 8 (eight) hours as needed for nausea or vomiting. 60 tablet 1   Vitamin D, Ergocalciferol, (DRISDOL) 1.25 MG (50000 UNIT) CAPS capsule Take 1 capsule (50,000 Units total) by mouth every 7 (seven) days. 20 capsule 0  escitalopram (LEXAPRO) 20 MG tablet Take 1 tablet (20 mg total) by mouth daily. 60 tablet 1   No facility-administered medications prior to visit.    No Known Allergies  ROS Review of Systems  Constitutional:  Negative for chills and fever.  Eyes:  Negative for visual disturbance.  Respiratory:  Negative for chest tightness and shortness of breath.   Neurological:  Negative for dizziness and headaches.      Objective:    Physical Exam HENT:     Head: Normocephalic.     Mouth/Throat:     Mouth: Mucous membranes are moist.  Cardiovascular:     Rate and Rhythm: Normal rate.     Heart sounds: Normal heart sounds.  Pulmonary:     Effort: Pulmonary effort is normal.     Breath sounds: Normal breath sounds.  Neurological:     Mental Status: She is alert.     BP 121/82   Pulse 84   Ht 5' 4.5" (1.638 m)   Wt 197 lb 1.9 oz (89.4 kg)   SpO2 97%   BMI  33.31 kg/m  Wt Readings from Last 3 Encounters:  02/17/23 197 lb 1.9 oz (89.4 kg)  12/31/22 190 lb 1.9 oz (86.2 kg)  12/16/22 191 lb 0.6 oz (86.7 kg)    Lab Results  Component Value Date   TSH 4.610 (H) 08/15/2022   Lab Results  Component Value Date   WBC 12.3 (H) 08/15/2022   HGB 12.1 08/15/2022   HCT 38.1 08/15/2022   MCV 82 08/15/2022   PLT 440 08/15/2022   Lab Results  Component Value Date   NA 140 08/15/2022   K 3.7 08/15/2022   CO2 22 08/15/2022   GLUCOSE 97 08/15/2022   BUN 6 08/15/2022   CREATININE 0.67 08/15/2022   BILITOT 0.3 08/15/2022   ALKPHOS 72 08/15/2022   AST 10 08/15/2022   ALT 8 08/15/2022   PROT 6.7 08/15/2022   ALBUMIN 3.9 08/15/2022   CALCIUM 9.3 08/15/2022   ANIONGAP 10 08/23/2020   EGFR 118 08/15/2022   Lab Results  Component Value Date   CHOL 182 08/15/2022   Lab Results  Component Value Date   HDL 41 08/15/2022   Lab Results  Component Value Date   LDLCALC 113 (H) 08/15/2022   Lab Results  Component Value Date   TRIG 156 (H) 08/15/2022   Lab Results  Component Value Date   CHOLHDL 4.4 08/15/2022   Lab Results  Component Value Date   HGBA1C 5.3 08/15/2022      Assessment & Plan:  Anxiety and depression Assessment & Plan: The patient is requesting a refill of Lexapro 20 mg daily. She reports that she has been out of her medication for about 3 days. She denies experiencing any symptoms such as palpitations, headaches, nausea, vomiting, diarrhea, or stomach cramps. A refill will be processed to ensure she has access to her medication and can continue her treatment without interruption.  Orders: -     Escitalopram Oxalate; Take 1 tablet (20 mg total) by mouth daily.  Dispense: 60 tablet; Refill: 2   Note: This chart has been completed using Engineer, civil (consulting) software, and while attempts have been made to ensure accuracy, certain words and phrases may not be transcribed as intended.   Follow-up: Return in about 3  months (around 05/19/2023).   Gilmore Laroche, FNP

## 2023-02-28 ENCOUNTER — Ambulatory Visit: Payer: Medicaid Other | Admitting: Family Medicine

## 2023-05-19 ENCOUNTER — Encounter: Payer: Self-pay | Admitting: Family Medicine

## 2023-05-19 ENCOUNTER — Ambulatory Visit: Payer: Medicaid Other | Admitting: Family Medicine

## 2023-05-19 VITALS — BP 128/68 | HR 91 | Ht 64.0 in | Wt 198.1 lb

## 2023-05-19 DIAGNOSIS — F419 Anxiety disorder, unspecified: Secondary | ICD-10-CM

## 2023-05-19 DIAGNOSIS — Z720 Tobacco use: Secondary | ICD-10-CM | POA: Diagnosis not present

## 2023-05-19 DIAGNOSIS — I10 Essential (primary) hypertension: Secondary | ICD-10-CM

## 2023-05-19 DIAGNOSIS — F32A Depression, unspecified: Secondary | ICD-10-CM | POA: Diagnosis not present

## 2023-05-19 DIAGNOSIS — Z716 Tobacco abuse counseling: Secondary | ICD-10-CM

## 2023-05-19 MED ORDER — VARENICLINE TARTRATE (STARTER) 0.5 MG X 11 & 1 MG X 42 PO TBPK
ORAL_TABLET | ORAL | 0 refills | Status: DC
Start: 2023-05-19 — End: 2023-09-15

## 2023-05-19 MED ORDER — ESCITALOPRAM OXALATE 20 MG PO TABS
20.0000 mg | ORAL_TABLET | Freq: Every day | ORAL | 2 refills | Status: DC
Start: 2023-05-19 — End: 2023-09-15

## 2023-05-19 NOTE — Patient Instructions (Addendum)
I appreciate the opportunity to provide care to you today!    Follow up:  3 months  Labs: please stop by the lab during the week to get your blood drawn (CBC, CMP, TSH, Lipid profile, HgA1c, Vit D)  Smoking cessation:  The prescription for the Chantix starter pack has been sent to your pharmacy. Please pick it up and follow the instructions. Please request a refill for the maintenance treatment.   Attached with your AVS, you will find valuable resources for self-education. I highly recommend dedicating some time to thoroughly examine them.   Please continue to a heart-healthy diet and increase your physical activities. Try to exercise for at least five days a week.    It was a pleasure to see you and I look forward to continuing to work together on your health and well-being. Please do not hesitate to call the office if you need care or have questions about your care.  In case of emergency, please visit the Emergency Department for urgent care, or contact our clinic at [phone] to schedule an appointment. We're here to help you!   Have a wonderful day and week. With Gratitude, Gilmore Laroche MSN, FNP-BC

## 2023-05-20 DIAGNOSIS — Z720 Tobacco use: Secondary | ICD-10-CM | POA: Insufficient documentation

## 2023-05-20 NOTE — Assessment & Plan Note (Signed)
 The patient takes Lexapro 20 mg daily and reports treatment compliance. She denies any suicidal thoughts or ideation and has requested a refill. The refill has been sent to the pharmacy.

## 2023-05-20 NOTE — Assessment & Plan Note (Signed)
 The patient's blood pressure is well-controlled without medication. She is encouraged to continue implementing lifestyle modifications, including a low-sodium diet and increased physical activity. The patient verbalized understanding and is aware of the plan of care. BP Readings from Last 3 Encounters:  05/19/23 128/68  02/17/23 121/82  12/31/22 130/74

## 2023-05-20 NOTE — Assessment & Plan Note (Signed)
 The patient reports vaping daily and has expressed a desire to quit. Therapy will be initiated with the Chantix  starter pack, and the patient is encouraged to request a refill for maintenance therapy as needed. The goal of therapy is 12 weeks, with the option to extend if requested by the patient. Common adverse effects of Chantix , including constipation, vivid dreams or nightmares, nausea, and upset stomach, were reviewed. The patient verbalized understanding and is aware of the plan of care.

## 2023-08-19 ENCOUNTER — Ambulatory Visit: Payer: Medicaid Other | Admitting: Family Medicine

## 2023-09-15 ENCOUNTER — Encounter: Payer: Self-pay | Admitting: Family Medicine

## 2023-09-15 ENCOUNTER — Ambulatory Visit (INDEPENDENT_AMBULATORY_CARE_PROVIDER_SITE_OTHER): Payer: Self-pay | Admitting: Family Medicine

## 2023-09-15 VITALS — BP 128/85 | HR 81 | Resp 16 | Ht 64.0 in | Wt 206.0 lb

## 2023-09-15 DIAGNOSIS — F419 Anxiety disorder, unspecified: Secondary | ICD-10-CM

## 2023-09-15 DIAGNOSIS — E7849 Other hyperlipidemia: Secondary | ICD-10-CM

## 2023-09-15 DIAGNOSIS — I10 Essential (primary) hypertension: Secondary | ICD-10-CM | POA: Diagnosis not present

## 2023-09-15 DIAGNOSIS — R7301 Impaired fasting glucose: Secondary | ICD-10-CM

## 2023-09-15 DIAGNOSIS — F1290 Cannabis use, unspecified, uncomplicated: Secondary | ICD-10-CM

## 2023-09-15 DIAGNOSIS — Z720 Tobacco use: Secondary | ICD-10-CM | POA: Diagnosis not present

## 2023-09-15 DIAGNOSIS — E038 Other specified hypothyroidism: Secondary | ICD-10-CM

## 2023-09-15 DIAGNOSIS — E559 Vitamin D deficiency, unspecified: Secondary | ICD-10-CM

## 2023-09-15 DIAGNOSIS — F32A Depression, unspecified: Secondary | ICD-10-CM

## 2023-09-15 MED ORDER — SERTRALINE HCL 25 MG PO TABS
25.0000 mg | ORAL_TABLET | Freq: Every day | ORAL | 3 refills | Status: DC
Start: 2023-09-15 — End: 2023-11-03

## 2023-09-15 NOTE — Patient Instructions (Addendum)
 I appreciate the opportunity to provide care to you today!    Follow up:  4 months  Labs: please stop by the lab during the week to get your blood drawn (CBC, CMP, TSH, Lipid profile, HgA1c, Vit D)  Anxiety: Given that you are trying to conceive, Zoloft (sertraline) is considered safer during pregnancy. Therefore, Lexapro  (escitalopram ) has been discontinued, and you have been started on Zoloft 25 mg daily.  Referrals: A [facility] placed [facility] for behavioral therapy to support cessation of marijuana use.   Please continue to a heart-healthy diet and increase your physical activities. Try to exercise for at least five days a week.    It was a pleasure to see you and I look forward to continuing to work together on your health and well-being. Please do not hesitate to call the office if you need care or have questions about your care.  In case of emergency, please visit the Emergency Department for urgent care, or contact our clinic at [phone] to schedule an appointment. We're here to help you!   Have a wonderful day and week. With Gratitude, Armeda Plumb MSN, FNP-BC

## 2023-09-15 NOTE — Progress Notes (Addendum)
 Established Patient Office Visit  Subjective:  Patient ID: Jillian Mcpherson, female    DOB: Nov 29, 1990  Age: 33 y.o. MRN: [id number]  CC:  Chief Complaint  Patient presents with   Follow-up    States she stopped the chantix  because it was giving her nightmares but she quit vaping as of 05/20/23 but she still smokes marijuana and wants some help with how to stop that     HPI Jillian Mcpherson is a 33 y.o. female with past medical history of Hypertension and tobacco use presents for f/u of  chronic medical conditions. For the details of today's visit, please refer to the assessment and plan.     Past Medical History:  Diagnosis Date   ADD (attention deficit disorder)    Anxiety    Depression    Hypertension     Past Surgical History:  Procedure Laterality Date   CESAREAN SECTION N/A 11/18/2013   Procedure: CESAREAN SECTION;  Surgeon: Vonn VEAR Inch, MD;  Location: [facility];  Service: Obstetrics;  Laterality: N/A;   CHOLECYSTECTOMY N/A 01/03/2014   Procedure: LAPAROSCOPIC CHOLECYSTECTOMY;  Surgeon: Oneil DELENA Budge, MD;  Location: [facility];  Service: General;  Laterality: N/A;   TONSILLECTOMY      Family History  Problem Relation Age of Onset   Heart disease Mother    Hypertension Father    Asthma Father    Heart disease Maternal Grandmother    COPD Paternal Grandmother    Cancer Other        great grandmother    Social History   Socioeconomic History   Marital status: Married    Spouse name: Not on file   Number of children: 1   Years of education: Not on file   Highest education level: Not on file  Occupational History   Not on file  Tobacco Use   Smoking status: Former    Current packs/day: 0.00    Average packs/day: 1 pack/day for 3.0 years (3.0 ttl pk-yrs)    Types: Cigarettes    Start date: 01/01/2008    Quit date: 01/01/2011    Years since quitting: 12.8   Smokeless tobacco: Never  Vaping Use   Vaping status: Former   Quit date: 05/20/2023  Substance and Sexual  Activity   Alcohol use: Yes   Drug use: No   Sexual activity: Yes    Birth control/protection: None  Other Topics Concern   Not on file  Social History Narrative   Not on file   Social Drivers of Health   Financial Resource Strain: Medium Risk (10/07/2023)   Overall Financial Resource Strain (CARDIA)    Difficulty of Paying Living Expenses: Somewhat hard  Food Insecurity: No Food Insecurity (10/07/2023)   Hunger Vital Sign    Worried About Running Out of Food in the Last Year: Never true    Ran Out of Food in the Last Year: Never true  Transportation Needs: No Transportation Needs (10/07/2023)   PRAPARE - Administrator, Civil Service (Medical): No    Lack of Transportation (Non-Medical): No  Physical Activity: Inactive (10/07/2023)   Exercise Vital Sign    Days of Exercise per Week: 0 days    Minutes of Exercise per Session: 0 min  Stress: Stress Concern Present (10/07/2023)   [employer] of Occupational Health - Occupational Stress Questionnaire    Feeling of Stress : Rather much  Social Connections: Moderately Isolated (10/07/2023)   Social Connection and Isolation Panel  Frequency of Communication with Friends and Family: Three times a week    Frequency of Social Gatherings with Friends and Family: Three times a week    Attends Religious Services: Never    Active Member of Clubs or Organizations: No    Attends Banker Meetings: Never    Marital Status: Married  Catering manager Violence: Not At Risk (10/07/2023)   Humiliation, Afraid, Rape, and Kick questionnaire    Fear of Current or Ex-Partner: No    Emotionally Abused: No    Physically Abused: No    Sexually Abused: No    Outpatient Medications Prior to Visit  Medication Sig Dispense Refill   ondansetron  (ZOFRAN  ODT) 4 MG disintegrating tablet Take 1 tablet (4 mg total) by mouth every 8 (eight) hours as needed for nausea or vomiting. (Patient not taking: Reported on 10/07/2023) 60 tablet  1   escitalopram  (LEXAPRO ) 20 MG tablet Take 1 tablet (20 mg total) by mouth daily. 60 tablet 2   cyclobenzaprine  (FLEXERIL ) 5 MG tablet Take 1 tablet (5 mg total) by mouth at bedtime. (Patient not taking: Reported on 09/15/2023) 30 tablet 1   Varenicline  Tartrate, Starter, (CHANTIX  STARTING MONTH PAK) 0.5 MG X 11 & 1 MG X 42 TBPK Follow the instructions on package label 53 each 0   No facility-administered medications prior to visit.    No Known Allergies  ROS Review of Systems  Constitutional:  Negative for chills and fever.  Eyes:  Negative for visual disturbance.  Respiratory:  Negative for chest tightness and shortness of breath.   Neurological:  Negative for dizziness and headaches.      Objective:    Physical Exam HENT:     Head: Normocephalic.     Mouth/Throat:     Mouth: Mucous membranes are moist.   Cardiovascular:     Rate and Rhythm: Normal rate.     Heart sounds: Normal heart sounds.  Pulmonary:     Effort: Pulmonary effort is normal.     Breath sounds: Normal breath sounds.   Neurological:     Mental Status: She is alert.     BP 128/85   Pulse 81   Resp 16   Ht 5' 4 (1.626 m)   Wt 206 lb (93.4 kg)   SpO2 98%   BMI 35.36 kg/m  Wt Readings from Last 3 Encounters:  10/07/23 203 lb 6.4 oz (92.3 kg)  09/15/23 206 lb (93.4 kg)  05/19/23 198 lb 1.3 oz (89.8 kg)    Lab Results  Component Value Date   TSH 4.610 (H) 08/15/2022   Lab Results  Component Value Date   WBC 12.3 (H) 08/15/2022   HGB 12.1 08/15/2022   HCT 38.1 08/15/2022   MCV 82 08/15/2022   PLT 440 08/15/2022   Lab Results  Component Value Date   NA 140 08/15/2022   K 3.7 08/15/2022   CO2 22 08/15/2022   GLUCOSE 97 08/15/2022   BUN 6 08/15/2022   CREATININE 0.67 08/15/2022   BILITOT 0.3 08/15/2022   ALKPHOS 72 08/15/2022   AST 10 08/15/2022   ALT 8 08/15/2022   PROT 6.7 08/15/2022   ALBUMIN 3.9 08/15/2022   CALCIUM 9.3 08/15/2022   ANIONGAP 10 08/23/2020   EGFR 118  08/15/2022   Lab Results  Component Value Date   CHOL 182 08/15/2022   Lab Results  Component Value Date   HDL 41 08/15/2022   Lab Results  Component Value Date   LDLCALC 113 (  H) 08/15/2022   Lab Results  Component Value Date   TRIG 156 (H) 08/15/2022   Lab Results  Component Value Date   CHOLHDL 4.4 08/15/2022   Lab Results  Component Value Date   HGBA1C 5.3 08/15/2022      Assessment & Plan:  Primary hypertension Assessment & Plan: The patient's blood pressure is well-controlled without medication. She is encouraged to continue implementing lifestyle modifications, including a low-sodium diet and increased physical activity. The patient verbalized understanding and is aware of the plan of care. BP Readings from Last 3 Encounters:  09/15/23 128/85  05/19/23 128/68  02/17/23 121/82      Marijuana use Assessment & Plan: A referral has been placed to behavioral therapy for cognitive behavioral therapy (CBT) to support smoking cessation, specifically for marijuana use.      Orders: -     Amb ref to Integrated Behavioral Health  Tobacco use Assessment & Plan: Congratulated the patient on her progress with smoking cessation and encouraged continued efforts toward maintaining sobriety.      Anxiety and depression Assessment & Plan: The patient reports that she is trying to conceive. Given this, Zoloft  (sertraline ) is considered safer during pregnancy. Therefore, Lexapro  (escitalopram ) has been discontinued, and the patient has been started on Zoloft  25 mg daily.  Patient and/or legal guardian verbally consented to [facility] services about presenting concerns and psychiatric consultation as appropriate.  The services will be billed as appropriate for the patient   Orders: -     Sertraline  HCl; Take 1 tablet (25 mg total) by mouth daily.  Dispense: 30 tablet; Refill: 3  IFG (impaired fasting glucose) -     Hemoglobin  A1c  Vitamin D  deficiency -     VITAMIN D  25 Hydroxy (Vit-D Deficiency, Fractures)  TSH (thyroid-stimulating hormone deficiency) -     TSH + free T4  Other hyperlipidemia -     Lipid panel -     CMP14+EGFR -     CBC with Differential/Platelet  Note: This chart has been completed using Engineer, civil (consulting) software, and while attempts have been made to ensure accuracy, certain words and phrases may not be transcribed as intended.    Follow-up: Return in about 4 months (around 01/15/2024).   Rica Heather, FNP

## 2023-09-16 DIAGNOSIS — F1290 Cannabis use, unspecified, uncomplicated: Secondary | ICD-10-CM | POA: Insufficient documentation

## 2023-09-16 NOTE — Assessment & Plan Note (Signed)
 Congratulated the patient on her progress with smoking cessation and encouraged continued efforts toward maintaining sobriety.

## 2023-09-16 NOTE — Assessment & Plan Note (Signed)
 The patient's blood pressure is well-controlled without medication. She is encouraged to continue implementing lifestyle modifications, including a low-sodium diet and increased physical activity. The patient verbalized understanding and is aware of the plan of care. BP Readings from Last 3 Encounters:  09/15/23 128/85  05/19/23 128/68  02/17/23 121/82

## 2023-09-16 NOTE — Assessment & Plan Note (Addendum)
 The patient reports that she is trying to conceive. Given this, Zoloft  (sertraline ) is considered safer during pregnancy. Therefore, Lexapro  (escitalopram ) has been discontinued, and the patient has been started on Zoloft  25 mg daily.  Patient and/or legal guardian verbally consented to [facility] services about presenting concerns and psychiatric consultation as appropriate.  The services will be billed as appropriate for the patient

## 2023-09-16 NOTE — Assessment & Plan Note (Signed)
 A referral has been placed to behavioral therapy for cognitive behavioral therapy (CBT) to support smoking cessation, specifically for marijuana use.

## 2023-09-30 ENCOUNTER — Encounter: Admitting: Women's Health

## 2023-10-07 ENCOUNTER — Encounter: Payer: Self-pay | Admitting: Obstetrics and Gynecology

## 2023-10-07 ENCOUNTER — Ambulatory Visit: Admitting: Obstetrics and Gynecology

## 2023-10-07 VITALS — BP 139/65 | HR 81 | Ht 64.0 in | Wt 203.4 lb

## 2023-10-07 DIAGNOSIS — Z3201 Encounter for pregnancy test, result positive: Secondary | ICD-10-CM | POA: Diagnosis not present

## 2023-10-07 DIAGNOSIS — Z3401 Encounter for supervision of normal first pregnancy, first trimester: Secondary | ICD-10-CM

## 2023-10-07 LAB — POCT URINE PREGNANCY: Preg Test, Ur: POSITIVE — AB

## 2023-10-07 MED ORDER — VITAFOL GUMMIES 3.33-0.333-34.8 MG PO CHEW: 3.0000 | CHEWABLE_TABLET | Freq: Every day | ORAL | 5 refills | Status: AC

## 2023-10-07 NOTE — Progress Notes (Signed)
  History:  Ms. Jillian Mcpherson is a 33 y.o. G1P1001 who presents to clinic today for pregnancy confirmation    Reports LMP was around 08/25/2023. She is taking prenatal gummies. She just switched two weeks ago to Zoloft  for anxiety and depression. She has a history of a cesarean section for breech presentation 10 years ago. She has a history of HTN, not on medication   Past Medical History:  Diagnosis Date   ADD (attention deficit disorder)    Anxiety    Depression    Hypertension     Past Surgical History:  Procedure Laterality Date   CESAREAN SECTION N/A 11/18/2013   Procedure: CESAREAN SECTION;  Surgeon: Wendelyn Halter, MD;  Location: [facility];  Service: Obstetrics;  Laterality: N/A;   CHOLECYSTECTOMY N/A 01/03/2014   Procedure: LAPAROSCOPIC CHOLECYSTECTOMY;  Surgeon: Beau Bound, MD;  Location: [facility];  Service: General;  Laterality: N/A;   TONSILLECTOMY      The following portions of the patient's history were reviewed and updated as appropriate: allergies, current medications, past family history, past medical history, past social history, past surgical history and problem list.   Review of Systems:  Pertinent items noted in HPI and remainder of comprehensive ROS otherwise negative.  Objective:  Physical Exam BP 139/65 (BP Location: Left Arm, Patient Position: Sitting, Cuff Size: Normal)   Pulse 81   Ht 5\' 4"  (1.626 m)   Wt 203 lb 6.4 oz (92.3 kg)   LMP 08/25/2023   BMI 34.91 kg/m  Physical Exam Constitutional:      Appearance: Normal appearance.  Pulmonary:     Effort: Pulmonary effort is normal.  Musculoskeletal:     Cervical back: Normal range of motion.  Neurological:     General: No focal deficit present.     Mental Status: She is alert and oriented to person, place, and time.  Psychiatric:        Mood and Affect: Mood normal.      Labs and Imaging Results for orders placed or performed in visit on 10/07/23 (from the past 24 hours)  POCT urine pregnancy      Status: Abnormal   Collection Time: 10/07/23  3:40 PM  Result Value Ref Range   Preg Test, Ur Positive (A) Negative    No results found.   Assessment & Plan:  1. Pregnancy test positive (Primary) PNV sent to pharmacy Will schedule dating u/s  Had questions about dietary changes in pregnancy Discussed WCC, new OB  Wll follow up for hx c/s and HTN  - POCT urine pregnancy   Approximately 15 minutes of total time was spent with this patient on history taking, coordination of care, education and documentation.   Zelma Hidden, FNP

## 2023-10-07 NOTE — Patient Instructions (Signed)
 [facility]  [address] [place], [place], [place] [address]

## 2023-10-08 ENCOUNTER — Encounter: Payer: Self-pay | Admitting: Obstetrics and Gynecology

## 2023-10-10 ENCOUNTER — Ambulatory Visit: Admitting: Professional Counselor

## 2023-10-10 DIAGNOSIS — F1290 Cannabis use, unspecified, uncomplicated: Secondary | ICD-10-CM

## 2023-10-10 DIAGNOSIS — F331 Major depressive disorder, recurrent, moderate: Secondary | ICD-10-CM

## 2023-10-10 NOTE — BH Specialist Note (Signed)
 Collaborative Care Initial Assessment  Session Start time: 9:00 am   Session End time: 10:00 am  Total time in minutes: 60 min   Type of Contact:  Face to Face Patient consent obtained:  Yes Types of Service: Collaborative care  Summary  Patient is a 33 yo female being referred to collaborative care by her pcp for anxiety and depression. Patient was engaged and cooperative during session.   Reason for referral in patient/family's own words:  "I need to stop smoking thc"  Patient's goal for today's visit: "Quit smoking marijuana for the baby"  History of Present illness:   The patient is a 33 year old female who presented for a collaborative care reassessment. She was seen one year ago and referred to psychiatry at that time but reports she was never able to secure a follow-up appointment. She is currently pregnant and experiencing several symptoms, including nausea, low appetite, increased sleep, irritability, and elevated anxiety. She recently transitioned from Lexapro  to Zoloft , starting the new medication approximately two weeks ago. While she is still adjusting, she reports some improvement in her overall mood. Her current PHQ-9 score is 5, indicating mild depression, while her GAD-7 score is 18, reflecting severe anxiety. She is currently being treated by her primary care provider for depression and anxiety and is not engaged with psychiatric care at this time.  A primary reason for her visit is her desire to stop smoking THC. She acknowledges that her husband also smokes, which adds to the difficulty in quitting. The patient has a psychiatric history that includes self-injury between the ages of 53 and 89, though she has not engaged in cutting since and denies any current or recent suicidal ideation. There is no history of psychiatric hospitalization, trauma, or formal substance use treatment. She reports recent housing instability, having previously been homeless, but is now staying  at her father-in-law's house. Although this living arrangement is sufficient for now, she does not view it as a long-term solution. Financial concerns are contributing to her overall stress.  Given her current symptoms, psychiatric history, pregnancy, and desire to discontinue THC use, we will proceed with a psychiatric consultation to evaluate medication management and determine the appropriate level of care. Additional behavioral health support will be explored to address anxiety and substance use, and referrals to perinatal mental health and community resources for housing and financial support may be considered. Follow-up will focus on monitoring symptom progression, medication response, and support for achieving her cessation goals.  Clinical Assessment   PHQ-9 Assessments:    10/10/2023    9:10 AM 10/07/2023    3:23 PM 09/15/2023    3:40 PM 05/19/2023    4:32 PM 12/31/2022    9:31 AM  Depression screen PHQ 2/9  Decreased Interest 1 0 0 0 0  Down, Depressed, Hopeless 1 0 0 0 1  PHQ - 2 Score 2 0 0 0 1  Altered sleeping 0 0  0 1  Tired, decreased energy 1 0  0 1  Change in appetite 1 0  0 1  Feeling bad or failure about yourself  0 0  0 0  Trouble concentrating 1 1  0 2  Moving slowly or fidgety/restless 0 0  0 0  Suicidal thoughts 0 0  0 0  PHQ-9 Score 5 1  0 6  Difficult doing work/chores Somewhat difficult   Not difficult at all Somewhat difficult    GAD-7 Assessments:    10/10/2023    9:10 AM 10/07/2023  3:23 PM 05/19/2023    4:32 PM 12/31/2022    9:32 AM  GAD 7 : Generalized Anxiety Score  Nervous, Anxious, on Edge 3 2 0 2  Control/stop worrying 3 2 0 2  Worry too much - different things 3 2 0 2  Trouble relaxing 3 2 0 2  Restless 0 0 0 0  Easily annoyed or irritable 3 2 0 2  Afraid - awful might happen 3 2 0 2  Total GAD 7 Score 18 12 0 12  Anxiety Difficulty Somewhat difficult  Not difficult at all Somewhat difficult     Social History:  Household: Lives with  husband and father and law Marital status: Married Number of Children: 1 daughter Employment: Unemployed Education:   Psychiatric Review of systems: Insomnia: Denies Changes in appetite: nausous and low appitite Decreased need for sleep: No Family history of bipolar disorder: No Hallucinations: No   Paranoia: No    Psychotropic medications: Current medications: Zoloft  25 mg Patient taking medications as prescribed: yes Side effects reported: Yes nausea   Current medications (medication list) Current Outpatient Medications on File Prior to Visit  Medication Sig Dispense Refill   ondansetron  (ZOFRAN  ODT) 4 MG disintegrating tablet Take 1 tablet (4 mg total) by mouth every 8 (eight) hours as needed for nausea or vomiting. (Patient not taking: Reported on 10/07/2023) 60 tablet 1   Prenatal MV & Min w/FA-DHA (PRENATAL GUMMIES PO) Take by mouth.     Prenatal Vit-Fe Phos-FA-Omega (VITAFOL GUMMIES) 3.33-0.333-34.8 MG CHEW Chew 3 tablets by mouth daily. 90 tablet 5   sertraline  (ZOLOFT ) 25 MG tablet Take 1 tablet (25 mg total) by mouth daily. 30 tablet 3   No current facility-administered medications on file prior to visit.    Psychiatric History: Past psychiatry diagnosis: MDD, GAD,  Patient currently being seen by therapist/psychiatrist: Denies  Prior Suicide Attempts: 13-15 brief episodes of cutting. Denies any cutting since then Past psychiatry Hospitalization(s): Denies Past history of violence: Denies  Traumatic Experiences: History or current traumatic events (natural disaster, house fire, etc.)? no History or current physical trauma?  no History or current emotional trauma?  no History or current sexual trauma?  no History or current domestic or intimate partner violence?  no PTSD symptoms if any traumatic experiences no   Alcohol and/or Substance Use History   Tobacco Alcohol Other substances  Current use  Denies THC 5-6 blunts a day  Past use     Past treatment       Withdrawal Potential: None  Self-harm Behaviors Risk Assessment Self-harm risk factors:  Past self harm, depression Patient endorses recent thoughts of harming self: Denies [place] Suicide Severity Rating Scale:   Guns in the home:    Protective factors: Expecting a baby, motivated for change  Danger to Others Risk Assessment Danger to others risk factors:  None Patient endorses recent thoughts of harming others: Denies   Consulting civil engineer discussed emergency crisis plan with client and provided local emergency services resources.  Mental status exam:   General Appearance Gene Kemps:  Casual Eye Contact:  Good Motor Behavior:  Normal Speech:  Normal Level of Consciousness:  Alert Mood:  Anxious Affect:  Appropriate Anxiety Level:  Minimal Thought Process:  Coherent Thought Content:  WNL Perception:  Normal Judgment:  Good Insight:  Present  Diagnosis:   Goals: Increase healthy adjustment to current life circumstances   Interventions: Motivational Interviewing

## 2023-10-15 ENCOUNTER — Other Ambulatory Visit: Payer: Self-pay | Admitting: Obstetrics & Gynecology

## 2023-10-15 DIAGNOSIS — O09529 Supervision of elderly multigravida, unspecified trimester: Secondary | ICD-10-CM

## 2023-10-15 DIAGNOSIS — O3680X0 Pregnancy with inconclusive fetal viability, not applicable or unspecified: Secondary | ICD-10-CM

## 2023-10-15 DIAGNOSIS — Z98891 History of uterine scar from previous surgery: Secondary | ICD-10-CM

## 2023-10-15 DIAGNOSIS — Z8679 Personal history of other diseases of the circulatory system: Secondary | ICD-10-CM

## 2023-10-16 ENCOUNTER — Ambulatory Visit: Admitting: Radiology

## 2023-10-16 DIAGNOSIS — Z98891 History of uterine scar from previous surgery: Secondary | ICD-10-CM | POA: Diagnosis not present

## 2023-10-16 DIAGNOSIS — O09529 Supervision of elderly multigravida, unspecified trimester: Secondary | ICD-10-CM

## 2023-10-16 DIAGNOSIS — O3680X0 Pregnancy with inconclusive fetal viability, not applicable or unspecified: Secondary | ICD-10-CM

## 2023-10-16 DIAGNOSIS — Z3A01 Less than 8 weeks gestation of pregnancy: Secondary | ICD-10-CM

## 2023-10-16 DIAGNOSIS — O09521 Supervision of elderly multigravida, first trimester: Secondary | ICD-10-CM

## 2023-10-16 DIAGNOSIS — Z8679 Personal history of other diseases of the circulatory system: Secondary | ICD-10-CM

## 2023-10-16 NOTE — Progress Notes (Signed)
 US : unknown LMP Anteverted uterus with single viable early IUP.  GS intact within upper mid uterine cavity CRL = 13.98 mm = 7+5 weeks,  FHR = 158 bpm, Yolk Sac = 5 mm Normal L ov  - Rt ov not seen well - neg adnexal regions neg CDS - no free fluid present EDD = 05-29-24 by early US 

## 2023-10-24 ENCOUNTER — Ambulatory Visit: Admitting: Professional Counselor

## 2023-10-29 ENCOUNTER — Telehealth (INDEPENDENT_AMBULATORY_CARE_PROVIDER_SITE_OTHER): Payer: Self-pay | Admitting: Professional Counselor

## 2023-10-29 DIAGNOSIS — F331 Major depressive disorder, recurrent, moderate: Secondary | ICD-10-CM

## 2023-10-29 NOTE — BH Specialist Note (Signed)
 Virtual Behavioral Health Treatment Plan Team Note  MRN: [id number] NAME: Jillian Mcpherson  DATE: 10/29/2023  Start time: Start Time: 1111 End time: Stop Time: 1121 Total time: Total Time in Minutes (Visit): 10  Total number of Virtual BH Treatment Team Plan encounters: 1/4  Treatment Team Attendees: Dr. Matias Soman and Delrae Field  Attestation signed by Elesa Grills, MD at 10/29/2023 11:15 AM   Collaborative Care Psychiatric Consultant Case Review    Assessment/Provisional Diagnosis Jillian Mcpherson is a 33 y.o. year old female who is currently pregnant. The patient is referred for [facility] use and Anxiety.    # THC use, Severe.  # Substance induced anxiety disorder.    Recommendation Referral to substance abuse program.  Increase Zoloft  to 50 mg. [facility] specialist to follow up.     Thank you for your consult. Please contact our collaborative care team for any questions or concerns.    I spent 15 minutes chart reviewing, discussing with Mr. Toni Frank and documenting in the chart.    The above treatment considerations and suggestions are based on consultation with the [facility] specialist and/or PCP and a review of information available in the shared registry and the patient's Electronic Health Record (EHR). I have not personally examined the patient. All recommendations should be implemented with consideration of the patient's relevant prior history and current clinical status. Please feel free to call me with any questions about the care of this patient.      Diagnoses:    ICD-10-CM   1. Moderate episode of recurrent major depressive disorder (HCC)  F33.1       Goals, Interventions and Follow-up Plan Goals: Increase healthy adjustment to current life circumstances Interventions: Motivational Interviewing Medication Management Recommendations: Increase Zoloft  to 50 mg Follow-up Plan:  Refer to Substance Abuse Therapy at [facility]  History of the present illness Presenting Problem/Current  Symptoms:  The patient is a 33 year old female who presented for a collaborative care reassessment. She was seen one year ago and referred to psychiatry at that time but reports she was never able to secure a follow-up appointment. She is currently pregnant and experiencing several symptoms, including nausea, low appetite, increased sleep, irritability, and elevated anxiety. She recently transitioned from Lexapro  to Zoloft , starting the new medication approximately two weeks ago. While she is still adjusting, she reports some improvement in her overall mood. Her current PHQ-9 score is 5, indicating mild depression, while her GAD-7 score is 18, reflecting severe anxiety. She is currently being treated by her primary care provider for depression and anxiety and is not engaged with psychiatric care at this time.   A primary reason for her visit is her desire to stop smoking THC. She acknowledges that her husband also smokes, which adds to the difficulty in quitting. The patient has a psychiatric history that includes self-injury between the ages of 33 and 57, though she has not engaged in cutting since and denies any current or recent suicidal ideation. There is no history of psychiatric hospitalization, trauma, or formal substance use treatment. She reports recent housing instability, having previously been homeless, but is now staying at her father-in-law's house. Although this living arrangement is sufficient for now, she does not view it as a long-term solution. Financial concerns are contributing to her overall stress.   Given her current symptoms, psychiatric history, pregnancy, and desire to discontinue THC use, we will proceed with a psychiatric consultation to evaluate medication management and determine the appropriate level of care. Additional behavioral  health support will be explored to address anxiety and substance use, and referrals to perinatal mental health and community resources for housing and  financial support may be considered. Follow-up will focus on monitoring symptom progression, medication response, and support for achieving her cessation goals.  Screenings PHQ-9 Assessments:     10/10/2023    9:10 AM 10/07/2023    3:23 PM 09/15/2023    3:40 PM  Depression screen PHQ 2/9  Decreased Interest 1 0 0  Down, Depressed, Hopeless 1 0 0  PHQ - 2 Score 2 0 0  Altered sleeping 0 0   Tired, decreased energy 1 0   Change in appetite 1 0   Feeling bad or failure about yourself  0 0   Trouble concentrating 1 1   Moving slowly or fidgety/restless 0 0   Suicidal thoughts 0 0   PHQ-9 Score 5 1   Difficult doing work/chores Somewhat difficult     GAD-7 Assessments:     10/10/2023    9:10 AM 10/07/2023    3:23 PM 05/19/2023    4:32 PM 12/31/2022    9:32 AM  GAD 7 : Generalized Anxiety Score  Nervous, Anxious, on Edge 3 2 0 2  Control/stop worrying 3 2 0 2  Worry too much - different things 3 2 0 2  Trouble relaxing 3 2 0 2  Restless 0 0 0 0  Easily annoyed or irritable 3 2 0 2  Afraid - awful might happen 3 2 0 2  Total GAD 7 Score 18 12 0 12  Anxiety Difficulty Somewhat difficult  Not difficult at all Somewhat difficult    Past Medical History Past Medical History:  Diagnosis Date   ADD (attention deficit disorder)    Anxiety    Depression    Hypertension     Vital signs: There were no vitals filed for this visit.  Allergies:  Allergies as of 10/29/2023   (No Known Allergies)    Medication History Current medications:  Outpatient Encounter Medications as of 10/29/2023  Medication Sig   ondansetron  (ZOFRAN  ODT) 4 MG disintegrating tablet Take 1 tablet (4 mg total) by mouth every 8 (eight) hours as needed for nausea or vomiting. (Patient not taking: Reported on 10/07/2023)   Prenatal MV & Min w/FA-DHA (PRENATAL GUMMIES PO) Take by mouth.   Prenatal Vit-Fe Phos-FA-Omega (VITAFOL  GUMMIES) 3.33-0.333-34.8 MG CHEW Chew 3 tablets by mouth daily.   sertraline  (ZOLOFT )  25 MG tablet Take 1 tablet (25 mg total) by mouth daily.   No facility-administered encounter medications on file as of 10/29/2023.     Scribe for Treatment Team: Regina Capes

## 2023-11-03 ENCOUNTER — Telehealth: Payer: Self-pay | Admitting: Family Medicine

## 2023-11-03 ENCOUNTER — Other Ambulatory Visit: Payer: Self-pay | Admitting: Family Medicine

## 2023-11-03 DIAGNOSIS — F331 Major depressive disorder, recurrent, moderate: Secondary | ICD-10-CM

## 2023-11-03 MED ORDER — SERTRALINE HCL 50 MG PO TABS: 50.0000 mg | ORAL_TABLET | Freq: Every day | ORAL | 3 refills | Status: AC

## 2023-11-24 ENCOUNTER — Other Ambulatory Visit: Payer: Self-pay | Admitting: Obstetrics & Gynecology

## 2023-11-24 DIAGNOSIS — Z3682 Encounter for antenatal screening for nuchal translucency: Secondary | ICD-10-CM

## 2023-11-25 ENCOUNTER — Encounter: Admitting: *Deleted

## 2023-11-25 ENCOUNTER — Encounter: Payer: Self-pay | Admitting: Women's Health

## 2023-11-25 ENCOUNTER — Ambulatory Visit: Admitting: Women's Health

## 2023-11-25 ENCOUNTER — Other Ambulatory Visit

## 2023-11-25 ENCOUNTER — Other Ambulatory Visit (HOSPITAL_COMMUNITY)
Admission: RE | Admit: 2023-11-25 | Discharge: 2023-11-25 | Disposition: A | Discharge status: Home / Self Care | Source: Ambulatory Visit | Attending: Women's Health | Admitting: Women's Health

## 2023-11-25 VITALS — BP 133/89 | HR 75 | Wt 196.0 lb

## 2023-11-25 DIAGNOSIS — Z3481 Encounter for supervision of other normal pregnancy, first trimester: Secondary | ICD-10-CM | POA: Diagnosis not present

## 2023-11-25 DIAGNOSIS — Z1332 Encounter for screening for maternal depression: Secondary | ICD-10-CM | POA: Diagnosis not present

## 2023-11-25 DIAGNOSIS — Z3A13 13 weeks gestation of pregnancy: Secondary | ICD-10-CM

## 2023-11-25 DIAGNOSIS — Z31430 Encounter of female for testing for genetic disease carrier status for procreative management: Secondary | ICD-10-CM | POA: Diagnosis not present

## 2023-11-25 DIAGNOSIS — Z3682 Encounter for antenatal screening for nuchal translucency: Secondary | ICD-10-CM | POA: Diagnosis not present

## 2023-11-25 DIAGNOSIS — Z131 Encounter for screening for diabetes mellitus: Secondary | ICD-10-CM

## 2023-11-25 DIAGNOSIS — Z3490 Encounter for supervision of normal pregnancy, unspecified, unspecified trimester: Secondary | ICD-10-CM | POA: Insufficient documentation

## 2023-11-25 DIAGNOSIS — F419 Anxiety disorder, unspecified: Secondary | ICD-10-CM

## 2023-11-25 DIAGNOSIS — F32A Depression, unspecified: Secondary | ICD-10-CM

## 2023-11-25 DIAGNOSIS — Z98891 History of uterine scar from previous surgery: Secondary | ICD-10-CM

## 2023-11-25 DIAGNOSIS — Z8679 Personal history of other diseases of the circulatory system: Secondary | ICD-10-CM

## 2023-11-25 MED ORDER — DOXYLAMINE-PYRIDOXINE 10-10 MG PO TBEC: DELAYED_RELEASE_TABLET | ORAL | 6 refills | Status: DC

## 2023-11-25 MED ORDER — BLOOD PRESSURE MONITOR DEVI: 1.0000 | 0 refills | Status: AC

## 2023-11-25 MED ORDER — ASPIRIN 81 MG PO TBEC: 162.0000 mg | DELAYED_RELEASE_TABLET | Freq: Every day | ORAL | 2 refills | Status: DC

## 2023-11-25 NOTE — Progress Notes (Signed)
 INITIAL OBSTETRICAL VISIT Patient name: Jillian Mcpherson MRN [id number]  Date of birth: 1990/11/09 Chief Complaint:   Initial Prenatal Visit  History of Present Illness:   Jillian Mcpherson is a 33 y.o. G72P1001 Caucasian female at [redacted]w[redacted]d by US  at 7 weeks with an Estimated Date of Delivery: 05/29/24 being seen today for her initial obstetrical visit.   No LMP recorded (lmp unknown). Patient is pregnant. Her obstetrical history is significant for term c/s for breech, declined ECV.  Leaning towards RCS H/O HTN years ago, lost a lot of weight, bp normalized, no meds since Dep/anx- on zoloft , just increased to 50mg  by PCP about a week ago. Does online therapy.  THC use- wants to slow down/quit, therapist referring to [facility] N/V- requests meds  Last pap 12/31/22. Results were: NILM w/ HRHPV negative     11/25/2023   10:54 AM 10/10/2023    9:10 AM 10/07/2023    3:23 PM 09/15/2023    3:40 PM 05/19/2023    4:32 PM  Depression screen PHQ 2/9  Decreased Interest 0 1 0 0 0  Down, Depressed, Hopeless 0 1 0 0 0  PHQ - 2 Score 0 2 0 0 0  Altered sleeping 1 0 0  0  Tired, decreased energy 1 1 0  0  Change in appetite 1 1 0  0  Feeling bad or failure about yourself  0 0 0  0  Trouble concentrating 1 1 1   0  Moving slowly or fidgety/restless 0 0 0  0  Suicidal thoughts 0 0 0  0  PHQ-9 Score 4 5 1   0  Difficult doing work/chores  Somewhat difficult   Not difficult at all        11/25/2023   10:55 AM 10/10/2023    9:10 AM 10/07/2023    3:23 PM 05/19/2023    4:32 PM  GAD 7 : Generalized Anxiety Score  Nervous, Anxious, on Edge 1 3 2  0  Control/stop worrying 1 3 2  0  Worry too much - different things 1 3 2  0  Trouble relaxing 1 3 2  0  Restless 0 0 0 0  Easily annoyed or irritable 1 3 2  0  Afraid - awful might happen 1 3 2  0  Total GAD 7 Score 6 18 12  0  Anxiety Difficulty  Somewhat difficult  Not difficult at all     Review of Systems:   Pertinent items are noted in HPI Denies  cramping/contractions, leakage of fluid, vaginal bleeding, abnormal vaginal discharge w/ itching/odor/irritation, headaches, visual changes, shortness of breath, chest pain, abdominal pain, severe nausea/vomiting, or problems with urination or bowel movements unless otherwise stated above.  Pertinent History Reviewed:  Reviewed past medical,surgical, social, obstetrical and family history.  Reviewed problem list, medications and allergies. OB History  Gravida Para Term Preterm AB Living  2 1 1   1   SAB IAB Ectopic Multiple Live Births      1    # Outcome Date GA Lbr Len/2nd Weight Sex Type Anes PTL Lv  2 Current           1 Term 11/18/13 [redacted]w[redacted]d  7 lb 11.6 oz (3.505 kg) F CS-LTranv Spinal  LIV   Physical Assessment:   Vitals:   11/25/23 1052  BP: 133/89  Pulse: 75  Weight: 196 lb (88.9 kg)  Body mass index is 33.64 kg/m.       Physical Examination:  General appearance - well appearing, and in  no distress  Mental status - alert, oriented to person, place, and time  Psych:  She has a normal mood and affect  Skin - warm and dry, normal color, no suspicious lesions noted  Chest - effort normal, all lung fields clear to auscultation bilaterally  Heart - normal rate and regular rhythm  Abdomen - soft, nontender  Extremities:  No swelling or varicosities noted  Thin prep pap is not done   Chaperone: N/A  TODAY'S NT US  13+3 wks,measurements c/w dates,CRL 76.75 mm,NB present,NT 1.9 mm,normal ovaries,FHR 164 bpm   No results found for this or any previous visit (from the past 24 hours).  Assessment & Plan:  1) Low-Risk Pregnancy G2P1001 at [redacted]w[redacted]d with an Estimated Date of Delivery: 05/29/24   2) Initial OB visit  3) H/O HTN> yrs ago, lost weight, bp normalized, no meds since. Check baseline labs today, start ASA, monitor home bp's weekly- if >140/90 let us  know  4) Prev c/s> for breech, discussed TOLAC and gave consent to review  5) THC use> advised cessation, therapist referring  to [facility]  6) N/V> rx diclegis  (hopefully will help cut back on THC use for nausea as well)  7) Dep/anx> recently increased zoloft  to 50mg , has therapist  Meds:  Meds ordered this encounter  Medications   Blood Pressure Monitor DEVI    Sig: 1 kit by Does not apply route as directed.    Dispense:  1 each    Refill:  0   aspirin  EC 81 MG tablet    Sig: Take 2 tablets (162 mg total) by mouth daily. Swallow whole.    Dispense:  180 tablet    Refill:  2   Doxylamine -Pyridoxine  (DICLEGIS ) 10-10 MG TBEC    Sig: 2 tabs q hs, if sx persist add 1 tab q am on day 3, if sx persist add 1 tab q afternoon on day 4    Dispense:  100 tablet    Refill:  6    Initial labs obtained Continue prenatal vitamins Reviewed n/v relief measures and warning s/s to report Reviewed recommended weight gain based on pre-gravid BMI Encouraged well-balanced diet Genetic & carrier screening discussed: requests Panorama, NT/IT, and Horizon  Ultrasound discussed; fetal survey: requested CCNC completed> form faxed if has or is planning to apply for medicaid The nature of [employer] for [employer] with multiple MDs and other Advanced Practice Providers was explained to patient; also emphasized that fellows, residents, and students are part of our team. Does not have home bp cuff. Office bp cuff given: no. Rx sent: yes. Check bp weekly, let us  know if consistently >140/90.   Follow-up: Return in about 4 weeks (around 12/23/2023) for LROB, 2nd IT, CNM, in person; then 7wks from now anatomy u/s and LROB w/ CNM.   Orders Placed This Encounter  Procedures   Urine Culture   CBC/D/Plt+RPR+Rh+ABO+RubIgG...   HgB A1c   PANORAMA PRENATAL TEST   HORIZON CUSTOM   Protein / creatinine ratio, urine   Comp Met (CMET)    Suzen JONELLE Fetters CNM, [facility] 11/25/2023 11:50 AM

## 2023-11-25 NOTE — Progress Notes (Signed)
 US  13+3 wks,measurements c/w dates,CRL 76.75 mm,FHR 164 bpm,normal ovaries,unable to obtain NT,please have pt come back today for more images

## 2023-11-25 NOTE — Patient Instructions (Signed)
[place], thank you for choosing our office today! We appreciate the opportunity to meet your healthcare needs. You may receive a short survey by mail, e-mail, or through [employer]. If you are happy with your care we would appreciate if you could take just a few minutes to complete the survey questions. We read all of your comments and take your feedback very seriously. Thank you again for choosing our office.  [facility] [employer] [facility] Team at [place] at [facility] ([place],  [address]) Entrance C, located off of [place] parking   Nausea & Vomiting Have saltine crackers or pretzels by your bed and eat a few bites before you raise your head out of bed in the morning Eat small frequent meals throughout the day instead of large meals Drink plenty of fluids throughout the day to stay hydrated, just don't drink a lot of fluids with your meals.  This can make your stomach fill up faster making you feel sick Do not brush your teeth right after you eat Products with real ginger are good for nausea, like ginger ale and ginger hard candy Make sure it says made with real ginger! Sucking on sour candy like lemon heads is also good for nausea If your prenatal vitamins make you nauseated, take them at night so you will sleep through the nausea Sea Bands If you feel like you need medicine for the nausea & vomiting please let us know If you are unable to keep any fluids or food down please let us know   Constipation Drink plenty of fluid, preferably water, throughout the day Eat foods high in fiber such as fruits, vegetables, and grains Exercise, such as walking, is a good way to keep your bowels regular Drink warm fluids, especially warm prune juice, or decaf coffee Eat a 1/2 cup of real oatmeal (not instant), 1/2 cup applesauce, and 1/2-1 cup warm prune juice every day If needed, you may take Colace (docusate sodium) stool softener  once or twice a day to help keep the stool soft.  If you still are having problems with constipation, you may take Miralax once daily as needed to help keep your bowels regular.   Home Blood Pressure Monitoring for Patients   Your provider has recommended that you check your blood pressure (BP) at least once a week at home. If you do not have a blood pressure cuff at home, one will be provided for you. Contact your provider if you have not received your monitor within 1 week.   Helpful Tips for Accurate Home Blood Pressure Checks  Don't smoke, exercise, or drink caffeine 30 minutes before checking your BP Use the restroom before checking your BP (a full bladder can raise your pressure) Relax in a comfortable upright chair Feet on the ground Left arm resting comfortably on a flat surface at the level of your heart Legs uncrossed Back supported Sit quietly and don't talk Place the cuff on your bare arm Adjust snuggly, so that only two fingertips can fit between your skin and the top of the cuff Check 2 readings separated by at least one minute Keep a log of your BP readings For a visual, please reference this diagram: http://ccnc.care/bpdiagram  Provider Name: [facility]     Phone: [phone]  Zone 1: ALL CLEAR  Continue to monitor your symptoms:  BP reading is less than 140 (top number) or less than 90 (bottom  number)  No right upper stomach pain No headaches or seeing spots No feeling nauseated or throwing up No swelling in face and hands  Zone 2: CAUTION Call your doctor's office for any of the following:  BP reading is greater than 140 (top number) or greater than 90 (bottom number)  Stomach pain under your ribs in the middle or right side Headaches or seeing spots Feeling nauseated or throwing up Swelling in face and hands  Zone 3: EMERGENCY  Seek immediate medical care if you have any of the following:  BP reading is greater than160 (top number) or greater than  110 (bottom number) Severe headaches not improving with Tylenol Serious difficulty catching your breath Any worsening symptoms from Zone 2    First Trimester of Pregnancy The first trimester of pregnancy is from week 1 until the end of week 12 (months 1 through 3). A week after a sperm fertilizes an egg, the egg will implant on the wall of the uterus. This embryo will begin to develop into a baby. Genes from you and your partner are forming the baby. The female genes determine whether the baby is a boy or a girl. At 6-8 weeks, the eyes and face are formed, and the heartbeat can be seen on ultrasound. At the end of 12 weeks, all the baby's organs are formed.  Now that you are pregnant, you will want to do everything you can to have a healthy baby. Two of the most important things are to get good prenatal care and to follow your health care provider's instructions. Prenatal care is all the medical care you receive before the baby's birth. This care will help prevent, find, and treat any problems during the pregnancy and childbirth. BODY CHANGES Your body goes through many changes during pregnancy. The changes vary from woman to woman.  You may gain or lose a couple of pounds at first. You may feel sick to your stomach (nauseous) and throw up (vomit). If the vomiting is uncontrollable, call your health care provider. You may tire easily. You may develop headaches that can be relieved by medicines approved by your health care provider. You may urinate more often. Painful urination may mean you have a bladder infection. You may develop heartburn as a result of your pregnancy. You may develop constipation because certain hormones are causing the muscles that push waste through your intestines to slow down. You may develop hemorrhoids or swollen, bulging veins (varicose veins). Your breasts may begin to grow larger and become tender. Your nipples may stick out more, and the tissue that surrounds them  (areola) may become darker. Your gums may bleed and may be sensitive to brushing and flossing. Dark spots or blotches (chloasma, mask of pregnancy) may develop on your face. This will likely fade after the baby is born. Your menstrual periods will stop. You may have a loss of appetite. You may develop cravings for certain kinds of food. You may have changes in your emotions from day to day, such as being excited to be pregnant or being concerned that something may go wrong with the pregnancy and baby. You may have more vivid and strange dreams. You may have changes in your hair. These can include thickening of your hair, rapid growth, and changes in texture. Some women also have hair loss during or after pregnancy, or hair that feels dry or thin. Your hair will most likely return to normal after your baby is born. WHAT TO EXPECT AT YOUR PRENATAL  VISITS During a routine prenatal visit: You will be weighed to make sure you and the baby are growing normally. Your blood pressure will be taken. Your abdomen will be measured to track your baby's growth. The fetal heartbeat will be listened to starting around week 10 or 12 of your pregnancy. Test results from any previous visits will be discussed. Your health care provider may ask you: How you are feeling. If you are feeling the baby move. If you have had any abnormal symptoms, such as leaking fluid, bleeding, severe headaches, or abdominal cramping. If you have any questions. Other tests that may be performed during your first trimester include: Blood tests to find your blood type and to check for the presence of any previous infections. They will also be used to check for low iron levels (anemia) and Rh antibodies. Later in the pregnancy, blood tests for diabetes will be done along with other tests if problems develop. Urine tests to check for infections, diabetes, or protein in the urine. An ultrasound to confirm the proper growth and development  of the baby. An amniocentesis to check for possible genetic problems. Fetal screens for spina bifida and Down syndrome. You may need other tests to make sure you and the baby are doing well. HOME CARE INSTRUCTIONS  Medicines Follow your health care provider's instructions regarding medicine use. Specific medicines may be either safe or unsafe to take during pregnancy. Take your prenatal vitamins as directed. If you develop constipation, try taking a stool softener if your health care provider approves. Diet Eat regular, well-balanced meals. Choose a variety of foods, such as meat or vegetable-based protein, fish, milk and low-fat dairy products, vegetables, fruits, and whole grain breads and cereals. Your health care provider will help you determine the amount of weight gain that is right for you. Avoid raw meat and uncooked cheese. These carry germs that can cause birth defects in the baby. Eating four or five small meals rather than three large meals a day may help relieve nausea and vomiting. If you start to feel nauseous, eating a few soda crackers can be helpful. Drinking liquids between meals instead of during meals also seems to help nausea and vomiting. If you develop constipation, eat more high-fiber foods, such as fresh vegetables or fruit and whole grains. Drink enough fluids to keep your urine clear or pale yellow. Activity and Exercise Exercise only as directed by your health care provider. Exercising will help you: Control your weight. Stay in shape. Be prepared for labor and delivery. Experiencing pain or cramping in the lower abdomen or low back is a good sign that you should stop exercising. Check with your health care provider before continuing normal exercises. Try to avoid standing for long periods of time. Move your legs often if you must stand in one place for a long time. Avoid heavy lifting. Wear low-heeled shoes, and practice good posture. You may continue to have sex  unless your health care provider directs you otherwise. Relief of Pain or Discomfort Wear a good support bra for breast tenderness.   Take warm sitz baths to soothe any pain or discomfort caused by hemorrhoids. Use hemorrhoid cream if your health care provider approves.   Rest with your legs elevated if you have leg cramps or low back pain. If you develop varicose veins in your legs, wear support hose. Elevate your feet for 15 minutes, 3-4 times a day. Limit salt in your diet. Prenatal Care Schedule your prenatal visits by the  twelfth week of pregnancy. They are usually scheduled monthly at first, then more often in the last 2 months before delivery. Write down your questions. Take them to your prenatal visits. Keep all your prenatal visits as directed by your health care provider. Safety Wear your seat belt at all times when driving. Make a list of emergency phone numbers, including numbers for family, friends, the hospital, and police and fire departments. General Tips Ask your health care provider for a referral to a local prenatal education class. Begin classes no later than at the beginning of month 6 of your pregnancy. Ask for help if you have counseling or nutritional needs during pregnancy. Your health care provider can offer advice or refer you to specialists for help with various needs. Do not use hot tubs, steam rooms, or saunas. Do not douche or use tampons or scented sanitary pads. Do not cross your legs for long periods of time. Avoid cat litter boxes and soil used by cats. These carry germs that can cause birth defects in the baby and possibly loss of the fetus by miscarriage or stillbirth. Avoid all smoking, herbs, alcohol, and medicines not prescribed by your health care provider. Chemicals in these affect the formation and growth of the baby. Schedule a dentist appointment. At home, brush your teeth with a soft toothbrush and be gentle when you floss. SEEK MEDICAL CARE IF:   You have dizziness. You have mild pelvic cramps, pelvic pressure, or nagging pain in the abdominal area. You have persistent nausea, vomiting, or diarrhea. You have a bad smelling vaginal discharge. You have pain with urination. You notice increased swelling in your face, hands, legs, or ankles. SEEK IMMEDIATE MEDICAL CARE IF:  You have a fever. You are leaking fluid from your vagina. You have spotting or bleeding from your vagina. You have severe abdominal cramping or pain. You have rapid weight gain or loss. You vomit blood or material that looks like coffee grounds. You are exposed to [place] measles and have never had them. You are exposed to fifth disease or chickenpox. You develop a severe headache. You have shortness of breath. You have any kind of trauma, such as from a fall or a car accident. Document Released: 04/30/2001 Document Revised: 09/20/2013 Document Reviewed: 03/16/2013 [facility] Patient Information 2015 [place], [place]. This information is not intended to replace advice given to you by your health care provider. Make sure you discuss any questions you have with your health care provider.

## 2023-11-26 LAB — CERVICOVAGINAL ANCILLARY ONLY
Chlamydia: NEGATIVE
Comment: NEGATIVE
Comment: NORMAL
Neisseria Gonorrhea: NEGATIVE

## 2023-11-26 LAB — CBC/D/PLT+RPR+RH+ABO+RUBIGG...
Antibody Screen: NEGATIVE
Basophils Absolute: 0.1 x10E3/uL (ref 0.0–0.2)
Basos: 0 %
EOS (ABSOLUTE): 0.1 x10E3/uL (ref 0.0–0.4)
Eos: 1 %
HCV Ab: NONREACTIVE
HIV Screen 4th Generation wRfx: NONREACTIVE
Hematocrit: 39.3 % (ref 34.0–46.6)
Hemoglobin: 12.8 g/dL (ref 11.1–15.9)
Hepatitis B Surface Ag: NEGATIVE
Immature Grans (Abs): 0 x10E3/uL (ref 0.0–0.1)
Immature Granulocytes: 0 %
Lymphocytes Absolute: 2.7 x10E3/uL (ref 0.7–3.1)
Lymphs: 18 %
MCH: 29.2 pg (ref 26.6–33.0)
MCHC: 32.6 g/dL (ref 31.5–35.7)
MCV: 90 fL (ref 79–97)
Monocytes Absolute: 0.7 x10E3/uL (ref 0.1–0.9)
Monocytes: 5 %
Neutrophils Absolute: 11.5 x10E3/uL — ABNORMAL HIGH (ref 1.4–7.0)
Neutrophils: 76 %
Platelets: 403 x10E3/uL (ref 150–450)
RBC: 4.38 x10E6/uL (ref 3.77–5.28)
RDW: 13.3 % (ref 11.7–15.4)
RPR Ser Ql: NONREACTIVE
Rh Factor: POSITIVE
Rubella Antibodies, IGG: 5.93 {index} (ref >0.99)
WBC: 15.1 x10E3/uL — ABNORMAL HIGH (ref 3.4–10.8)

## 2023-11-26 LAB — COMPREHENSIVE METABOLIC PANEL WITH GFR
ALT: 9 IU/L (ref 0–32)
AST: 14 IU/L (ref 0–40)
Albumin: 4.1 g/dL (ref 3.9–4.9)
Alkaline Phosphatase: 70 IU/L (ref 44–121)
BUN/Creatinine Ratio: 16 (ref 9–23)
BUN: 8 mg/dL (ref 6–20)
Bilirubin Total: <0.2 mg/dL (ref 0.0–1.2)
CO2: 19 mmol/L — ABNORMAL LOW (ref 20–29)
Calcium: 9.1 mg/dL (ref 8.7–10.2)
Chloride: 103 mmol/L (ref 96–106)
Creatinine, Ser: 0.51 mg/dL — ABNORMAL LOW (ref 0.57–1.00)
Globulin, Total: 2.5 g/dL (ref 1.5–4.5)
Glucose: 93 mg/dL (ref 70–99)
Potassium: 4 mmol/L (ref 3.5–5.2)
Sodium: 138 mmol/L (ref 134–144)
Total Protein: 6.6 g/dL (ref 6.0–8.5)
eGFR: 126 mL/min/1.73 (ref >59)

## 2023-11-26 LAB — PROTEIN / CREATININE RATIO, URINE
Creatinine, Urine: 328.9 mg/dL
Protein, Ur: 34.8 mg/dL
Protein/Creat Ratio: 106 mg/g{creat} (ref 0–200)

## 2023-11-26 LAB — HEMOGLOBIN A1C
Est. average glucose Bld gHb Est-mCnc: 103 mg/dL
Hgb A1c MFr Bld: 5.2 % (ref 4.8–5.6)

## 2023-11-26 LAB — HCV INTERPRETATION

## 2023-11-27 LAB — INTEGRATED 1
Crown Rump Length: 76.8 mm
Gest. Age on Collection Date: 13.4 wk
Maternal Age at EDD: 35.4 a
Nuchal Translucency (NT): 1.9 mm
Number of Fetuses: 1
PAPP-A Value: 535.1 ng/mL
Sonographer ID#: 309760
Weight: 196 [lb_av]

## 2023-11-27 LAB — URINE CULTURE

## 2023-12-02 ENCOUNTER — Ambulatory Visit: Payer: Self-pay | Admitting: Women's Health

## 2023-12-02 LAB — PANORAMA PRENATAL TEST FULL PANEL:PANORAMA TEST PLUS 5 ADDITIONAL MICRODELETIONS: FETAL FRACTION: 10.3

## 2023-12-07 LAB — HORIZON CUSTOM: REPORT SUMMARY: NEGATIVE

## 2023-12-23 ENCOUNTER — Ambulatory Visit: Payer: Self-pay | Admitting: Obstetrics and Gynecology

## 2023-12-23 VITALS — BP 128/90 | HR 114 | Wt 189.0 lb

## 2023-12-23 DIAGNOSIS — Z8679 Personal history of other diseases of the circulatory system: Secondary | ICD-10-CM

## 2023-12-23 DIAGNOSIS — F32A Depression, unspecified: Secondary | ICD-10-CM

## 2023-12-23 DIAGNOSIS — Z98891 History of uterine scar from previous surgery: Secondary | ICD-10-CM

## 2023-12-23 DIAGNOSIS — F419 Anxiety disorder, unspecified: Secondary | ICD-10-CM

## 2023-12-23 DIAGNOSIS — Z3A17 17 weeks gestation of pregnancy: Secondary | ICD-10-CM

## 2023-12-23 DIAGNOSIS — R030 Elevated blood-pressure reading, without diagnosis of hypertension: Secondary | ICD-10-CM

## 2023-12-23 DIAGNOSIS — Z3482 Encounter for supervision of other normal pregnancy, second trimester: Secondary | ICD-10-CM

## 2023-12-23 DIAGNOSIS — O99332 Smoking (tobacco) complicating pregnancy, second trimester: Secondary | ICD-10-CM

## 2023-12-23 MED ORDER — BLOOD PRESSURE MONITOR MISC: 0 refills | Status: AC

## 2023-12-23 NOTE — Progress Notes (Signed)
   PRENATAL VISIT NOTE  Subjective:  Jillian Mcpherson is a 33 y.o. G2P1001 at [redacted]w[redacted]d being seen today for ongoing prenatal care.  She is currently monitored for the following issues for this low-risk pregnancy and has S/P C-section; History of chronic hypertension; Anxiety and depression; Nausea; Lumbar back pain; Tobacco use; Marijuana use; and Supervision of normal pregnancy on their problem list.  Patient reports .  Contractions: Not present. Vag. Bleeding: None.   . Denies leaking of fluid.   The following portions of the patient's history were reviewed and updated as appropriate: allergies, current medications, past family history, past medical history, past social history, past surgical history and problem list.   Objective:    Vitals:   12/23/23 1550 12/23/23 1557  BP: (!) 140/79 (!) 128/90  Pulse: (!) 114   Weight: 189 lb (85.7 kg)     Fetal Status:  Fetal Heart Rate (bpm): 136        General: Alert, oriented and cooperative. Patient is in no acute distress.  Skin: Skin is warm and dry. No rash noted.   Cardiovascular: Normal heart rate noted  Respiratory: Normal respiratory effort, no problems with respiration noted  Abdomen: Soft, gravid, appropriate for gestational age.  Pain/Pressure: Present     Pelvic: Cervical exam deferred        Extremities: Normal range of motion.     Mental Status: Normal mood and affect. Normal behavior. Normal judgment and thought content.   Assessment and Plan:  Pregnancy: G2P1001 at [redacted]w[redacted]d 1. Encounter for supervision of other normal pregnancy in second trimester (Primary)  - INTEGRATED 2  2. [redacted] weeks gestation of pregnancy  - INTEGRATED 2  3. History of hypertension Elevated toay PEC labs   4. Anxiety and depression Doing well on Zoloft  50mg   5. History of cesarean section Planning RCS  6. Elevated BP without diagnosis of hypertension No pec s&s, blood work today and follow up BP check Friday Precautions discussed - Comp Met  (CMET) - Protein / creatinine ratio, urine - CBC  7. Tobacco smoking affecting pregnancy in second trimester Discussed risk during pregnancy, encouraged cessation discussed other habits, discussed patch     Preterm labor symptoms and general obstetric precautions including but not limited to vaginal bleeding, contractions, leaking of fluid and fetal movement were reviewed in detail with the patient. Please refer to After Visit Summary for other counseling recommendations.   Return BP check friday  if available if not early next week.  Future Appointments  Date Time Provider Department Center  12/26/2023 11:10 AM [facility] NURSE [facility]  01/14/2024  3:00 PM [facility] US  [facility] None  01/14/2024  3:50 PM Loreli Suzen BIRCH, CNM [facility]  01/15/2024  9:20 AM Zarwolo, Gloria, FNP [facility]    Nidia Daring, FNP

## 2023-12-26 ENCOUNTER — Ambulatory Visit

## 2024-01-01 ENCOUNTER — Other Ambulatory Visit: Payer: Self-pay | Admitting: Obstetrics & Gynecology

## 2024-01-01 DIAGNOSIS — Z363 Encounter for antenatal screening for malformations: Secondary | ICD-10-CM

## 2024-01-14 ENCOUNTER — Other Ambulatory Visit: Payer: Self-pay

## 2024-01-14 ENCOUNTER — Encounter: Payer: Self-pay | Admitting: Advanced Practice Midwife

## 2024-01-15 ENCOUNTER — Ambulatory Visit: Admitting: Family Medicine

## 2024-02-04 ENCOUNTER — Ambulatory Visit: Payer: Self-pay | Admitting: Obstetrics & Gynecology

## 2024-02-04 ENCOUNTER — Encounter: Payer: Self-pay | Admitting: Obstetrics & Gynecology

## 2024-02-04 ENCOUNTER — Ambulatory Visit: Payer: Self-pay

## 2024-02-04 VITALS — BP 123/77 | HR 80 | Wt 202.0 lb

## 2024-02-04 DIAGNOSIS — Z720 Tobacco use: Secondary | ICD-10-CM

## 2024-02-04 DIAGNOSIS — Z98891 History of uterine scar from previous surgery: Secondary | ICD-10-CM

## 2024-02-04 DIAGNOSIS — Z3A23 23 weeks gestation of pregnancy: Secondary | ICD-10-CM

## 2024-02-04 DIAGNOSIS — Z3482 Encounter for supervision of other normal pregnancy, second trimester: Secondary | ICD-10-CM

## 2024-02-04 NOTE — Progress Notes (Signed)
 LOW-RISK PREGNANCY VISIT Patient name: Jillian Mcpherson MRN [id number]  Date of birth: 1991/05/01 Chief Complaint:   Routine Prenatal Visit  History of Present Illness:   Jillian Mcpherson is a 33 y.o. G23P1001 female at [redacted]w[redacted]d with an Estimated Date of Delivery: 05/29/24 being seen today for ongoing management of a low-risk pregnancy.      11/25/2023   10:54 AM 10/10/2023    9:10 AM 10/07/2023    3:23 PM 09/15/2023    3:40 PM 05/19/2023    4:32 PM  Depression screen PHQ 2/9  Decreased Interest 0 1 0 0 0  Down, Depressed, Hopeless 0 1 0 0 0  PHQ - 2 Score 0 2 0 0 0  Altered sleeping 1 0 0  0  Tired, decreased energy 1 1 0  0  Change in appetite 1 1 0  0  Feeling bad or failure about yourself  0 0 0  0  Trouble concentrating 1 1 1   0  Moving slowly or fidgety/restless 0 0 0  0  Suicidal thoughts 0 0 0  0  PHQ-9 Score 4 5 1   0  Difficult doing work/chores  Somewhat difficult   Not difficult at all   Patient had issues with MyChart and was not sure when her anatomy scan was-missed appointment today  Today she reports no complaints. Contractions: Not present.  .  Movement: Present. denies leaking of fluid. Review of Systems:   Pertinent items are noted in HPI Denies abnormal vaginal discharge w/ itching/odor/irritation, headaches, visual changes, shortness of breath, chest pain, abdominal pain, severe nausea/vomiting, or problems with urination or bowel movements unless otherwise stated above. Pertinent History Reviewed:  Reviewed past medical,surgical, social, obstetrical and family history.  Reviewed problem list, medications and allergies.  Physical Assessment:   Vitals:   02/04/24 1019  BP: 123/77  Pulse: 80  Weight: 202 lb (91.6 kg)  Body mass index is 34.67 kg/m.        Physical Examination:   General appearance: Well appearing, and in no distress  Mental status: Alert, oriented to person, place, and time  Skin: Warm & dry  Respiratory: Normal respiratory effort, no  distress  Abdomen: Soft, gravid, nontender  Pelvic: Cervical exam deferred         Extremities: Edema: None  Psych:  mood and affect appropriate  Fetal Status: Fetal Heart Rate (bpm): 140   Movement: Present    Chaperone: n/a    No results found for this or any previous visit (from the past 24 hours).   Assessment & Plan:  1) Low-risk pregnancy G2P1001 at [redacted]w[redacted]d with an Estimated Date of Delivery: 05/29/24   -Prior C-section Briefly discussed risk benefit of vaginal delivery/TOL AC versus C-section VBAC calculator completed, potential success 55% Questions and concerns were reviewed, patient to think about her options   -Anxiety Stable with current medication  -Tobacco use   Meds: No orders of the defined types were placed in this encounter.  Labs/procedures today: doppler  Plan:  Continue routine obstetrical care, PN2 next visit, reschedule anatomy scan Next visit: prefers in person    Reviewed: Preterm labor symptoms and general obstetric precautions including but not limited to vaginal bleeding, contractions, leaking of fluid and fetal movement were reviewed in detail with the patient.  All questions were answered.   Follow-up: Return in about 4 weeks (around 03/03/2024) for rescheduled anatomy next available, 4wk LROB and PN2.  No orders of the defined types were placed in  this encounter.   Lakresha Stifter, DO Attending Obstetrician & Gynecologist, [facility] for [employer], [facility]

## 2024-02-13 ENCOUNTER — Encounter: Payer: Self-pay | Admitting: Obstetrics & Gynecology

## 2024-02-13 ENCOUNTER — Ambulatory Visit (INDEPENDENT_AMBULATORY_CARE_PROVIDER_SITE_OTHER): Payer: Self-pay | Admitting: Obstetrics & Gynecology

## 2024-02-13 ENCOUNTER — Ambulatory Visit (INDEPENDENT_AMBULATORY_CARE_PROVIDER_SITE_OTHER): Payer: Self-pay

## 2024-02-13 VITALS — BP 130/83 | HR 79 | Wt 202.0 lb

## 2024-02-13 DIAGNOSIS — Z363 Encounter for antenatal screening for malformations: Secondary | ICD-10-CM

## 2024-02-13 DIAGNOSIS — Z98891 History of uterine scar from previous surgery: Secondary | ICD-10-CM

## 2024-02-13 DIAGNOSIS — Z3A24 24 weeks gestation of pregnancy: Secondary | ICD-10-CM

## 2024-02-13 DIAGNOSIS — Z3482 Encounter for supervision of other normal pregnancy, second trimester: Secondary | ICD-10-CM

## 2024-02-13 DIAGNOSIS — Z720 Tobacco use: Secondary | ICD-10-CM

## 2024-02-13 NOTE — Progress Notes (Signed)
 US  24+6 wks,cephalic,anterior placenta gr 0,normal ovaries,FHR 151 bpm,SVP of fluid 5.5 cm,CX 3.3 cm,EFW 754 g 44%,anatomy complete,no obvious abnormalities

## 2024-02-13 NOTE — Progress Notes (Signed)
 LOW-RISK PREGNANCY VISIT Patient name: Jillian Mcpherson MRN [id number]  Date of birth: Apr 15, 1991 Chief Complaint:   Routine Prenatal Visit  History of Present Illness:   Jillian Mcpherson is a 33 y.o. G41P1001 female at [redacted]w[redacted]d with an Estimated Date of Delivery: 05/29/24 being seen today for ongoing management of a low-risk pregnancy.   -Prior C-section, desires repeat     11/25/2023   10:54 AM 10/10/2023    9:10 AM 10/07/2023    3:23 PM 09/15/2023    3:40 PM 05/19/2023    4:32 PM  Depression screen PHQ 2/9  Decreased Interest 0 1 0 0 0  Down, Depressed, Hopeless 0 1 0 0 0  PHQ - 2 Score 0 2 0 0 0  Altered sleeping 1 0 0  0  Tired, decreased energy 1 1 0  0  Change in appetite 1 1 0  0  Feeling bad or failure about yourself  0 0 0  0  Trouble concentrating 1 1 1   0  Moving slowly or fidgety/restless 0 0 0  0  Suicidal thoughts 0 0 0  0  PHQ-9 Score 4 5 1   0  Difficult doing work/chores  Somewhat difficult   Not difficult at all    Today she reports no complaints. Contractions: Not present. Vag. Bleeding: None.  Movement: Present. denies leaking of fluid. Review of Systems:   Pertinent items are noted in HPI Denies abnormal vaginal discharge w/ itching/odor/irritation, headaches, visual changes, shortness of breath, chest pain, abdominal pain, severe nausea/vomiting, or problems with urination or bowel movements unless otherwise stated above. Pertinent History Reviewed:  Reviewed past medical,surgical, social, obstetrical and family history.  Reviewed problem list, medications and allergies.  Physical Assessment:   Vitals:   02/13/24 1100  BP: 130/83  Pulse: 79  Weight: 202 lb (91.6 kg)  Body mass index is 34.67 kg/m.        Physical Examination:   General appearance: Well appearing, and in no distress  Mental status: Alert, oriented to person, place, and time  Skin: Warm & dry  Respiratory: Normal respiratory effort, no distress  Abdomen: Soft, gravid, nontender  Pelvic:  Cervical exam deferred         Extremities: Edema: None  Psych:  mood and affect appropriate  Fetal Status:     Movement: Present  cephalic,anterior placenta gr 0,normal ovaries,FHR 151 bpm,SVP of fluid 5.5 cm,CX 3.3 cm,EFW 754 g 44%,anatomy complete,no obvious abnormalities   Chaperone: n/a    No results found for this or any previous visit (from the past 24 hours).   Assessment & Plan:  1) Low-risk pregnancy G2P1001 at [redacted]w[redacted]d with an Estimated Date of Delivery: 05/29/24   -Prior C-section- desires repeat  -normal anatomy scan today -declined flu shot   Meds: No orders of the defined types were placed in this encounter.  Labs/procedures today: anatomy scna  Plan:  Continue routine obstetrical care, PN2 next visit Next visit: prefers in person    Reviewed: Preterm labor symptoms and general obstetric precautions including but not limited to vaginal bleeding, contractions, leaking of fluid and fetal movement were reviewed in detail with the patient.  All questions were answered. Pt has home bp cuff. Check bp weekly, let us  know if >140/90.   Follow-up: Return in about 4 weeks (around 03/12/2024) for LROB visit and PN-2.  No orders of the defined types were placed in this encounter.   Skiler Olden, DO Attending Obstetrician & Gynecologist, [facility]  for [employer], [facility]

## 2024-02-24 ENCOUNTER — Other Ambulatory Visit: Payer: Self-pay

## 2024-02-24 ENCOUNTER — Encounter: Payer: Self-pay | Admitting: Women's Health

## 2024-02-24 ENCOUNTER — Other Ambulatory Visit: Payer: Self-pay | Admitting: Radiology

## 2024-02-24 DIAGNOSIS — Z3A26 26 weeks gestation of pregnancy: Secondary | ICD-10-CM

## 2024-02-24 DIAGNOSIS — Z131 Encounter for screening for diabetes mellitus: Secondary | ICD-10-CM

## 2024-02-24 DIAGNOSIS — Z3482 Encounter for supervision of other normal pregnancy, second trimester: Secondary | ICD-10-CM

## 2024-03-12 ENCOUNTER — Encounter: Payer: Self-pay | Admitting: Obstetrics and Gynecology

## 2024-03-12 ENCOUNTER — Ambulatory Visit: Payer: Self-pay | Admitting: Obstetrics and Gynecology

## 2024-03-12 ENCOUNTER — Other Ambulatory Visit: Payer: Self-pay

## 2024-03-12 VITALS — BP 122/79 | HR 91 | Wt 205.0 lb

## 2024-03-12 DIAGNOSIS — Z3483 Encounter for supervision of other normal pregnancy, third trimester: Secondary | ICD-10-CM

## 2024-03-12 DIAGNOSIS — Z3A28 28 weeks gestation of pregnancy: Secondary | ICD-10-CM

## 2024-03-12 DIAGNOSIS — Z3482 Encounter for supervision of other normal pregnancy, second trimester: Secondary | ICD-10-CM

## 2024-03-12 DIAGNOSIS — Z131 Encounter for screening for diabetes mellitus: Secondary | ICD-10-CM

## 2024-03-12 DIAGNOSIS — Z3481 Encounter for supervision of other normal pregnancy, first trimester: Secondary | ICD-10-CM

## 2024-03-12 DIAGNOSIS — Z720 Tobacco use: Secondary | ICD-10-CM

## 2024-03-12 DIAGNOSIS — Z98891 History of uterine scar from previous surgery: Secondary | ICD-10-CM

## 2024-03-12 NOTE — Progress Notes (Signed)
   PRENATAL VISIT NOTE  Subjective:  Jillian Mcpherson is a 33 y.o. G2P1001 at [redacted]w[redacted]d being seen today for ongoing prenatal care.  She is currently monitored for the following issues for this low-risk pregnancy and has S/P C-section; History of chronic hypertension; Anxiety and depression; Nausea; Lumbar back pain; Tobacco use; Marijuana use; and Supervision of normal pregnancy on their problem list.  Patient reports aching sensation upper abdomen, will eventually go away.  Contractions: Not present.  .  Movement: Present. Denies leaking of fluid.   The following portions of the patient's history were reviewed and updated as appropriate: allergies, current medications, past family history, past medical history, past social history, past surgical history and problem list.   Objective:    Vitals:   03/12/24 0849  BP: 122/79  Pulse: 91  Weight: 205 lb (93 kg)    Fetal Status:  Fetal Heart Rate (bpm): 145 Fundal Height: 28 cm Movement: Present    General: Alert, oriented and cooperative. Patient is in no acute distress.  Skin: Skin is warm and dry. No rash noted.   Cardiovascular: Normal heart rate noted  Respiratory: Normal respiratory effort, no problems with respiration noted  Abdomen: Soft, gravid, appropriate for gestational age.  Pain/Pressure: Absent     Pelvic: Cervical exam deferred        Extremities: Normal range of motion.  Edema: None  Mental Status: Normal mood and affect. Normal behavior. Normal judgment and thought content.   Assessment and Plan:  Pregnancy: G2P1001 at [redacted]w[redacted]d 1. Encounter for supervision of other normal pregnancy in first trimester (Primary) BP and FHR normal Doing well, feeling regular movement    2. [redacted] weeks gestation of pregnancy Rescheduling glucose test, she ate a sandwich at 2am   3. Tobacco use Smoking pack to pack and a half  Discussed risk of smoking in pregnancy Desires cessation, discussed trial of patch   4. History of cesarean  delivery Desires RCS   Preterm labor symptoms and general obstetric precautions including but not limited to vaginal bleeding, contractions, leaking of fluid and fetal movement were reviewed in detail with the patient. Please refer to After Visit Summary for other counseling recommendations.   Rescheduled GTT, then 2 weeks OB visit     Nidia Daring, FNP

## 2024-03-13 MED ORDER — NICOTINE 21 MG/24HR TD PT24: 21.0000 mg | MEDICATED_PATCH | Freq: Every day | TRANSDERMAL | 0 refills | Status: AC

## 2024-03-26 ENCOUNTER — Encounter: Payer: Self-pay | Admitting: Obstetrics & Gynecology

## 2024-03-26 ENCOUNTER — Other Ambulatory Visit: Payer: Self-pay

## 2024-03-26 ENCOUNTER — Ambulatory Visit: Payer: Self-pay | Admitting: Obstetrics & Gynecology

## 2024-03-26 VITALS — BP 125/83 | HR 86 | Wt 206.3 lb

## 2024-03-26 DIAGNOSIS — Z3483 Encounter for supervision of other normal pregnancy, third trimester: Secondary | ICD-10-CM

## 2024-03-26 DIAGNOSIS — Z720 Tobacco use: Secondary | ICD-10-CM

## 2024-03-26 DIAGNOSIS — Z3481 Encounter for supervision of other normal pregnancy, first trimester: Secondary | ICD-10-CM

## 2024-03-26 DIAGNOSIS — Z3A30 30 weeks gestation of pregnancy: Secondary | ICD-10-CM

## 2024-03-26 DIAGNOSIS — Z98891 History of uterine scar from previous surgery: Secondary | ICD-10-CM

## 2024-03-26 NOTE — Progress Notes (Signed)
 HIGH-RISK PREGNANCY VISIT Patient name: Jillian Mcpherson MRN [id number]  Date of birth: Dec 31, 1990 Chief Complaint:   Routine Prenatal Visit (PN2)  History of Present Illness:   Jillian Mcpherson is a 33 y.o. G34P1001 female at [redacted]w[redacted]d with an Estimated Date of Delivery: 05/29/24 being seen today for ongoing management of a high-risk pregnancy complicated by:  -tobacco use -Prior C-section- desires repeat - Today she reports no complaints.   Contractions: Not present.  .  Movement: Present. denies leaking of fluid.      11/25/2023   10:54 AM 10/10/2023    9:10 AM 10/07/2023    3:23 PM 09/15/2023    3:40 PM 05/19/2023    4:32 PM  Depression screen PHQ 2/9  Decreased Interest 0 1 0 0 0  Down, Depressed, Hopeless 0 1 0 0 0  PHQ - 2 Score 0 2 0 0 0  Altered sleeping 1 0 0  0  Tired, decreased energy 1 1 0  0  Change in appetite 1 1 0  0  Feeling bad or failure about yourself  0 0 0  0  Trouble concentrating 1 1 1   0  Moving slowly or fidgety/restless 0 0 0  0  Suicidal thoughts 0 0 0  0  PHQ-9 Score 4  5  1    0   Difficult doing work/chores  Somewhat difficult   Not difficult at all     Data saved with a previous flowsheet row definition     Current Outpatient Medications  Medication Instructions   aspirin  EC 162 mg, Oral, Daily, Swallow whole.   Blood Pressure Monitor DEVI 1 kit, Does not apply, As directed   Blood Pressure Monitor MISC For regular home bp monitoring during pregnancy   Doxylamine -Pyridoxine  (DICLEGIS ) 10-10 MG TBEC 2 tabs q hs, if sx persist add 1 tab q am on day 3, if sx persist add 1 tab q afternoon on day 4   nicotine (NICODERM CQ) 21 mg, Transdermal, Daily, Place 1 patch (21mg  total) on to the skin daily. Rotate patch site daily.   ondansetron  (ZOFRAN  ODT) 4 mg, Oral, Every 8 hours PRN   Prenatal MV & Min w/FA-DHA (PRENATAL GUMMIES PO) Take by mouth.   Prenatal Vit-Fe Phos-FA-Omega (VITAFOL  GUMMIES) 3.33-0.333-34.8 MG CHEW 3 tablets, Oral, Daily   sertraline   (ZOLOFT ) 50 mg, Oral, Daily     Review of Systems:   Pertinent items are noted in HPI Denies abnormal vaginal discharge w/ itching/odor/irritation, headaches, visual changes, shortness of breath, chest pain, abdominal pain, severe nausea/vomiting, or problems with urination or bowel movements unless otherwise stated above. Pertinent History Reviewed:  Reviewed past medical,surgical, social, obstetrical and family history.  Reviewed problem list, medications and allergies. Physical Assessment:   Vitals:   03/26/24 0954  BP: 125/83  Pulse: 86  Weight: 206 lb 4.8 oz (93.6 kg)  Body mass index is 35.41 kg/m.           Physical Examination:   General appearance: alert, well appearing, and in no distress  Mental status: normal mood, behavior, speech, dress, motor activity, and thought processes  Skin: warm & dry   Extremities: Edema: None    Cardiovascular: normal heart rate noted  Respiratory: normal respiratory effort, no distress  Abdomen: gravid, soft, non-tender  Pelvic: Cervical exam deferred         Fetal Status: Fetal Heart Rate (bpm): 145 Fundal Height: 29 cm Movement: Present    Fetal Surveillance Testing today: doppler  Chaperone: N/A    No results found for this or any previous visit (from the past 24 hours).   Assessment & Plan:  High-risk pregnancy: G2P1001 at [redacted]w[redacted]d with an Estimated Date of Delivery: 05/29/24   1) Prior C-section []  planned repeat @ 39wk  2) tobacco use  -Reviewed contraceptive options.  Patient previously had IUD and wishes to return to that method - Discussed placement during surgery versus outpatient.  Patient plans to have it done during her outpatient postpartum visit  Meds: No orders of the defined types were placed in this encounter.   Labs/procedures today: PN2 today  Treatment Plan: Seen OB care  Reviewed: Preterm labor symptoms and general obstetric precautions including but not limited to vaginal bleeding, contractions,  leaking of fluid and fetal movement were reviewed in detail with the patient.  All questions were answered.   Follow-up: Return in about 2 weeks (around 04/09/2024) for LROB visit.   No future appointments.  No orders of the defined types were placed in this encounter.   Nakyla Bracco, DO Attending Obstetrician & Gynecologist, [facility] for [employer], [facility]

## 2024-03-27 LAB — GLUCOSE TOLERANCE, 2 HOURS W/ 1HR
Glucose, 1 hour: 135 mg/dL (ref 70–179)
Glucose, 2 hour: 103 mg/dL (ref 70–152)
Glucose, Fasting: 89 mg/dL (ref 70–91)

## 2024-03-28 ENCOUNTER — Ambulatory Visit: Payer: Self-pay | Admitting: Obstetrics & Gynecology

## 2024-04-12 ENCOUNTER — Encounter: Payer: Self-pay | Admitting: Obstetrics & Gynecology

## 2024-04-22 ENCOUNTER — Ambulatory Visit: Payer: Self-pay | Admitting: Obstetrics & Gynecology

## 2024-04-22 ENCOUNTER — Encounter: Payer: Self-pay | Admitting: Obstetrics & Gynecology

## 2024-04-22 VITALS — BP 114/79 | HR 105 | Wt 207.6 lb

## 2024-04-22 DIAGNOSIS — Z3A34 34 weeks gestation of pregnancy: Secondary | ICD-10-CM

## 2024-04-22 DIAGNOSIS — F419 Anxiety disorder, unspecified: Secondary | ICD-10-CM

## 2024-04-22 DIAGNOSIS — Z720 Tobacco use: Secondary | ICD-10-CM

## 2024-04-22 DIAGNOSIS — Z3483 Encounter for supervision of other normal pregnancy, third trimester: Secondary | ICD-10-CM

## 2024-04-22 DIAGNOSIS — Z98891 History of uterine scar from previous surgery: Secondary | ICD-10-CM

## 2024-04-22 NOTE — Progress Notes (Signed)
 HIGH-RISK PREGNANCY VISIT Patient name: Jillian Mcpherson MRN [id number]  Date of birth: 12-Oct-1990 Chief Complaint:   Routine Prenatal Visit  History of Present Illness:   Jillian Mcpherson is a 33 y.o. G55P1001 female at [redacted]w[redacted]d with an Estimated Date of Delivery: 05/29/24 being seen today for ongoing management of a high-risk pregnancy complicated by:  - Prior C-section desires repeat - Tobacco use - Anxiety depression: On Zoloft  -Nausea-due to lapse with insurance currently is paying out-of-pocket and not able to pick up prescription.  Today she reports occasional contractions.   Contractions: Not present. Vag. Bleeding: None.  Movement: Present. denies leaking of fluid.      11/25/2023   10:54 AM 10/10/2023    9:10 AM 10/07/2023    3:23 PM 09/15/2023    3:40 PM 05/19/2023    4:32 PM  Depression screen PHQ 2/9  Decreased Interest 0 1 0 0 0  Down, Depressed, Hopeless 0 1 0 0 0  PHQ - 2 Score 0 2 0 0 0  Altered sleeping 1 0 0  0  Tired, decreased energy 1 1 0  0  Change in appetite 1 1 0  0  Feeling bad or failure about yourself  0 0 0  0  Trouble concentrating 1 1 1   0  Moving slowly or fidgety/restless 0 0 0  0  Suicidal thoughts 0 0 0  0  PHQ-9 Score 4  5  1    0   Difficult doing work/chores  Somewhat difficult   Not difficult at all     Data saved with a previous flowsheet row definition     Current Outpatient Medications  Medication Instructions   aspirin  EC 162 mg, Oral, Daily, Swallow whole.   Blood Pressure Monitor DEVI 1 kit, Does not apply, As directed   Blood Pressure Monitor MISC For regular home bp monitoring during pregnancy   Doxylamine -Pyridoxine  (DICLEGIS ) 10-10 MG TBEC 2 tabs q hs, if sx persist add 1 tab q am on day 3, if sx persist add 1 tab q afternoon on day 4   nicotine  (NICODERM CQ ) 21 mg, Transdermal, Daily, Place 1 patch (21mg  total) on to the skin daily. Rotate patch site daily.   ondansetron  (ZOFRAN  ODT) 4 mg, Oral, Every 8 hours PRN   Prenatal MV & Min  w/FA-DHA (PRENATAL GUMMIES PO) Take by mouth.   Prenatal Vit-Fe Phos-FA-Omega (VITAFOL  GUMMIES) 3.33-0.333-34.8 MG CHEW 3 tablets, Oral, Daily   sertraline  (ZOLOFT ) 50 mg, Oral, Daily     Review of Systems:   Pertinent items are noted in HPI Denies abnormal vaginal discharge w/ itching/odor/irritation, headaches, visual changes, shortness of breath, chest pain, abdominal pain, severe nausea/vomiting, or problems with urination or bowel movements unless otherwise stated above. Pertinent History Reviewed:  Reviewed past medical,surgical, social, obstetrical and family history.  Reviewed problem list, medications and allergies. Physical Assessment:   Vitals:   04/22/24 1619  BP: 114/79  Pulse: (!) 105  Weight: 207 lb 9.6 oz (94.2 kg)  Body mass index is 35.63 kg/m.           Physical Examination:   General appearance: alert, well appearing, and in no distress  Mental status: normal mood, behavior, speech, dress, motor activity, and thought processes  Skin: warm & dry   Extremities:      Cardiovascular: normal heart rate noted  Respiratory: normal respiratory effort, no distress  Abdomen: gravid, soft, non-tender  Pelvic: Cervical exam deferred  Fetal Status: Fetal Heart Rate (bpm): 32 Fundal Height: 135 cm Movement: Present    Fetal Surveillance Testing today: doppler   Chaperone: N/A    No results found for this or any previous visit (from the past 24 hours).   Assessment & Plan:  High-risk pregnancy: G2P1001 at [redacted]w[redacted]d with an Estimated Date of Delivery: 05/29/24   - Prior C-section- desires repeat Referral sent in for Jan 5 - Tobacco use - Anxiety depression: On Zoloft  -Nausea  Discussed over-the-counter [employer]is[employer]  and vitamin B6 as well as other herbal remedies  Meds: No orders of the defined types were placed in this encounter.   Labs/procedures today: doppler  Treatment Plan:  routine OB care and as outlined above  Reviewed: Preterm labor symptoms and  general obstetric precautions including but not limited to vaginal bleeding, contractions, leaking of fluid and fetal movement were reviewed in detail with the patient.  All questions were answered.   Follow-up: Return in about 2 weeks (around 05/06/2024) for LROB visit.   Future Appointments  Date Time Provider Department Center  04/28/2024  4:10 PM Kizzie Suzen SAUNDERS, [place] [facility]  05/05/2024  2:50 PM Loreli Suzen BIRCH, CNM [facility]  05/10/2024  3:50 PM Newton Mering, CNM [facility]  05/18/2024  2:50 PM Kizzie Suzen SAUNDERS, CNM [facility]    No orders of the defined types were placed in this encounter.   Brody Kump, DO Attending Obstetrician & Gynecologist, [facility] for [employer], [facility]

## 2024-04-28 ENCOUNTER — Encounter: Payer: Self-pay | Admitting: Women's Health

## 2024-05-05 ENCOUNTER — Encounter: Payer: Self-pay | Admitting: Advanced Practice Midwife

## 2024-05-10 ENCOUNTER — Encounter: Payer: Self-pay | Admitting: Advanced Practice Midwife

## 2024-05-11 ENCOUNTER — Telehealth (HOSPITAL_COMMUNITY): Payer: Self-pay | Admitting: *Deleted

## 2024-05-11 ENCOUNTER — Encounter (HOSPITAL_COMMUNITY): Payer: Self-pay

## 2024-05-11 NOTE — Patient Instructions (Signed)
"   Levora R Spanbauer  05/11/2024   Your procedure is scheduled on:  05/24/2024  Arrive at 1215 at [employer] [facility] at [facility]  and [employer]. You are invited to use the FREE valet parking or use the Visitor's parking deck.  Pick up the phone at the desk and dial [phone].  Call this number if you have problems the morning of surgery: [phone]  Remember:   Do not eat food:(After Midnight) Desps de medianoche.  You may drink clear liquids until  __1015___.  Clear liquids means a liquid you can see thru.  It can have color such as Cola or Kool aid.  Tea is OK and coffee as long as no milk or creamer of any kind.  Take these medicines the morning of surgery with A SIP OF WATER:  none   Do not wear jewelry, make-up or nail polish.  Do not wear lotions, powders, or perfumes. Do not wear deodorant.  Do not shave 48 hours prior to surgery.  Do not bring valuables to the hospital.  [facility] is not   responsible for any belongings or valuables brought to the hospital.  Contacts, dentures or bridgework may not be worn into surgery.  Leave suitcase in the car. After surgery it may be brought to your room.  For patients admitted to the hospital, checkout time is 11:00 AM the day of              discharge.      Please read over the following fact sheets that you were given:     Preparing for Surgery   "

## 2024-05-11 NOTE — Telephone Encounter (Signed)
 Preadmission screen

## 2024-05-14 ENCOUNTER — Telehealth (HOSPITAL_COMMUNITY): Payer: Self-pay | Admitting: *Deleted

## 2024-05-14 NOTE — Telephone Encounter (Signed)
 Preadmission screen

## 2024-05-18 ENCOUNTER — Ambulatory Visit: Payer: Self-pay | Admitting: Women's Health

## 2024-05-18 ENCOUNTER — Encounter (HOSPITAL_COMMUNITY): Payer: Self-pay

## 2024-05-18 ENCOUNTER — Other Ambulatory Visit (HOSPITAL_COMMUNITY)
Admission: RE | Admit: 2024-05-18 | Discharge: 2024-05-18 | Disposition: A | Discharge status: Home / Self Care | Payer: Self-pay | Source: Ambulatory Visit | Attending: Women's Health | Admitting: Women's Health

## 2024-05-18 ENCOUNTER — Encounter: Payer: Self-pay | Admitting: Women's Health

## 2024-05-18 ENCOUNTER — Other Ambulatory Visit: Payer: Self-pay | Admitting: Family Medicine

## 2024-05-18 VITALS — BP 126/84 | HR 86 | Wt 210.6 lb

## 2024-05-18 DIAGNOSIS — Z3A38 38 weeks gestation of pregnancy: Secondary | ICD-10-CM

## 2024-05-18 DIAGNOSIS — Z3483 Encounter for supervision of other normal pregnancy, third trimester: Secondary | ICD-10-CM | POA: Insufficient documentation

## 2024-05-18 DIAGNOSIS — O09523 Supervision of elderly multigravida, third trimester: Secondary | ICD-10-CM

## 2024-05-18 DIAGNOSIS — O34219 Maternal care for unspecified type scar from previous cesarean delivery: Secondary | ICD-10-CM

## 2024-05-18 DIAGNOSIS — Z98891 History of uterine scar from previous surgery: Secondary | ICD-10-CM

## 2024-05-18 NOTE — Progress Notes (Signed)
 "   LOW-RISK PREGNANCY VISIT Patient name: Jillian Mcpherson MRN [id number]  Date of birth: [birth year] Chief Complaint:   Routine Prenatal Visit  History of Present Illness:   Jillian Mcpherson is a 33 y.o. G31P1001 female at [redacted]w[redacted]d with an Estimated Date of Delivery: 05/29/24 being seen today for ongoing management of a low-risk pregnancy.   Today she reports pelvic pressure. Contractions: Not present. Vag. Bleeding: None.  Movement: Present. denies leaking of fluid.     11/25/2023   10:54 AM 10/10/2023    9:10 AM 10/07/2023    3:23 PM 09/15/2023    3:40 PM 05/19/2023    4:32 PM  Depression screen PHQ 2/9  Decreased Interest 0 1 0 0 0  Down, Depressed, Hopeless 0 1 0 0 0  PHQ - 2 Score 0 2 0 0 0  Altered sleeping 1 0 0  0  Tired, decreased energy 1 1 0  0  Change in appetite 1 1 0  0  Feeling bad or failure about yourself  0 0 0  0  Trouble concentrating 1 1 1   0  Moving slowly or fidgety/restless 0 0 0  0  Suicidal thoughts 0 0 0  0  PHQ-9 Score 4  5  1    0   Difficult doing work/chores  Somewhat difficult   Not difficult at all     Data saved with a previous flowsheet row definition        11/25/2023   10:55 AM 10/10/2023    9:10 AM 10/07/2023    3:23 PM 05/19/2023    4:32 PM  GAD 7 : Generalized Anxiety Score  Nervous, Anxious, on Edge 1 3 2  0  Control/stop worrying 1 3 2  0  Worry too much - different things 1 3 2  0  Trouble relaxing 1 3 2  0  Restless 0 0 0 0  Easily annoyed or irritable 1 3 2  0  Afraid - awful might happen 1 3 2  0  Total GAD 7 Score 6 18 12  0  Anxiety Difficulty  Somewhat difficult  Not difficult at all      Review of Systems:   Pertinent items are noted in HPI Denies abnormal vaginal discharge w/ itching/odor/irritation, headaches, visual changes, shortness of breath, chest pain, abdominal pain, severe nausea/vomiting, or problems with urination or bowel movements unless otherwise stated above. Pertinent History Reviewed:  Reviewed past medical,surgical,  social, obstetrical and family history.  Reviewed problem list, medications and allergies. Physical Assessment:   Vitals:   05/18/24 1456  BP: 126/84  Pulse: 86  Weight: 210 lb 9.6 oz (95.5 kg)  Body mass index is 36.15 kg/m.        Physical Examination:   General appearance: Well appearing, and in no distress  Mental status: Alert, oriented to person, place, and time  Skin: Warm & dry  Cardiovascular: Normal heart rate noted  Respiratory: Normal respiratory effort, no distress  Abdomen: Soft, gravid, nontender  Pelvic: Cervical exam performed  Dilation: 1 Effacement (%): 80 Station: -2  Extremities:    Fetal Status: Fetal Heart Rate (bpm): 153 Fundal Height: 37 cm Movement: Present Presentation: Vertex  Chaperone: Alan Fischer No results found for this or any previous visit (from the past 24 hours).  Assessment & Plan:  1) Low-risk pregnancy G2P1001 at [redacted]w[redacted]d with an Estimated Date of Delivery: 05/29/24   2) Prev c/s, for RCS 1/5   Meds: No orders of the defined types were placed in this  encounter.  Labs/procedures today: GBS, GC/CT, and SVE  Reviewed: Term labor symptoms and general obstetric precautions including but not limited to vaginal bleeding, contractions, leaking of fluid and fetal movement were reviewed in detail with the patient.  All questions were answered. Does have home bp cuff. Office bp cuff given: not applicable. Check bp weekly, let us  know if consistently >140 and/or >90.  Follow-up: Return for 1/12 for incision check.  Future Appointments  Date Time Provider Department Center  05/21/2024 10:00 AM [facility] PAT 1 [facility] None    Orders Placed This Encounter  Procedures   Culture, beta strep (group b only)   Suzen JONELLE Fetters CNM, [facility] 05/18/2024 3:33 PM  "

## 2024-05-18 NOTE — Patient Instructions (Signed)
Jillian Mcpherson, thank you for choosing our office today! We appreciate the opportunity to meet your healthcare needs. You may receive a short survey by mail, e-mail, or through MyChart. If you are happy with your care we would appreciate if you could take just a few minutes to complete the survey questions. We read all of your comments and take your feedback very seriously. Thank you again for choosing our office.  [facility]  [facility] ([address] [place], [place] [address]) Entrance C, located off of [address] Free 24/7 valet parking   CLASSES: Go to [email or web address] to register for classes (childbirth, breastfeeding, waterbirth, infant CPR, daddy bootcamp, etc.)  Call the office ([phone]) or go to [facility] if: You begin to have strong, frequent contractions Your water breaks.  Sometimes it is a big gush of fluid, sometimes it is just a trickle that keeps getting your panties wet or running down your legs You have vaginal bleeding.  It is normal to have a small amount of spotting if your cervix was checked.  You don't feel your baby moving like normal.  If you don't, get you something to eat and drink and lay down and focus on feeling your baby move.   If your baby is still not moving like normal, you should call the office or go to [facility].  Call the office ([phone]) or go to [facility] for these signs of pre-eclampsia: Severe headache that does not go away with Tylenol Visual changes- seeing spots, double, blurred vision Pain under your right breast or upper abdomen that does not go away with Tums or heartburn medicine Nausea and/or vomiting Severe swelling in your hands, feet, and face   [place] Pediatricians/Family Doctors [facility] ([facility]): [address], [phone]           [facility]: [address], [phone]                 [facility] ([facility]): [address], [phone] (call to ask if accepting patients) [facility]: [address], [place], [phone]    [place] Pediatricians/Family Doctors [facility] ([facility]): [address], [phone] [facility]: [address], [phone] [facility]: [address], [phone]  [place] [facility]  [facility] ([facility]): [phone] [facility]: [address], [phone]   [place] [facility]: [address], [phone]  [place] [facility]  [facility]: [address], [phone]  Home Blood Pressure Monitoring for Patients   Your provider has recommended that you check your blood pressure (BP) at least once a week at home. If you do not have a blood pressure cuff at home, one will be provided for you. Contact your provider if you have not received your monitor within 1 week.   Helpful Tips for Accurate Home Blood Pressure Checks  Don't smoke, exercise, or drink caffeine 30 minutes before checking your BP Use the restroom before checking your BP (a full bladder can raise your pressure) Relax in a comfortable upright chair Feet on the ground Left arm resting comfortably on a flat surface at the level of your heart Legs uncrossed Back supported Sit quietly and don't talk Place the cuff on your bare arm Adjust snuggly, so that only two fingertips   can fit between your skin and the top of the cuff Check 2 readings separated by at least one minute Keep a log of your BP readings For a visual, please reference this diagram: http://ccnc.care/bpdiagram  Provider Name: [facility]     Phone: [phone]  Zone 1: ALL CLEAR  Continue to monitor your symptoms:  BP reading is less than 140 (top number) or less than 90 (bottom number)  No right  upper stomach pain No headaches or seeing spots No feeling nauseated or throwing up No swelling in face and hands  Zone 2: CAUTION Call your doctor's office for any of the following:  BP reading is greater than 140 (top number) or greater than 90 (bottom number)  Stomach pain under your ribs in the middle or right side Headaches or seeing spots Feeling nauseated or throwing up Swelling in face and hands  Zone 3: EMERGENCY  Seek immediate medical care if you have any of the following:  BP reading is greater than160 (top number) or greater than 110 (bottom number) Severe headaches not improving with Tylenol Serious difficulty catching your breath Any worsening symptoms from Zone 2   Braxton Hicks Contractions Contractions of the uterus can occur throughout pregnancy, but they are not always a sign that you are in labor. You may have practice contractions called Braxton Hicks contractions. These false labor contractions are sometimes confused with true labor. What are Braxton Hicks contractions? Braxton Hicks contractions are tightening movements that occur in the muscles of the uterus before labor. Unlike true labor contractions, these contractions do not result in opening (dilation) and thinning of the cervix. Toward the end of pregnancy (32-34 weeks), Braxton Hicks contractions can happen more often and may become stronger. These contractions are sometimes difficult to tell apart from true labor because they can be very uncomfortable. You should not feel embarrassed if you go to the hospital with false labor. Sometimes, the only way to tell if you are in true labor is for your health care provider to look for changes in the cervix. The health care provider will do a physical exam and may monitor your contractions. If you are not in true labor, the exam should show that your cervix is not dilating and your water has not broken. If there are no other health problems associated with your  pregnancy, it is completely safe for you to be sent home with false labor. You may continue to have Braxton Hicks contractions until you go into true labor. How to tell the difference between true labor and false labor True labor Contractions last 30-70 seconds. Contractions become very regular. Discomfort is usually felt in the top of the uterus, and it spreads to the lower abdomen and low back. Contractions do not go away with walking. Contractions usually become more intense and increase in frequency. The cervix dilates and gets thinner. False labor Contractions are usually shorter and not as strong as true labor contractions. Contractions are usually irregular. Contractions are often felt in the front of the lower abdomen and in the groin. Contractions may go away when you walk around or change positions while lying down. Contractions get weaker and are shorter-lasting as time goes on. The cervix usually does not dilate or become thin. Follow these instructions at home:  Take over-the-counter and prescription medicines only as told by your health care provider. Keep up with your usual exercises and follow other instructions from your health care provider. Eat and drink lightly if you think   you are going into labor. If Braxton Hicks contractions are making you uncomfortable: Change your position from lying down or resting to walking, or change from walking to resting. Sit and rest in a tub of warm water. Drink enough fluid to keep your urine pale yellow. Dehydration may cause these contractions. Do slow and deep breathing several times an hour. Keep all follow-up prenatal visits as told by your health care provider. This is important. Contact a health care provider if: You have a fever. You have continuous pain in your abdomen. Get help right away if: Your contractions become stronger, more regular, and closer together. You have fluid leaking or gushing from your vagina. You pass  blood-tinged mucus (bloody show). You have bleeding from your vagina. You have low back pain that you never had before. You feel your baby's head pushing down and causing pelvic pressure. Your baby is not moving inside you as much as it used to. Summary Contractions that occur before labor are called Braxton Hicks contractions, false labor, or practice contractions. Braxton Hicks contractions are usually shorter, weaker, farther apart, and less regular than true labor contractions. True labor contractions usually become progressively stronger and regular, and they become more frequent. Manage discomfort from Braxton Hicks contractions by changing position, resting in a warm bath, drinking plenty of water, or practicing deep breathing. This information is not intended to replace advice given to you by your health care provider. Make sure you discuss any questions you have with your health care provider. Document Revised: 04/18/2017 Document Reviewed: 09/19/2016 Elsevier Patient Education  2020 Elsevier Inc.   

## 2024-05-21 ENCOUNTER — Encounter (HOSPITAL_COMMUNITY)
Admission: RE | Admit: 2024-05-21 | Discharge: 2024-05-21 | Disposition: A | Discharge status: Home / Self Care | Payer: Self-pay | Source: Ambulatory Visit | Attending: Family Medicine | Admitting: Family Medicine

## 2024-05-21 DIAGNOSIS — Z01812 Encounter for preprocedural laboratory examination: Secondary | ICD-10-CM | POA: Insufficient documentation

## 2024-05-21 DIAGNOSIS — Z98891 History of uterine scar from previous surgery: Secondary | ICD-10-CM | POA: Insufficient documentation

## 2024-05-21 LAB — CERVICOVAGINAL ANCILLARY ONLY
Chlamydia: NEGATIVE
Comment: NEGATIVE
Comment: NORMAL
Neisseria Gonorrhea: NEGATIVE

## 2024-05-21 LAB — CBC
HCT: 37.6 % (ref 36.0–46.0)
Hemoglobin: 12.5 g/dL (ref 12.0–15.0)
MCH: 28.6 pg (ref 26.0–34.0)
MCHC: 33.2 g/dL (ref 30.0–36.0)
MCV: 86 fL (ref 80.0–100.0)
Platelets: 443 K/uL — ABNORMAL HIGH (ref 150–400)
RBC: 4.37 MIL/uL (ref 3.87–5.11)
RDW: 14.3 % (ref 11.5–15.5)
WBC: 18.4 K/uL — ABNORMAL HIGH (ref 4.0–10.5)
nRBC: 0 % (ref 0.0–0.2)

## 2024-05-21 LAB — TYPE AND SCREEN
ABO/RH(D): A POS
Antibody Screen: NEGATIVE

## 2024-05-21 LAB — SYPHILIS: RPR W/REFLEX TO RPR TITER AND TREPONEMAL ANTIBODIES, TRADITIONAL SCREENING AND DIAGNOSIS ALGORITHM: RPR Ser Ql: NONREACTIVE

## 2024-05-21 LAB — CULTURE, BETA STREP (GROUP B ONLY): Strep Gp B Culture: POSITIVE — AB

## 2024-05-24 ENCOUNTER — Inpatient Hospital Stay (HOSPITAL_COMMUNITY): Payer: MEDICAID | Admitting: Anesthesiology

## 2024-05-24 ENCOUNTER — Encounter (HOSPITAL_COMMUNITY): Payer: Self-pay | Admitting: Family Medicine

## 2024-05-24 ENCOUNTER — Encounter (HOSPITAL_COMMUNITY): Admission: RE | Payer: Self-pay

## 2024-05-24 ENCOUNTER — Other Ambulatory Visit: Payer: Self-pay

## 2024-05-24 ENCOUNTER — Inpatient Hospital Stay (HOSPITAL_COMMUNITY)
Admission: RE | Admit: 2024-05-24 | Discharge: 2024-05-26 | DRG: 788 | Disposition: A | Discharge status: Home / Self Care | Payer: MEDICAID | Attending: Family Medicine | Admitting: Family Medicine

## 2024-05-24 DIAGNOSIS — Z3490 Encounter for supervision of normal pregnancy, unspecified, unspecified trimester: Principal | ICD-10-CM

## 2024-05-24 DIAGNOSIS — O165 Unspecified maternal hypertension, complicating the puerperium: Secondary | ICD-10-CM | POA: Diagnosis present

## 2024-05-24 DIAGNOSIS — Z98891 History of uterine scar from previous surgery: Principal | ICD-10-CM

## 2024-05-24 DIAGNOSIS — O99820 Streptococcus B carrier state complicating pregnancy: Secondary | ICD-10-CM

## 2024-05-24 DIAGNOSIS — F1290 Cannabis use, unspecified, uncomplicated: Secondary | ICD-10-CM

## 2024-05-24 DIAGNOSIS — Z3A39 39 weeks gestation of pregnancy: Secondary | ICD-10-CM

## 2024-05-24 DIAGNOSIS — O99334 Smoking (tobacco) complicating childbirth: Secondary | ICD-10-CM | POA: Diagnosis present

## 2024-05-24 DIAGNOSIS — O99324 Drug use complicating childbirth: Secondary | ICD-10-CM

## 2024-05-24 DIAGNOSIS — O99214 Obesity complicating childbirth: Secondary | ICD-10-CM | POA: Diagnosis present

## 2024-05-24 DIAGNOSIS — F17210 Nicotine dependence, cigarettes, uncomplicated: Secondary | ICD-10-CM | POA: Diagnosis present

## 2024-05-24 DIAGNOSIS — Z8249 Family history of ischemic heart disease and other diseases of the circulatory system: Secondary | ICD-10-CM

## 2024-05-24 DIAGNOSIS — Z720 Tobacco use: Secondary | ICD-10-CM | POA: Diagnosis present

## 2024-05-24 DIAGNOSIS — O34211 Maternal care for low transverse scar from previous cesarean delivery: Secondary | ICD-10-CM

## 2024-05-24 SURGERY — Surgical Case
Anesthesia: Spinal | Site: Abdomen

## 2024-05-24 MED ORDER — DIPHENHYDRAMINE HCL 25 MG PO CAPS: 25.0000 mg | ORAL_CAPSULE | Freq: Four times a day (QID) | ORAL | Status: DC | PRN

## 2024-05-24 MED ORDER — LACTATED RINGERS IV SOLN: INTRAVENOUS | Status: DC

## 2024-05-24 MED ORDER — ACETAMINOPHEN 10 MG/ML IV SOLN
INTRAVENOUS | Status: AC
  Filled 2024-05-24: qty 100

## 2024-05-24 MED ORDER — SCOPOLAMINE 1 MG/3DAYS TD PT72
MEDICATED_PATCH | TRANSDERMAL | Status: AC
  Filled 2024-05-24: qty 1

## 2024-05-24 MED ORDER — SOD CITRATE-CITRIC ACID 500-334 MG/5ML PO SOLN
ORAL | Status: AC
  Filled 2024-05-24: qty 30

## 2024-05-24 MED ORDER — IBUPROFEN 600 MG PO TABS
600.0000 mg | ORAL_TABLET | Freq: Four times a day (QID) | ORAL | Status: DC
  Administered 2024-05-25 – 2024-05-26 (×4): 600 mg via ORAL
  Filled 2024-05-24 (×4): qty 1

## 2024-05-24 MED ORDER — FAMOTIDINE 20 MG PO TABS
20.0000 mg | ORAL_TABLET | Freq: Once | ORAL | Status: AC
  Administered 2024-05-24: 20 mg via ORAL

## 2024-05-24 MED ORDER — SIMETHICONE 80 MG PO CHEW
80.0000 mg | CHEWABLE_TABLET | Freq: Three times a day (TID) | ORAL | Status: DC
  Administered 2024-05-24 – 2024-05-26 (×6): 80 mg via ORAL
  Filled 2024-05-24 (×6): qty 1

## 2024-05-24 MED ORDER — ACETAMINOPHEN 500 MG PO TABS: 1000.0000 mg | ORAL_TABLET | Freq: Four times a day (QID) | ORAL | Status: DC

## 2024-05-24 MED ORDER — SERTRALINE HCL 50 MG PO TABS
50.0000 mg | ORAL_TABLET | Freq: Every day | ORAL | Status: DC
  Administered 2024-05-24 – 2024-05-25 (×2): 50 mg via ORAL
  Filled 2024-05-24 (×2): qty 1

## 2024-05-24 MED ORDER — PHENYLEPHRINE HCL-NACL 20-0.9 MG/250ML-% IV SOLN
INTRAVENOUS | Status: DC | PRN
  Administered 2024-05-24: 60 ug/min via INTRAVENOUS

## 2024-05-24 MED ORDER — NICOTINE 21 MG/24HR TD PT24
21.0000 mg | MEDICATED_PATCH | Freq: Every day | TRANSDERMAL | Status: DC
  Administered 2024-05-24 – 2024-05-26 (×3): 21 mg via TRANSDERMAL
  Filled 2024-05-24 (×3): qty 1

## 2024-05-24 MED ORDER — PHENYLEPHRINE HCL-NACL 20-0.9 MG/250ML-% IV SOLN
INTRAVENOUS | Status: AC
  Filled 2024-05-24: qty 250

## 2024-05-24 MED ORDER — SODIUM CHLORIDE 0.9% FLUSH: 3.0000 mL | INTRAVENOUS | Status: DC | PRN

## 2024-05-24 MED ORDER — INFLUENZA VIRUS VACC SPLIT PF (FLUZONE) 0.5 ML IM SUSY: 0.5000 mL | PREFILLED_SYRINGE | INTRAMUSCULAR | Status: DC

## 2024-05-24 MED ORDER — OXYTOCIN-SODIUM CHLORIDE 30-0.9 UT/500ML-% IV SOLN
INTRAVENOUS | Status: AC
  Filled 2024-05-24: qty 500

## 2024-05-24 MED ORDER — SCOPOLAMINE 1 MG/3DAYS TD PT72: 1.0000 | MEDICATED_PATCH | Freq: Once | TRANSDERMAL | Status: DC

## 2024-05-24 MED ORDER — ONDANSETRON HCL 4 MG/2ML IJ SOLN
INTRAMUSCULAR | Status: AC
  Filled 2024-05-24: qty 2

## 2024-05-24 MED ORDER — FENTANYL CITRATE (PF) 100 MCG/2ML IJ SOLN
INTRAMUSCULAR | Status: DC | PRN
  Administered 2024-05-24: 15 ug via INTRATHECAL

## 2024-05-24 MED ORDER — MENTHOL 3 MG MT LOZG: 1.0000 | LOZENGE | OROMUCOSAL | Status: DC | PRN

## 2024-05-24 MED ORDER — BUPIVACAINE IN DEXTROSE 0.75-8.25 % IT SOLN
INTRATHECAL | Status: DC | PRN
  Administered 2024-05-24: 1.6 mL via INTRATHECAL

## 2024-05-24 MED ORDER — SOD CITRATE-CITRIC ACID 500-334 MG/5ML PO SOLN
30.0000 mL | Freq: Once | ORAL | Status: AC
  Administered 2024-05-24: 30 mL via ORAL

## 2024-05-24 MED ORDER — CEFAZOLIN SODIUM-DEXTROSE 2-4 GM/100ML-% IV SOLN
2.0000 g | INTRAVENOUS | Status: AC
  Administered 2024-05-24: 2 g via INTRAVENOUS

## 2024-05-24 MED ORDER — DIPHENHYDRAMINE HCL 50 MG/ML IJ SOLN
INTRAMUSCULAR | Status: AC
  Filled 2024-05-24: qty 1

## 2024-05-24 MED ORDER — FAMOTIDINE 20 MG PO TABS
ORAL_TABLET | ORAL | Status: AC
  Filled 2024-05-24: qty 1

## 2024-05-24 MED ORDER — POVIDONE-IODINE 10 % EX SWAB
2.0000 | Freq: Once | CUTANEOUS | Status: AC
  Administered 2024-05-24: 2 via TOPICAL

## 2024-05-24 MED ORDER — MEASLES, MUMPS & RUBELLA VAC ~~LOC~~ SUSR: 0.5000 mL | Freq: Once | SUBCUTANEOUS | Status: DC

## 2024-05-24 MED ORDER — CHLORHEXIDINE GLUCONATE 0.12 % MT SOLN
OROMUCOSAL | Status: AC
  Filled 2024-05-24: qty 15

## 2024-05-24 MED ORDER — FENTANYL CITRATE (PF) 100 MCG/2ML IJ SOLN: 25.0000 ug | INTRAMUSCULAR | Status: DC | PRN

## 2024-05-24 MED ORDER — PRENATAL MULTIVITAMIN CH
1.0000 | ORAL_TABLET | Freq: Every day | ORAL | Status: DC
  Administered 2024-05-24 – 2024-05-25 (×2): 1 via ORAL
  Filled 2024-05-24 (×2): qty 1

## 2024-05-24 MED ORDER — KETOROLAC TROMETHAMINE 30 MG/ML IJ SOLN
30.0000 mg | Freq: Four times a day (QID) | INTRAMUSCULAR | Status: AC
  Administered 2024-05-24 – 2024-05-25 (×4): 30 mg via INTRAVENOUS
  Filled 2024-05-24 (×4): qty 1

## 2024-05-24 MED ORDER — DEXAMETHASONE SOD PHOSPHATE PF 10 MG/ML IJ SOLN
INTRAMUSCULAR | Status: DC | PRN
  Administered 2024-05-24 (×2): 5 mg via INTRAVENOUS

## 2024-05-24 MED ORDER — ENOXAPARIN SODIUM 60 MG/0.6ML IJ SOSY
50.0000 mg | PREFILLED_SYRINGE | INTRAMUSCULAR | Status: DC
  Administered 2024-05-25 – 2024-05-26 (×2): 50 mg via SUBCUTANEOUS
  Filled 2024-05-24 (×2): qty 0.6

## 2024-05-24 MED ORDER — OXYTOCIN-SODIUM CHLORIDE 30-0.9 UT/500ML-% IV SOLN
INTRAVENOUS | Status: DC | PRN
  Administered 2024-05-24: 999 mL/h via INTRAVENOUS

## 2024-05-24 MED ORDER — DIBUCAINE (PERIANAL) 1 % EX OINT: 1.0000 | TOPICAL_OINTMENT | CUTANEOUS | Status: DC | PRN

## 2024-05-24 MED ORDER — SODIUM CHLORIDE 0.9 % IR SOLN
Status: DC | PRN
  Administered 2024-05-24: 1

## 2024-05-24 MED ORDER — OXYCODONE HCL 5 MG PO TABS: 5.0000 mg | ORAL_TABLET | ORAL | Status: DC | PRN

## 2024-05-24 MED ORDER — SIMETHICONE 80 MG PO CHEW: 80.0000 mg | CHEWABLE_TABLET | ORAL | Status: DC | PRN

## 2024-05-24 MED ORDER — CEFAZOLIN SODIUM-DEXTROSE 2-4 GM/100ML-% IV SOLN
INTRAVENOUS | Status: AC
  Filled 2024-05-24: qty 100

## 2024-05-24 MED ORDER — FENTANYL CITRATE (PF) 100 MCG/2ML IJ SOLN
INTRAMUSCULAR | Status: AC
  Filled 2024-05-24: qty 2

## 2024-05-24 MED ORDER — SCOPOLAMINE 1 MG/3DAYS TD PT72
1.0000 | MEDICATED_PATCH | Freq: Once | TRANSDERMAL | Status: DC
  Administered 2024-05-24: 1 mg via TRANSDERMAL

## 2024-05-24 MED ORDER — NALOXONE HCL 0.4 MG/ML IJ SOLN: 0.4000 mg | INTRAMUSCULAR | Status: DC | PRN

## 2024-05-24 MED ORDER — COCONUT OIL OIL: 1.0000 | TOPICAL_OIL | Status: DC | PRN

## 2024-05-24 MED ORDER — KETOROLAC TROMETHAMINE 30 MG/ML IJ SOLN: 30.0000 mg | Freq: Four times a day (QID) | INTRAMUSCULAR | Status: DC | PRN

## 2024-05-24 MED ORDER — NALOXONE HCL 4 MG/10ML IJ SOLN: 1.0000 ug/kg/h | INTRAVENOUS | Status: DC | PRN

## 2024-05-24 MED ORDER — MORPHINE SULFATE (PF) 0.5 MG/ML IJ SOLN
INTRAMUSCULAR | Status: DC | PRN
  Administered 2024-05-24: 150 ug via INTRATHECAL

## 2024-05-24 MED ORDER — ORAL CARE MOUTH RINSE: 15.0000 mL | Freq: Once | OROMUCOSAL | Status: AC

## 2024-05-24 MED ORDER — ACETAMINOPHEN 10 MG/ML IV SOLN
INTRAVENOUS | Status: DC | PRN
  Administered 2024-05-24: 1000 mg via INTRAVENOUS

## 2024-05-24 MED ORDER — CHLORHEXIDINE GLUCONATE 0.12 % MT SOLN
15.0000 mL | Freq: Once | OROMUCOSAL | Status: AC
  Administered 2024-05-24: 15 mL via OROMUCOSAL

## 2024-05-24 MED ORDER — MEDROXYPROGESTERONE ACETATE 150 MG/ML IM SUSP: 150.0000 mg | INTRAMUSCULAR | Status: DC | PRN

## 2024-05-24 MED ORDER — AMISULPRIDE (ANTIEMETIC) 5 MG/2ML IV SOLN: 10.0000 mg | Freq: Once | INTRAVENOUS | Status: DC | PRN

## 2024-05-24 MED ORDER — GABAPENTIN 100 MG PO CAPS
100.0000 mg | ORAL_CAPSULE | Freq: Three times a day (TID) | ORAL | Status: DC
  Administered 2024-05-24 – 2024-05-26 (×6): 100 mg via ORAL
  Filled 2024-05-24 (×6): qty 1

## 2024-05-24 MED ORDER — ONDANSETRON HCL 4 MG/2ML IJ SOLN: 4.0000 mg | Freq: Once | INTRAMUSCULAR | Status: DC | PRN

## 2024-05-24 MED ORDER — ACETAMINOPHEN 500 MG PO TABS
1000.0000 mg | ORAL_TABLET | Freq: Four times a day (QID) | ORAL | Status: DC
  Administered 2024-05-24 – 2024-05-26 (×6): 1000 mg via ORAL
  Filled 2024-05-24 (×7): qty 2

## 2024-05-24 MED ORDER — MORPHINE SULFATE (PF) 0.5 MG/ML IJ SOLN
INTRAMUSCULAR | Status: AC
  Filled 2024-05-24: qty 10

## 2024-05-24 MED ORDER — DIPHENHYDRAMINE HCL 25 MG PO CAPS: 25.0000 mg | ORAL_CAPSULE | ORAL | Status: DC | PRN

## 2024-05-24 MED ORDER — ONDANSETRON HCL 4 MG/2ML IJ SOLN: 4.0000 mg | Freq: Three times a day (TID) | INTRAMUSCULAR | Status: DC | PRN

## 2024-05-24 MED ORDER — SENNOSIDES-DOCUSATE SODIUM 8.6-50 MG PO TABS
2.0000 | ORAL_TABLET | Freq: Every day | ORAL | Status: DC
  Administered 2024-05-25 – 2024-05-26 (×2): 2 via ORAL
  Filled 2024-05-24 (×2): qty 2

## 2024-05-24 MED ORDER — WITCH HAZEL-GLYCERIN EX PADS: 1.0000 | MEDICATED_PAD | CUTANEOUS | Status: DC | PRN

## 2024-05-24 MED ORDER — STERILE WATER FOR IRRIGATION IR SOLN
Status: DC | PRN
  Administered 2024-05-24: 1000 mL

## 2024-05-24 MED ORDER — ONDANSETRON HCL 4 MG/2ML IJ SOLN
INTRAMUSCULAR | Status: DC | PRN
  Administered 2024-05-24: 4 mg via INTRAVENOUS

## 2024-05-24 MED ORDER — TETANUS-DIPHTH-ACELL PERTUSSIS 5-2-15.5 LF-MCG/0.5 IM SUSP: 0.5000 mL | Freq: Once | INTRAMUSCULAR | Status: DC

## 2024-05-24 MED ORDER — OXYTOCIN-SODIUM CHLORIDE 30-0.9 UT/500ML-% IV SOLN: 2.5000 [IU]/h | INTRAVENOUS | Status: AC

## 2024-05-24 MED ORDER — MEPERIDINE HCL 25 MG/ML IJ SOLN: 6.2500 mg | INTRAMUSCULAR | Status: DC | PRN

## 2024-05-24 MED ORDER — DIPHENHYDRAMINE HCL 50 MG/ML IJ SOLN
12.5000 mg | INTRAMUSCULAR | Status: DC | PRN
  Administered 2024-05-24: 12.5 mg via INTRAVENOUS

## 2024-05-24 SURGICAL SUPPLY — 27 items
CHLORAPREP W/TINT 26 (MISCELLANEOUS) ×2 IMPLANT
CLAMP UMBILICAL CORD (MISCELLANEOUS) ×1 IMPLANT
CLOTH BEACON ORANGE TIMEOUT ST (SAFETY) ×1 IMPLANT
DERMABOND ADVANCED .7 DNX12 (GAUZE/BANDAGES/DRESSINGS) ×2 IMPLANT
DRSG OPSITE POSTOP 4X10 (GAUZE/BANDAGES/DRESSINGS) ×1 IMPLANT
ELECTRODE REM PT RTRN 9FT ADLT (ELECTROSURGICAL) ×1 IMPLANT
EXTRACTOR VACUUM M CUP 4 TUBE (SUCTIONS) IMPLANT
GLOVE BIOGEL PI IND STRL 7.0 (GLOVE) ×2 IMPLANT
GLOVE BIOGEL PI IND STRL 7.5 (GLOVE) ×2 IMPLANT
GLOVE ECLIPSE 7.5 STRL STRAW (GLOVE) ×1 IMPLANT
GOWN STRL REUS W/TWL LRG LVL3 (GOWN DISPOSABLE) ×3 IMPLANT
KIT ABG SYR 3ML LUER SLIP (SYRINGE) IMPLANT
MAT PREVALON FULL STRYKER (MISCELLANEOUS) IMPLANT
NEEDLE HYPO 25X5/8 SAFETYGLIDE (NEEDLE) IMPLANT
NS IRRIG 1000ML POUR BTL (IV SOLUTION) ×1 IMPLANT
PACK C SECTION WH (CUSTOM PROCEDURE TRAY) ×1 IMPLANT
PAD OB MATERNITY 4.3X12.25 (PERSONAL CARE ITEMS) ×1 IMPLANT
RETRACTOR TRAXI PANNICULUS (MISCELLANEOUS) IMPLANT
RTRCTR C-SECT PINK 25CM LRG (MISCELLANEOUS) ×1 IMPLANT
SUT MNCRL 0 VIOLET CTX 36 (SUTURE) ×2 IMPLANT
SUT VIC AB 0 CTX36XBRD ANBCTRL (SUTURE) ×1 IMPLANT
SUT VIC AB 2-0 CT1 TAPERPNT 27 (SUTURE) ×1 IMPLANT
SUT VIC AB 4-0 KS 27 (SUTURE) ×1 IMPLANT
SUTURE PLAIN GUT 2.0 ETHICON (SUTURE) IMPLANT
TOWEL OR 17X24 6PK STRL BLUE (TOWEL DISPOSABLE) ×1 IMPLANT
TRAY FOLEY W/BAG SLVR 14FR LF (SET/KITS/TRAYS/PACK) ×1 IMPLANT
WATER STERILE IRR 1000ML POUR (IV SOLUTION) ×1 IMPLANT

## 2024-05-24 NOTE — H&P (Signed)
 Faculty Practice H&P  Jillian Mcpherson is a 34 y.o. female G2P1001 with IUP at [redacted]w[redacted]d presenting for repeat cesarean section. Pregnancy was been complicated by marijuana use, tobacco use, h/o of chronic HTN.    Pt states she has been having no contractions, no vaginal bleeding, intact membranes, with normal fetal movement.     Prenatal Course Source of Care: [facility] with onset of care at 13 weeks  Pregnancy complications or risks: Patient Active Problem List   Diagnosis Date Noted   Status post cesarean delivery 05/24/2024   Supervision of normal pregnancy 11/25/2023   Marijuana use 09/16/2023   Tobacco use 05/20/2023   Lumbar back pain 12/20/2022   Nausea 10/22/2021   History of chronic hypertension 03/01/2019   Anxiety and depression 03/01/2019   S/P C-section 11/18/2013   She desires IUD for contraception.  She plans to bottle feed  Prenatal labs and studies: ABO, Rh: --/--/A POS (01/02 1005) Antibody: NEG (01/02 1005) Rubella: 5.93 (07/08 1216) RPR: NON REACTIVE (01/02 1030)  HBsAg: Negative (07/08 1216)  HIV: Non Reactive (07/08 1216)  GBS: Positive/-- (12/30 0000)  2hr Glucola: negative Genetic screening: normal Anatomy US : normal  Past Medical History:  Past Medical History:  Diagnosis Date   ADD (attention deficit disorder)    Anxiety    Depression    Hypertension     Past Surgical History:  Past Surgical History:  Procedure Laterality Date   CESAREAN SECTION N/A 11/18/2013   Procedure: CESAREAN SECTION;  Surgeon: Vonn VEAR Inch, MD;  Location: [facility];  Service: Obstetrics;  Laterality: N/A;   CHOLECYSTECTOMY N/A 01/03/2014   Procedure: LAPAROSCOPIC CHOLECYSTECTOMY;  Surgeon: Oneil DELENA Budge, MD;  Location: [facility];  Service: General;  Laterality: N/A;   TONSILLECTOMY      Obstetrical History:  OB History     Gravida  2   Para  1   Term  1   Preterm      AB      Living  1      SAB      IAB      Ectopic      Multiple      Live Births  1            Gynecological History:  OB History     Gravida  2   Para  1   Term  1   Preterm      AB      Living  1      SAB      IAB      Ectopic      Multiple      Live Births  1           Social History:  Social History   Socioeconomic History   Marital status: Married    Spouse name: Not on file   Number of children: 1   Years of education: Not on file   Highest education level: Not on file  Occupational History   Not on file  Tobacco Use   Smoking status: Every Day    Current packs/day: 1.00    Average packs/day: 1 pack/day for 3.1 years (3.1 ttl pk-yrs)    Types: Cigarettes    Start date: 01/01/2008    Last attempt to quit: 01/01/2011   Smokeless tobacco: Never  Vaping Use   Vaping status: Former   Quit date: 05/20/2023  Substance and Sexual Activity   Alcohol use: Yes  Drug use: Yes    Types: Marijuana    Comment: last smoked on 12.28   Sexual activity: Yes    Birth control/protection: None  Other Topics Concern   Not on file  Social History Narrative   Not on file   Social Drivers of Health   Tobacco Use: High Risk (05/24/2024)   Patient History    Smoking Tobacco Use: Every Day    Smokeless Tobacco Use: Never    Passive Exposure: Not on file  Financial Resource Strain: Low Risk (11/25/2023)   Overall Financial Resource Strain (CARDIA)    Difficulty of Paying Living Expenses: Not very hard  Recent Concern: Financial Resource Strain - Medium Risk (10/07/2023)   Overall Financial Resource Strain (CARDIA)    Difficulty of Paying Living Expenses: Somewhat hard  Food Insecurity: No Food Insecurity (05/24/2024)   Epic    Worried About Radiation Protection Practitioner of Food in the Last Year: Never true    Ran Out of Food in the Last Year: Never true  Transportation Needs: No Transportation Needs (05/24/2024)   Epic    Lack of Transportation (Medical): No    Lack of Transportation (Non-Medical): No  Physical Activity: Inactive (11/25/2023)   Exercise Vital  Sign    Days of Exercise per Week: 0 days    Minutes of Exercise per Session: 0 min  Stress: Stress Concern Present (11/25/2023)   [employer] of Occupational Health - Occupational Stress Questionnaire    Feeling of Stress: To some extent  Social Connections: Moderately Isolated (05/24/2024)   Social Connection and Isolation Panel    Frequency of Communication with Friends and Family: Twice a week    Frequency of Social Gatherings with Friends and Family: Once a week    Attends Religious Services: Never    Database Administrator or Organizations: No    Attends Banker Meetings: Never    Marital Status: Married  Depression (PHQ2-9): Low Risk (11/25/2023)   Depression (PHQ2-9)    PHQ-2 Score: 4  Recent Concern: Depression (PHQ2-9) - Medium Risk (10/10/2023)   Depression (PHQ2-9)    PHQ-2 Score: 5  Alcohol Screen: Low Risk (11/25/2023)   Alcohol Screen    Last Alcohol Screening Score (AUDIT): 0  Housing: Unknown (05/24/2024)   Epic    Unable to Pay for Housing in the Last Year: No    Number of Times Moved in the Last Year: Not on file    Homeless in the Last Year: No  Utilities: Not At Risk (05/24/2024)   Epic    Threatened with loss of utilities: No  Health Literacy: Adequate Health Literacy (11/25/2023)   B1300 Health Literacy    Frequency of need for help with medical instructions: Never    Family History:  Family History  Problem Relation Age of Onset   Heart disease Mother    Hypertension Father    Asthma Father    Heart disease Maternal Grandmother    COPD Paternal Grandmother    Cancer Other        great grandmother    Medications:  Prenatal vitamins,  Current Facility-Administered Medications  Medication Dose Route Frequency Provider Last Rate Last Admin   ceFAZolin  (ANCEF ) IVPB 2g/100 mL premix  2 g Intravenous On Call to OR Dallis Darden J, DO       lactated ringers  infusion   Intravenous Continuous Corinne Garnette BRAVO, MD       lactated ringers  infusion    Intravenous Continuous Zakaria Fromer J,  DO 125 mL/hr at 05/24/24 1050 Continued from Pre-op at 05/24/24 1050   scopolamine  (TRANSDERM-SCOP) 1 MG/3DAYS 1 mg  1 patch Transdermal Once Corinne Garnette BRAVO, MD   1 mg at 05/24/24 1017    Allergies: Allergies[1]  Review of Systems: - negative  Physical Exam: Blood pressure (!) 127/95, pulse 84, temperature 97.7 F (36.5 C), temperature source Oral, resp. rate 16, height 5' 4 (1.626 m), weight 94 kg, SpO2 95%. GENERAL: Well-developed, well-nourished female in no acute distress.  LUNGS: Clear to auscultation bilaterally.  HEART: Regular rate and rhythm. ABDOMEN: Soft, nontender, nondistended, gravid. EXTREMITIES: Nontender, no edema, 2+ distal pulses.    Pertinent Labs/Studies:   Lab Results  Component Value Date   WBC 18.4 (H) 05/21/2024   HGB 12.5 05/21/2024   HCT 37.6 05/21/2024   MCV 86.0 05/21/2024   PLT 443 (H) 05/21/2024    Assessment : Jillian Mcpherson is a 34 y.o. G2P1001 at [redacted]w[redacted]d being admitted for cesarean section secondary to elective repeat  Plan: The risks of cesarean section discussed with the patient included but were not limited to: bleeding which may require transfusion or reoperation; infection which may require antibiotics; injury to bowel, bladder, ureters or other surrounding organs; injury to the fetus; need for additional procedures including hysterectomy in the event of a life-threatening hemorrhage; placental abnormalities wth subsequent pregnancies, incisional problems, thromboembolic phenomenon and other postoperative/anesthesia complications. The patient concurred with the proposed plan, giving informed written consent for the procedure.   Patient has been NPO since last night and will remain NPO for procedure.  Preoperative prophylactic Ancef  ordered on call to the OR.    Navon Kotowski J, DO 05/24/2024, 11:39 AM        [1] No Known Allergies

## 2024-05-24 NOTE — Discharge Summary (Signed)
 "    Postpartum Discharge Summary  Date of Service updated***     Patient Name: Jillian Mcpherson DOB: [birth year] MRN: [id number]  Date of admission: 05/24/2024 Delivery date:05/24/2024 Delivering provider: BARBRA LANG PARAS Date of discharge: 05/24/2024  Admitting diagnosis: Encounter for maternal care for low transverse scar from previous cesarean delivery [O34.211] Pregnancy [Z34.90] Intrauterine pregnancy: [redacted]w[redacted]d     Secondary diagnosis:  Principal Problem:   Pregnancy Active Problems:   Status post cesarean delivery  Additional problems: ***    Discharge diagnosis: Term Pregnancy Delivered                                              Post partum procedures:{Postpartum procedures:23558} Augmentation: N/A Complications: None  Hospital course: Sceduled C/S   Jillian Mcpherson at [redacted]w[redacted]d was admitted to the hospital 05/24/2024 for scheduled cesarean section with the following indication:Elective Primary.Delivery details are as follows:  Membrane Rupture Time/Date: 12:32 PM,05/24/2024  Delivery Method:C-Section, Low Transverse Operative Delivery:N/A Details of operation can be found in separate operative note.  Patient had a postpartum course complicated by***.  She is ambulating, tolerating a regular diet, passing flatus, and urinating well. Patient is discharged home in stable condition on  05/24/2024        Newborn Data: Birth date:05/24/2024 Birth time:12:34 PM Gender:Female Living status:Living Apgars:8 ,9  Weight:3560 g    Magnesium  Sulfate received: {Mag received:30440022} BMZ received: No Rhophylac:N/A MMR:N/A T-DaP:{Tdap:23962} Flu: {Qol:76036} RSV Vaccine received: {RSV:31013} Transfusion:{Transfusion received:30440034}  Immunizations received: Immunization History  Administered Date(s) Administered   Tdap 12/07/2013    Physical exam  Vitals:   05/24/24 1428 05/24/24 1533 05/24/24 1644 05/24/24 1837  BP: 131/86 120/69 123/62 120/60  Pulse: 61 74 69 60  Resp: 18 18 18 18    Temp: 97.9 F (Jillian.6 C)  97.6 F (Jillian.4 C)   TempSrc: Oral  Oral   SpO2: 99% 99% 100% 98%  Weight:      Height:       General: {Exam; general:21111117} Lochia: {Desc; appropriate/inappropriate:30686::appropriate} Uterine Fundus: {Desc; firm/soft:30687} Incision: {Exam; incision:21111123} DVT Evaluation: {Exam; dvt:2111122} Labs: Lab Results  Component Value Date   WBC 18.4 (H) 05/21/2024   HGB 12.5 05/21/2024   HCT 37.6 05/21/2024   MCV 86.0 05/21/2024   PLT 443 (H) 05/21/2024      Latest Ref Rng & Units 11/25/2023   12:17 PM  CMP  Glucose 70 - 99 mg/dL 93   BUN 6 - 20 mg/dL 8   Creatinine 9.42 - 8.99 mg/dL 9.48   Sodium 865 - 855 mmol/L 138   Potassium 3.5 - 5.2 mmol/L 4.0   Chloride 96 - 106 mmol/L 103   CO2 20 - 29 mmol/L 19   Calcium 8.7 - 10.2 mg/dL 9.1   Total Protein 6.0 - 8.5 g/dL 6.6   Total Bilirubin 0.0 - 1.2 mg/dL <9.7   Alkaline Phos 44 - 121 IU/L 70   AST 0 - 40 IU/L 14   ALT 0 - 32 IU/L 9    Edinburgh Score:     No data to display         No data recorded  After visit meds:  Allergies as of 05/24/2024   No Known Allergies   Med Rec must be completed prior to using this [facility]***        Discharge home in stable condition  Infant Feeding: {Baby feeding:23562} Infant Disposition:{CHL IP OB HOME WITH FNUYZM:76418} Discharge instruction: per After Visit Summary and Postpartum booklet. Activity: Advance as tolerated. Pelvic rest for 6 weeks.  Diet: {OB ipzu:78888878} Future Appointments: Future Appointments  Date Time Provider Department Center  05/31/2024  3:50 PM Kizzie Suzen SAUNDERS, CNM [facility]   Follow up Visit: Message sent 1/5  Please schedule this patient for a In person postpartum visit in 6 weeks with the following provider: Any provider. Additional Postpartum F/U:Incision check 1 week  Low risk pregnancy complicated by: tob use Delivery mode:  C-Section, Low Transverse Anticipated Birth Control:  IUD  OP   05/24/2024 Barabara Maier, DO    "

## 2024-05-24 NOTE — Anesthesia Preprocedure Evaluation (Signed)
"                                    Anesthesia Evaluation  Patient identified by MRN, date of birth, ID band Patient awake    Reviewed: Allergy & Precautions, NPO status , Patient's Chart, lab work & pertinent test results  Airway Mallampati: II  TM Distance: >3 FB Neck ROM: Full    Dental  (+) Teeth Intact, Dental Advisory Given   Pulmonary Current SmokerPatient did not abstain from smoking.   Pulmonary exam normal breath sounds clear to auscultation       Cardiovascular negative cardio ROS Normal cardiovascular exam Rhythm:Regular Rate:Normal     Neuro/Psych  PSYCHIATRIC DISORDERS Anxiety Depression    negative neurological ROS     GI/Hepatic negative GI ROS, Neg liver ROS,,,(+)     substance abuse  marijuana use  Endo/Other  Obesity   Renal/GU negative Renal ROS     Musculoskeletal negative musculoskeletal ROS (+)    Abdominal   Peds  Hematology negative hematology ROS (+) Plt 443k   Anesthesia Other Findings Day of surgery medications reviewed with the patient.  Reproductive/Obstetrics (+) Pregnancy H/o C-section x1                              Anesthesia Physical Anesthesia Plan  ASA: 3  Anesthesia Plan: Spinal   Post-op Pain Management:    Induction:   PONV Risk Score and Plan: 2 and Scopolamine  patch - Pre-op, Dexamethasone  and Ondansetron   Airway Management Planned: Natural Airway  Additional Equipment:   Intra-op Plan:   Post-operative Plan:   Informed Consent: I have reviewed the patients History and Physical, chart, labs and discussed the procedure including the risks, benefits and alternatives for the proposed anesthesia with the patient or authorized representative who has indicated his/her understanding and acceptance.     Dental advisory given  Plan Discussed with: CRNA, Anesthesiologist and Surgeon  Anesthesia Plan Comments: (Discussed risks and benefits of and differences between  spinal and general. Discussed risks of spinal including headache, backache, failure, bleeding, infection, and nerve damage. Patient consents to spinal. Questions answered. Coagulation studies and platelet count acceptable.)        Anesthesia Quick Evaluation  "

## 2024-05-24 NOTE — Transfer of Care (Signed)
 Immediate Anesthesia Transfer of Care Note  Patient: Jillian Mcpherson  Procedure(s) Performed: CESAREAN DELIVERY (Abdomen)  Patient Location: PACU  Anesthesia Type:Spinal  Level of Consciousness: awake  Airway & Oxygen Therapy: Patient Spontanous Breathing  Post-op Assessment: Report given to RN and Post -op Vital signs reviewed and stable  Post vital signs: Reviewed and stable  Last Vitals:  Vitals Value Taken Time  BP 123/79 05/24/24 13:45  Temp 36.4 C 05/24/24 13:24  Pulse 57 05/24/24 13:56  Resp 15 05/24/24 13:56  SpO2 99 % 05/24/24 13:56  Vitals shown include unfiled device data.  Last Pain:  Vitals:   05/24/24 1345  TempSrc:   PainSc: 0-No pain         Complications: No notable events documented.

## 2024-05-24 NOTE — Anesthesia Procedure Notes (Signed)
 Spinal  Patient location during procedure: OR Start time: 05/24/2024 11:59 AM End time: 05/24/2024 12:02 PM Reason for block: surgical anesthesia  Staffing Performed: anesthesiologist  Authorized by: Corinne Garnette BRAVO, MD   Performed by: Corinne Garnette BRAVO, MD  Preanesthetic Checklist Completed: patient identified, IV checked, risks and benefits discussed, surgical consent, monitors and equipment checked, pre-op evaluation and timeout performed Spinal Block Patient position: sitting Prep: DuraPrep and site prepped and draped Patient monitoring: continuous pulse ox and blood pressure Approach: midline Location: L3-4 Injection technique: single-shot Needle Needle type: Pencan  Needle gauge: 24 G Assessment Events: CSF return  Additional Notes Functioning IV was confirmed and monitors were applied. Sterile prep and drape, including hand hygiene, mask and sterile gloves were used. The patient was positioned and the spine was prepped. The skin was anesthetized with lidocaine .  Free flow of clear CSF was obtained prior to injecting local anesthetic into the CSF.  The spinal needle aspirated freely following injection.  The needle was carefully withdrawn.  The patient tolerated the procedure well. Consent was obtained prior to procedure with all questions answered and concerns addressed. Risks including but not limited to bleeding, infection, nerve damage, paralysis, failed block, inadequate analgesia, allergic reaction, high spinal, itching and headache were discussed and the patient wished to proceed.   Garnette Corinne, MD

## 2024-05-24 NOTE — Op Note (Signed)
 Cesarean Section Operative Note   Patient: Jillian Mcpherson  Date of Procedure: 05/24/2024  Procedure: Repeat Low Transverse Cesarean   Indications: patient declines vag del attempt, elective repeat, previous uterine incision: low transverse  Pre-operative Diagnosis: previous cesarean  section.   Post-operative Diagnosis: Same  TOLAC Candidate: Yes   Surgeon: Surgeons and Role:    * Barbra Lang PARAS, DO - Primary    * Danny Geralds, DO - Fellow  Assistants: An experienced assistant was required given the standard of surgical care given the complexity of the case.  This assistant was needed for exposure, dissection, suctioning, retraction, instrument exchange, assisting with delivery with administration of fundal pressure, and for overall help during the procedure.   Anesthesia: spinal  Anesthesiologist: Corinne Garnette BRAVO, MD   Antibiotics: Cefazolin    Estimated Blood Loss: 163 ml   Total IV Fluids: 2000 ml  Urine Output: 50 cc OF clear urine  Specimens: None   Complications: no complications   Indications: Jillian Mcpherson is a 34 y.o. [id number] with an IUP [redacted]w[redacted]d presenting for scheduled cesarean secondary to the indications listed above. Clinical course notable for none.  The risks of cesarean section discussed with the patient included but were not limited to: bleeding which may require transfusion or reoperation; infection which may require antibiotics; injury to bowel, bladder, ureters or other surrounding organs; injury to the fetus; need for additional procedures including hysterectomy in the event of a life-threatening hemorrhage; placental abnormalities with subsequent pregnancies, incisional problems, thromboembolic phenomenon and other postoperative/anesthesia complications. The patient concurred with the proposed plan, giving informed written consent for the procedure. Patient has been NPO since last night she will remain NPO for procedure. Anesthesia and OR aware. Preoperative  prophylactic antibiotics and SCDs ordered on call to the OR.  supine  Findings: Viable infant in cephalic presentation, nuchal x1. Apgars 8, 9, . Weight 3560 g. Light meconium stained amniotic fluid. Normal placenta, three vessel cord. Normal uterus, Normal bilateral fallopian tubes, Normal bilateral ovaries. No adhesive disease was encountered.  Procedure Details: A Time Out was held and the above information confirmed. The patient received intravenous antibiotics and had sequential compression devices applied to her lower extremities preoperatively. The patient was taken back to the operative suite where spinal anesthesia was administered. After induction of anesthesia, the patient was draped and prepped in the usual sterile manner and placed in a dorsal supine position with a leftward tilt. A low transverse skin incision was made with scalpel and carried down through the subcutaneous tissue to the fascia. Fascial incision was made and extended transversely. The fascia was separated from the underlying rectus tissue superiorly and inferiorly. The rectus muscles were separated in the midline bluntly and the peritoneum was entered bluntly. An Alexis retractor was placed to aid in visualization of the uterus. The utero-vesical peritoneal reflection was incised transversely and the bladder flap was bluntly freed from the lower uterine segment. A low transverse uterine incision was made. The infant was successfully delivered from cephalic presentation, the umbilical cord was clamped after 1 minute. Cord ph was not sent, and cord blood was obtained for evaluation. The placenta was removed Intact and appeared normal. The uterine incision was closed with a single layer running unlocked suture of 0-Monocryl. Overall, excellent hemostasis was noted. The abdomen and the pelvis were cleared of all clot and debris and the Thersia was removed. Hemostasis was confirmed on all surfaces.  The peritoneum was reapproximated  using 2-0 vicryl . The fascia was  then closed using 0 Vicryl in a running fashion. The subcutaneous layer was reapproximated with 2-0 plain gut suture. The skin was closed with a 4-0 vicryl subcuticular stitch. The patient tolerated the procedure well. Sponge, lap, instrument and needle counts were correct x 2. She was taken to the recovery room in stable condition.  Disposition: PACU - hemodynamically stable.    Signed: Barabara Maier, DO FM-OB Fellow [facility]

## 2024-05-24 NOTE — Anesthesia Postprocedure Evaluation (Signed)
"   Anesthesia Post Note  Patient: Jillian Mcpherson  Procedure(s) Performed: CESAREAN DELIVERY (Abdomen)     Patient location during evaluation: PACU Anesthesia Type: Spinal Level of consciousness: awake, awake and alert and oriented Pain management: pain level controlled Vital Signs Assessment: post-procedure vital signs reviewed and stable Respiratory status: spontaneous breathing, nonlabored ventilation and respiratory function stable Cardiovascular status: blood pressure returned to baseline and stable Postop Assessment: no headache, no backache, spinal receding and no apparent nausea or vomiting Anesthetic complications: no   No notable events documented.  Last Vitals:  Vitals:   05/24/24 1428 05/24/24 1533  BP: 131/86 120/69  Pulse: 61 74  Resp: 18 18  Temp: 36.6 C   SpO2: 99% 99%    Last Pain:  Vitals:   05/24/24 1431  TempSrc:   PainSc: 0-No pain   Pain Goal:                   Garnette FORBES Skillern      "

## 2024-05-25 LAB — CBC
HCT: 32 % — ABNORMAL LOW (ref 36.0–46.0)
Hemoglobin: 10.9 g/dL — ABNORMAL LOW (ref 12.0–15.0)
MCH: 28.5 pg (ref 26.0–34.0)
MCHC: 34.1 g/dL (ref 30.0–36.0)
MCV: 83.8 fL (ref 80.0–100.0)
Platelets: 413 K/uL — ABNORMAL HIGH (ref 150–400)
RBC: 3.82 MIL/uL — ABNORMAL LOW (ref 3.87–5.11)
RDW: 14.5 % (ref 11.5–15.5)
WBC: 20 K/uL — ABNORMAL HIGH (ref 4.0–10.5)
nRBC: 0 % (ref 0.0–0.2)

## 2024-05-25 NOTE — Clinical Social Work Maternal (Signed)
 " CLINICAL SOCIAL WORK MATERNAL/CHILD NOTE  Patient Details  Name: Jillian Mcpherson MRN: [id number] Date of Birth: [birth year]  Date:  05/25/2024  Clinical Social Worker Initiating Note:  Rosina Molt Date/Time: Initiated:  05/25/24/1407     Child's Name:  Jillian Mcpherson   Biological Parents:  Mother, Father (Jillian Mcpherson 06/11/88 Jillian Mcpherson 04-20-1990)   Need for Interpreter:  None   Reason for Referral:  Current Substance Use/Substance Use During Pregnancy     Address:  [address] [place] [address]    Phone number:  [phone] (home) [phone] (work)    Additional phone number:   Household Members/Support Persons (HM/SP):   Household Member/Support Person 1, Household Member/Support Person 2   HM/SP Name Relationship DOB or Age  HM/SP -1 Jillian Mcpherson FOB 04-20-1990  HM/SP -2 Jillian Mcpherson Daughter 11-18-2013  HM/SP -3        HM/SP -4        HM/SP -5        HM/SP -6        HM/SP -7        HM/SP -8          Natural Supports (not living in the home):  Spouse/significant other, Immediate Family   Professional Supports: None   Employment: Unemployed   Type of Work:     Education:  Engineer, agricultural   Homebound arranged:    Surveyor, Quantity Resources:      Other Resources:      Cultural/Religious Considerations Which May Impact Care:    Strengths:  Ability to meet basic needs  , Home prepared for child  , Pediatrician chosen   Psychotropic Medications:         Pediatrician:    [facility]  Pediatrician List:   [facility]  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 CSW met MOB at bedside to complete a full psychosocial assessment. CSW entered the room, introduced herself and acknowledged her family was present. CSW asked MOB for privacy reasons could her guest stepout for the assessment; MOB was agreeable and her guest stepped out. CSW explained her role and the reason for the visit. MOB presented bonding/holding the infant as they lay safely in the bassinet. MOB was polite, easy to engage, receptive to meeting with CSW, and appeared forthcoming.  CSW collected MOB's demographic information and inquired about mental health history. MOB reported being diagnosed with anxiety and depression in 2024. MOB reported being sick for a while and that lead to persistent vomiting. MOB reported going to the her PCP and she was diagnosed with anxiety/depression. MOB reported she was prescribed medication and changed to Zoloft  once they decided to have a baby. MOB reported her current regiment with Zoloft  50mg  and finds the medication supportive. MOB reported she struggles with taking the medication everyday and when she misses a dose she notices a difference in her  mood. CSW encouraged MOB to set an alarm on her phone to help for her to remember; MOB was understanding. MOB reported participating in therapy with  Jillian Mcpherson with [facility] during pregnancy. MOB reported Jillian was helpful during her sessions; however he reccommended group therapy in support of her trying to quit THC. MOB reported she did not want to attend group therapy due to being nervous and she felt like her THC use was not a big deal compared to the other members. CSW encouraged MOB to try the group sessions and see if they are supportive; MOB was understanding. MOB reported she would return to therapist Jillian Mcpherson if needed during the PP period. 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 MOB reported her PPD was never diagnosed and she experienced crashing in her hormones, tiredness and upset. MOB reported she never reached out for help and just toughed it out.  CSW informed MOB due to [facility] during her pregnancy; the hospital will perform a UDS and CDS on the infant. If the screenings return with positive results a report to CPS will be made; MOB was understanding. MOB reported her last use was one week ago prior to delivery of the infant. MOB reported the reason for use was due to constant nausea and helps calm her anxiety. 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  CSW will continue to monitor the CDS/UDS and complete a CPS report if warranted.  CSW Plan/Description:   No Further Intervention Required/No Barriers to Discharge, Sudden Infant Death Syndrome (SIDS) Education, Perinatal Mood and Anxiety Disorder (PMADs) Education, Hospital Drug Screen Policy Information, Other Information/Referral to [facility], CSW Will Continue to Monitor Umbilical Cord Tissue Drug Screen Results and Make Report if Jillian Rosina MARLA Joshua, LCSW 05/25/2024, 2:11 PM  "

## 2024-05-25 NOTE — Progress Notes (Signed)
 POSTPARTUM PROGRESS NOTE  Post Partum Day 1  Subjective:  Jillian Mcpherson is a 34 y.o. [id number] s/p RLTCS at [redacted]w[redacted]d.  She reports she is doing well. No acute events overnight. She denies any problems with ambulating, voiding or po intake. Denies nausea or vomiting.  Pain is well controlled.  Lochia is Normal.  Objective: Blood pressure 132/89, pulse (!) 55, temperature (!) 97.5 F (36.4 C), temperature source Oral, resp. rate 16, height 5' 4 (1.626 m), weight 94 kg, SpO2 100%, unknown if currently breastfeeding.  BP Readings from Last 3 Encounters:  05/25/24 132/89  05/18/24 126/84  04/22/24 114/79    Physical Exam:  General: alert, cooperative and no distress Chest: no respiratory distress Heart:regular rate, distal pulses intact Uterine Fundus: firm, appropriately tender DVT Evaluation: No calf swelling or tenderness Extremities: no edema Skin: warm, dry  Recent Labs    05/25/24 0448  HGB 10.9*  HCT 32.0*    Assessment/Plan: Jillian Mcpherson is a 34 y.o. [id number] s/p RCS at [redacted]w[redacted]d   PPD# 1 - Doing well  Routine postpartum care  Delivery Complications: none Blood Pressure: normal Hb Status: Stable, appropriate postpartum Hb, no intervention indicated Contraception: Outpatient IUD Feeding: bottle feeding Needs Lactation Consultation: No   Dispo: Plan for discharge tomorrow.   LOS: 1 day   Charlie DELENA Courts, MD OB Fellow  05/25/2024, 9:29 AM

## 2024-05-26 ENCOUNTER — Other Ambulatory Visit (HOSPITAL_COMMUNITY): Payer: Self-pay

## 2024-05-26 MED ORDER — ACETAMINOPHEN 500 MG PO TABS
1000.0000 mg | ORAL_TABLET | Freq: Four times a day (QID) | ORAL | 0 refills | Status: AC
  Filled 2024-05-26: qty 30, 4d supply, fill #0

## 2024-05-26 MED ORDER — GABAPENTIN 100 MG PO CAPS
100.0000 mg | ORAL_CAPSULE | Freq: Three times a day (TID) | ORAL | 0 refills | Status: AC
  Filled 2024-05-26: qty 21, 7d supply, fill #0

## 2024-05-26 MED ORDER — POTASSIUM CHLORIDE CRYS ER 20 MEQ PO TBCR
20.0000 meq | EXTENDED_RELEASE_TABLET | Freq: Every day | ORAL | Status: DC
  Administered 2024-05-26: 20 meq via ORAL
  Filled 2024-05-26: qty 1

## 2024-05-26 MED ORDER — POTASSIUM CHLORIDE CRYS ER 20 MEQ PO TBCR
20.0000 meq | EXTENDED_RELEASE_TABLET | Freq: Every day | ORAL | 0 refills | Status: AC
  Filled 2024-05-26: qty 5, 5d supply, fill #0

## 2024-05-26 MED ORDER — IBUPROFEN 600 MG PO TABS
600.0000 mg | ORAL_TABLET | Freq: Four times a day (QID) | ORAL | 0 refills | Status: AC
  Filled 2024-05-26: qty 30, 8d supply, fill #0

## 2024-05-26 MED ORDER — NIFEDIPINE ER OSMOTIC RELEASE 30 MG PO TB24
30.0000 mg | ORAL_TABLET | Freq: Every day | ORAL | Status: DC
  Administered 2024-05-26: 30 mg via ORAL
  Filled 2024-05-26: qty 1

## 2024-05-26 MED ORDER — OXYCODONE HCL 5 MG PO TABS
5.0000 mg | ORAL_TABLET | ORAL | 0 refills | Status: AC | PRN
  Filled 2024-05-26: qty 30, 3d supply, fill #0

## 2024-05-26 MED ORDER — FUROSEMIDE 20 MG PO TABS
20.0000 mg | ORAL_TABLET | Freq: Every day | ORAL | 0 refills | Status: AC
  Filled 2024-05-26: qty 5, 5d supply, fill #0

## 2024-05-26 MED ORDER — FUROSEMIDE 20 MG PO TABS
20.0000 mg | ORAL_TABLET | Freq: Every day | ORAL | Status: DC
  Administered 2024-05-26: 20 mg via ORAL
  Filled 2024-05-26: qty 1

## 2024-05-26 MED ORDER — NIFEDIPINE ER 30 MG PO TB24
30.0000 mg | ORAL_TABLET | Freq: Every day | ORAL | 1 refills | Status: AC
  Filled 2024-05-26: qty 30, 30d supply, fill #0

## 2024-05-26 NOTE — Clinical Note (Incomplete)
 Patient with elevated BP. Denies headache,

## 2024-05-26 NOTE — Discharge Instructions (Signed)
 SABRA

## 2024-05-26 NOTE — Progress Notes (Signed)
 Subjective: Postpartum Day 2: Cesarean Delivery Patient reports tolerating PO, + flatus, + BM, and no problems voiding.    Objective: Vital signs in last 24 hours: Temp:  [98.3 F (36.8 C)] 98.3 F (36.8 C) (01/07 0504) Pulse Rate:  [71-83] 71 (01/07 0547) Resp:  [17-19] 17 (01/07 0504) BP: (116-149)/(78-109) 136/102 (01/07 0547) SpO2:  [99 %] 99 % (01/07 0504)  Physical Exam:  General: alert, cooperative, and no distress Lochia: appropriate Uterine Fundus: firm Incision: no significant drainage DVT Evaluation: No evidence of DVT seen on physical exam.  Recent Labs    05/25/24 0448  HGB 10.9*  HCT 32.0*    Assessment/Plan: Status post Cesarean section. Postoperative course complicated by new onset elevated blood pressure  Continue current care.  Olam Boards, CNM 05/26/2024, 9:27 AM

## 2024-05-26 NOTE — Patient Instructions (Signed)

## 2024-05-31 ENCOUNTER — Encounter: Payer: Self-pay | Admitting: Women's Health

## 2024-06-01 ENCOUNTER — Telehealth (HOSPITAL_COMMUNITY): Payer: Self-pay

## 2024-06-01 NOTE — Telephone Encounter (Signed)
 06/01/2024 1250  Name: Phoenicia R Florido MRN: [id number] DOB: 19-Oct-1990  Reason for Call:  Transition of Care Hospital Discharge Call  Contact Status: Patient Contact Status: Message  Language assistant needed:          Follow-Up Questions:    Van Postnatal Depression Scale:  In the Past 7 Days:    PHQ2-9 Depression Scale:     Discharge Follow-up:    Post-discharge interventions: NA  Signature  Rosaline Deretha PEAK

## 2024-06-30 ENCOUNTER — Ambulatory Visit: Payer: Self-pay | Admitting: Women's Health
